# Patient Record
Sex: Male | Born: 1970 | Race: Black or African American | Hispanic: No | Smoking: Never smoker
Health system: Southern US, Community
[De-identification: ages and names within clinical notes are randomized; demographics above are authoritative.]

## PROBLEM LIST (undated history)

## (undated) DIAGNOSIS — I10 Essential (primary) hypertension: Secondary | ICD-10-CM

## (undated) DIAGNOSIS — E119 Type 2 diabetes mellitus without complications: Secondary | ICD-10-CM

## (undated) HISTORY — DX: Type 2 diabetes mellitus without complications: E11.9

---

## 1998-06-23 ENCOUNTER — Emergency Department (HOSPITAL_COMMUNITY): Admission: EM | Admit: 1998-06-23 | Discharge: 1998-06-23 | Payer: Self-pay | Admitting: Emergency Medicine

## 1998-06-23 ENCOUNTER — Encounter: Payer: Self-pay | Admitting: Emergency Medicine

## 1998-10-08 ENCOUNTER — Emergency Department (HOSPITAL_COMMUNITY): Admission: EM | Admit: 1998-10-08 | Discharge: 1998-10-08 | Payer: Self-pay | Admitting: Emergency Medicine

## 2001-10-24 ENCOUNTER — Emergency Department (HOSPITAL_COMMUNITY): Admission: EM | Admit: 2001-10-24 | Discharge: 2001-10-24 | Payer: Self-pay | Admitting: *Deleted

## 2001-10-24 ENCOUNTER — Encounter: Payer: Self-pay | Admitting: *Deleted

## 2002-05-22 ENCOUNTER — Ambulatory Visit (HOSPITAL_COMMUNITY): Admission: RE | Admit: 2002-05-22 | Discharge: 2002-05-22 | Payer: Self-pay | Admitting: Family Medicine

## 2002-05-22 ENCOUNTER — Encounter: Payer: Self-pay | Admitting: Family Medicine

## 2003-02-10 ENCOUNTER — Emergency Department (HOSPITAL_COMMUNITY): Admission: EM | Admit: 2003-02-10 | Discharge: 2003-02-10 | Payer: Self-pay | Admitting: Emergency Medicine

## 2003-02-26 ENCOUNTER — Emergency Department (HOSPITAL_COMMUNITY): Admission: EM | Admit: 2003-02-26 | Discharge: 2003-02-26 | Payer: Self-pay | Admitting: Internal Medicine

## 2003-03-09 ENCOUNTER — Emergency Department (HOSPITAL_COMMUNITY): Admission: EM | Admit: 2003-03-09 | Discharge: 2003-03-09 | Payer: Self-pay | Admitting: *Deleted

## 2003-03-09 ENCOUNTER — Encounter: Payer: Self-pay | Admitting: *Deleted

## 2003-03-13 ENCOUNTER — Encounter: Payer: Self-pay | Admitting: *Deleted

## 2003-03-13 ENCOUNTER — Ambulatory Visit (HOSPITAL_COMMUNITY): Admission: RE | Admit: 2003-03-13 | Discharge: 2003-03-13 | Payer: Self-pay | Admitting: *Deleted

## 2004-07-31 ENCOUNTER — Emergency Department (HOSPITAL_COMMUNITY): Admission: EM | Admit: 2004-07-31 | Discharge: 2004-07-31 | Payer: Self-pay | Admitting: Emergency Medicine

## 2005-01-12 ENCOUNTER — Emergency Department (HOSPITAL_COMMUNITY): Admission: EM | Admit: 2005-01-12 | Discharge: 2005-01-12 | Payer: Self-pay | Admitting: Emergency Medicine

## 2005-07-01 ENCOUNTER — Emergency Department (HOSPITAL_COMMUNITY): Admission: EM | Admit: 2005-07-01 | Discharge: 2005-07-01 | Payer: Self-pay | Admitting: Emergency Medicine

## 2005-11-08 ENCOUNTER — Emergency Department (HOSPITAL_COMMUNITY): Admission: EM | Admit: 2005-11-08 | Discharge: 2005-11-08 | Payer: Self-pay | Admitting: Emergency Medicine

## 2005-12-18 ENCOUNTER — Emergency Department (HOSPITAL_COMMUNITY): Admission: EM | Admit: 2005-12-18 | Discharge: 2005-12-18 | Payer: Self-pay | Admitting: Emergency Medicine

## 2006-04-24 ENCOUNTER — Emergency Department (HOSPITAL_COMMUNITY): Admission: EM | Admit: 2006-04-24 | Discharge: 2006-04-24 | Payer: Self-pay | Admitting: Emergency Medicine

## 2006-08-28 ENCOUNTER — Emergency Department (HOSPITAL_COMMUNITY): Admission: EM | Admit: 2006-08-28 | Discharge: 2006-08-28 | Payer: Self-pay | Admitting: Emergency Medicine

## 2007-01-08 ENCOUNTER — Emergency Department (HOSPITAL_COMMUNITY): Admission: EM | Admit: 2007-01-08 | Discharge: 2007-01-08 | Payer: Self-pay | Admitting: Emergency Medicine

## 2007-03-27 ENCOUNTER — Emergency Department (HOSPITAL_COMMUNITY): Admission: EM | Admit: 2007-03-27 | Discharge: 2007-03-27 | Payer: Self-pay | Admitting: Emergency Medicine

## 2007-09-04 ENCOUNTER — Emergency Department (HOSPITAL_COMMUNITY): Admission: EM | Admit: 2007-09-04 | Discharge: 2007-09-04 | Payer: Self-pay | Admitting: Emergency Medicine

## 2007-09-22 ENCOUNTER — Emergency Department (HOSPITAL_COMMUNITY): Admission: EM | Admit: 2007-09-22 | Discharge: 2007-09-22 | Payer: Self-pay | Admitting: Emergency Medicine

## 2007-11-28 ENCOUNTER — Emergency Department (HOSPITAL_COMMUNITY): Admission: EM | Admit: 2007-11-28 | Discharge: 2007-11-28 | Payer: Self-pay | Admitting: Emergency Medicine

## 2007-12-30 ENCOUNTER — Emergency Department (HOSPITAL_COMMUNITY): Admission: EM | Admit: 2007-12-30 | Discharge: 2007-12-30 | Payer: Self-pay | Admitting: Emergency Medicine

## 2008-01-22 ENCOUNTER — Emergency Department (HOSPITAL_COMMUNITY): Admission: EM | Admit: 2008-01-22 | Discharge: 2008-01-22 | Payer: Self-pay | Admitting: Emergency Medicine

## 2008-01-31 ENCOUNTER — Emergency Department (HOSPITAL_COMMUNITY): Admission: EM | Admit: 2008-01-31 | Discharge: 2008-01-31 | Payer: Self-pay | Admitting: Emergency Medicine

## 2008-03-09 ENCOUNTER — Emergency Department (HOSPITAL_COMMUNITY): Admission: EM | Admit: 2008-03-09 | Discharge: 2008-03-09 | Payer: Self-pay | Admitting: Emergency Medicine

## 2008-05-05 ENCOUNTER — Emergency Department (HOSPITAL_COMMUNITY): Admission: EM | Admit: 2008-05-05 | Discharge: 2008-05-05 | Payer: Self-pay | Admitting: Emergency Medicine

## 2008-08-14 ENCOUNTER — Emergency Department (HOSPITAL_COMMUNITY): Admission: EM | Admit: 2008-08-14 | Discharge: 2008-08-15 | Payer: Self-pay | Admitting: Emergency Medicine

## 2008-08-17 ENCOUNTER — Emergency Department (HOSPITAL_COMMUNITY): Admission: EM | Admit: 2008-08-17 | Discharge: 2008-08-17 | Payer: Self-pay | Admitting: Emergency Medicine

## 2008-09-21 ENCOUNTER — Emergency Department (HOSPITAL_COMMUNITY): Admission: EM | Admit: 2008-09-21 | Discharge: 2008-09-21 | Payer: Self-pay | Admitting: Emergency Medicine

## 2008-12-18 ENCOUNTER — Emergency Department (HOSPITAL_COMMUNITY): Admission: EM | Admit: 2008-12-18 | Discharge: 2008-12-18 | Payer: Self-pay | Admitting: Emergency Medicine

## 2009-01-30 ENCOUNTER — Emergency Department (HOSPITAL_COMMUNITY): Admission: EM | Admit: 2009-01-30 | Discharge: 2009-01-30 | Payer: Self-pay | Admitting: Emergency Medicine

## 2009-04-09 ENCOUNTER — Emergency Department (HOSPITAL_COMMUNITY): Admission: EM | Admit: 2009-04-09 | Discharge: 2009-04-09 | Payer: Self-pay | Admitting: Emergency Medicine

## 2009-06-10 ENCOUNTER — Emergency Department (HOSPITAL_COMMUNITY): Admission: EM | Admit: 2009-06-10 | Discharge: 2009-06-10 | Payer: Self-pay | Admitting: Emergency Medicine

## 2009-07-15 ENCOUNTER — Emergency Department (HOSPITAL_COMMUNITY): Admission: EM | Admit: 2009-07-15 | Discharge: 2009-07-15 | Payer: Self-pay | Admitting: Emergency Medicine

## 2009-07-23 ENCOUNTER — Emergency Department (HOSPITAL_COMMUNITY): Admission: EM | Admit: 2009-07-23 | Discharge: 2009-07-23 | Payer: Self-pay | Admitting: Emergency Medicine

## 2009-07-25 ENCOUNTER — Emergency Department (HOSPITAL_COMMUNITY): Admission: EM | Admit: 2009-07-25 | Discharge: 2009-07-25 | Payer: Self-pay | Admitting: Emergency Medicine

## 2009-07-26 ENCOUNTER — Emergency Department (HOSPITAL_COMMUNITY): Admission: EM | Admit: 2009-07-26 | Discharge: 2009-07-26 | Payer: Self-pay | Admitting: Emergency Medicine

## 2009-07-28 ENCOUNTER — Emergency Department (HOSPITAL_COMMUNITY): Admission: EM | Admit: 2009-07-28 | Discharge: 2009-07-28 | Payer: Self-pay | Admitting: Emergency Medicine

## 2009-09-26 ENCOUNTER — Emergency Department (HOSPITAL_COMMUNITY): Admission: EM | Admit: 2009-09-26 | Discharge: 2009-09-26 | Payer: Self-pay | Admitting: Emergency Medicine

## 2009-11-07 ENCOUNTER — Emergency Department (HOSPITAL_COMMUNITY): Admission: EM | Admit: 2009-11-07 | Discharge: 2009-11-07 | Payer: Self-pay | Admitting: Emergency Medicine

## 2009-12-14 ENCOUNTER — Emergency Department (HOSPITAL_COMMUNITY): Admission: EM | Admit: 2009-12-14 | Discharge: 2009-12-14 | Payer: Self-pay | Admitting: Emergency Medicine

## 2009-12-31 ENCOUNTER — Emergency Department (HOSPITAL_COMMUNITY): Admission: EM | Admit: 2009-12-31 | Discharge: 2009-12-31 | Payer: Self-pay | Admitting: Emergency Medicine

## 2010-01-24 ENCOUNTER — Emergency Department (HOSPITAL_COMMUNITY): Admission: EM | Admit: 2010-01-24 | Discharge: 2010-01-24 | Payer: Self-pay | Admitting: Emergency Medicine

## 2010-02-21 ENCOUNTER — Emergency Department (HOSPITAL_COMMUNITY): Admission: EM | Admit: 2010-02-21 | Discharge: 2010-02-21 | Payer: Self-pay | Admitting: Emergency Medicine

## 2010-04-25 ENCOUNTER — Ambulatory Visit: Payer: Self-pay | Admitting: Family Medicine

## 2010-04-25 DIAGNOSIS — I10 Essential (primary) hypertension: Secondary | ICD-10-CM | POA: Insufficient documentation

## 2010-04-25 DIAGNOSIS — J45909 Unspecified asthma, uncomplicated: Secondary | ICD-10-CM | POA: Insufficient documentation

## 2010-04-25 DIAGNOSIS — IMO0002 Reserved for concepts with insufficient information to code with codable children: Secondary | ICD-10-CM | POA: Insufficient documentation

## 2010-05-02 ENCOUNTER — Encounter: Payer: Self-pay | Admitting: Physician Assistant

## 2010-05-15 ENCOUNTER — Emergency Department (HOSPITAL_COMMUNITY): Admission: EM | Admit: 2010-05-15 | Discharge: 2010-05-16 | Payer: Self-pay | Admitting: Emergency Medicine

## 2010-07-11 ENCOUNTER — Ambulatory Visit: Payer: Self-pay | Admitting: Family Medicine

## 2010-07-11 ENCOUNTER — Encounter: Payer: Self-pay | Admitting: Physician Assistant

## 2010-07-11 DIAGNOSIS — J019 Acute sinusitis, unspecified: Secondary | ICD-10-CM | POA: Insufficient documentation

## 2010-07-11 DIAGNOSIS — J209 Acute bronchitis, unspecified: Secondary | ICD-10-CM | POA: Insufficient documentation

## 2010-07-14 ENCOUNTER — Encounter: Payer: Self-pay | Admitting: Family Medicine

## 2010-09-12 ENCOUNTER — Ambulatory Visit: Payer: Self-pay | Admitting: Family Medicine

## 2010-09-19 ENCOUNTER — Emergency Department (HOSPITAL_COMMUNITY)
Admission: EM | Admit: 2010-09-19 | Discharge: 2010-09-19 | Payer: Self-pay | Source: Home / Self Care | Admitting: Emergency Medicine

## 2010-09-21 ENCOUNTER — Emergency Department (HOSPITAL_COMMUNITY)
Admission: EM | Admit: 2010-09-21 | Discharge: 2010-09-22 | Payer: Self-pay | Source: Home / Self Care | Admitting: Emergency Medicine

## 2010-10-25 NOTE — Assessment & Plan Note (Signed)
Summary: COLD FEVER THROAT   Vital Signs:  Patient profile:   40 year old male Height:      68 inches Weight:      243.75 pounds BMI:     37.20 O2 Sat:      97 % on Room air Temp:     99.0 degrees F oral Pulse rate:   107 / minute Resp:     16 per minute BP sitting:   110 / 78  (left arm)  Vitals Entered By: Louie Casa CMA (July 11, 2010 3:01 PM) CC: Cough, sore throat, headache, fever off and on over the weekend. No appetite   CC:  Cough, sore throat, headache, and fever off and on over the weekend. No appetite.  History of Present Illness: Pt states he developed cold syptoms 5 days ago.  Temp 101 degrees, but no fever today.  + HA, nasal congestion yellow, prod cough yellow phlegm.  + sore throat.  No energy.  No myalgias. Tried an over the counter cough medicine and no relief.  Is not sleeping well due to the cough. Throat is sore first thing in the morning and at night, but not during the day.  Hx of asthma.  Has had some wheezing.  using inhaler as needed and helps. Hx of HTN.  Taking meds as prescribed.  No chest pain or pressure.  Allergies (verified): 1)  ! Diona Fanti  Past History:  Past medical history reviewed for relevance to current acute and chronic problems.  Past Medical History: Reviewed history from 04/25/2010 and no changes required. Asthma Hypertension  Review of Systems General:  Complains of chills, fatigue, and fever. ENT:  Complains of nasal congestion, postnasal drainage, sinus pressure, and sore throat; denies earache. CV:  Denies chest pain or discomfort. Resp:  Complains of cough, sputum productive, and wheezing; denies shortness of breath.  Physical Exam  General:  Well-developed,well-nourished,in no acute distress; alert,appropriate and cooperative throughout examination Head:  Normocephalic and atraumatic without obvious abnormalities. No apparent alopecia or balding. Ears:  External ear exam shows no significant lesions or deformities.   Otoscopic examination reveals clear canals, tympanic membranes are intact bilaterally without bulging, retraction, inflammation or discharge. Hearing is grossly normal bilaterally. Nose:  External nasal examination shows no deformity or inflammation. Nasal mucosa are pink and moist without lesions or exudates. Mouth:  good dentition and pharyngeal erythema. and cobblestoned.  Neck:  No deformities, masses, or tenderness noted. Lungs:  Normal respiratory effort, chest expands symmetrically. Lungs are clear to auscultation, no crackles or wheezes. Heart:  Normal rate and regular rhythm. S1 and S2 normal without gallop, murmur, click, rub or other extra sounds. Cervical Nodes:  No lymphadenopathy noted Psych:  Cognition and judgment appear intact. Alert and cooperative with normal attention span and concentration. No apparent delusions, illusions, hallucinations   Impression & Recommendations:  Problem # 1:  SINUSITIS, ACUTE (ICD-461.9) Assessment New  His updated medication list for this problem includes:    Promethazine-codeine 6.25-10 Mg/44ml Syrp (Promethazine-codeine) .Marland Kitchen... Take one tsp every 6-8 hrs as needed cough    Doxycycline Hyclate 100 Mg Caps (Doxycycline hyclate) .Marland Kitchen... Take one two times a day x 10 days  Problem # 2:  BRONCHITIS, ACUTE (ICD-466.0) Assessment: New  His updated medication list for this problem includes:    Proair Hfa 108 (90 Base) Mcg/act Aers (Albuterol sulfate) .Marland Kitchen..Marland Kitchen Two puffs every 4 hrs as needed    Promethazine-codeine 6.25-10 Mg/51ml Syrp (Promethazine-codeine) .Marland Kitchen... Take one tsp every 6-8 hrs  as needed cough    Doxycycline Hyclate 100 Mg Caps (Doxycycline hyclate) .Marland Kitchen... Take one two times a day x 10 days  Orders: Depo- Medrol 80mg  (J1040) Admin of Therapeutic Inj  intramuscular or subcutaneous [id number])  Problem # 3:  ASTHMA (ICD-493.90) Assessment: Comment Only  His updated medication list for this problem includes:    Proair Hfa 108 (90 Base) Mcg/act  Aers (Albuterol sulfate) .Marland Kitchen..Marland Kitchen Two puffs every 4 hrs as needed  Problem # 4:  HYPERTENSION (ICD-401.9) Assessment: Comment Only  His updated medication list for this problem includes:    Ziac 2.5-6.25 Mg Tabs (Bisoprolol-hydrochlorothiazide) ..... One tab by mouth once daily  Complete Medication List: 1)  Ziac 2.5-6.25 Mg Tabs (Bisoprolol-hydrochlorothiazide) .... One tab by mouth once daily 2)  Proair Hfa 108 (90 Base) Mcg/act Aers (Albuterol sulfate) .... Two puffs every 4 hrs as needed 3)  Promethazine-codeine 6.25-10 Mg/74ml Syrp (Promethazine-codeine) .... Take one tsp every 6-8 hrs as needed cough 4)  Doxycycline Hyclate 100 Mg Caps (Doxycycline hyclate) .... Take one two times a day x 10 days  Patient Instructions: 1)  Follow up as planned in December. 2)  I have prescribed an antibiotic and cough medicine for you. 3)  Continue your inhaler as needed. 4)  Return sooner if symptoms persist or worsen. Prescriptions: DOXYCYCLINE HYCLATE 100 MG CAPS (DOXYCYCLINE HYCLATE) take one two times a day x 10 days  #20 x 0   Entered and Authorized by:   Kennith Gain PA   Signed by:   Kennith Gain PA on 07/11/2010   Method used:   Electronically to        [facility] (retail)       [address]       [place], [place]  [address]       Ph: [phone]       Fax: [phone]   [id number] [place] 6.25-10 MG/5ML SYRP ([place]) take one tsp every 6-8 hrs as needed cough  #6 oz x 0   Entered and Authorized by:   Kennith Gain PA   Signed by:   Kennith Gain PA on 07/11/2010   Method used:   Printed then faxed to ...       [facility]  [address] [facility] (retail)       [place], [place]  [address]       Ph: [phone]       Fax: [phone]   RxIDPI:[id number]    Medication Administration  Injection # 1:    Medication: Depo- Medrol 80mg     Diagnosis: BRONCHITIS, ACUTE (ICD-466.0)     Route: IM    Site: RUOQ gluteus    Exp Date: 02/2011    Lot #: [id number]    Mfr: Pharmacia    Comments: 80 mg given    Patient tolerated injection without complications    Given by: Louie Casa CMA (July 11, 2010 4:09 PM)  Orders Added: 1)  Depo- Medrol 80mg  [J1040] 2)  Admin of Therapeutic Inj  intramuscular or subcutaneous [96372] 3)  Est. Patient Level IV [id number]

## 2010-10-25 NOTE — Letter (Signed)
Summary: Work [facility]   [place], [place] [address]   Phone: [phone]  Fax: [phone]    Today's Date: July 11, 2010  Name of Patient: Luis Wood  The above named patient had a medical visit today at:  3:00 pm.  Please take this into consideration when reviewing the time away from work/school.    Special Instructions:  [  ] None  [* ] To be off the remainder of today, returning to the normal work / school schedule tomorrow.  [  ] To be off until the next scheduled appointment on ______________________.  [  ] Other ________________________________________________________________ ________________________________________________________________________   Sincerely yours,   Kennith Gain PA

## 2010-10-25 NOTE — Miscellaneous (Signed)
Summary: INS CARD  INS CARD   Imported By: Dierdre Harness 05/02/2010 16:12:50  _____________________________________________________________________  External Attachment:    Type:   Image     Comment:   External Document

## 2010-10-25 NOTE — Letter (Signed)
Summary: Letter to resch. appt.  Letter to resch. appt.   Imported By: Eliezer Mccoy 07/15/2010 15:59:16  _____________________________________________________________________  External Attachment:    Type:   Image     Comment:   External Document
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
## 2010-10-25 NOTE — Letter (Signed)
Summary: application for membership  application for membership   Imported By: Eliezer Mccoy 04/26/2010 14:19:20  _____________________________________________________________________  External Attachment:    Type:   Image     Comment:   External Document
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
## 2010-10-27 NOTE — Letter (Signed)
Summary: 1ST MISSED LETTER  1ST MISSED LETTER   Imported By: Dierdre Harness 09/13/2010 13:32:02  _____________________________________________________________________  External Attachment:    Type:   Image     Comment:   External Document

## 2010-12-05 LAB — DIFFERENTIAL
Basophils Absolute: 0 10*3/uL (ref 0.0–0.1)
Eosinophils Relative: 0 % (ref 0–5)
Lymphs Abs: 2.5 10*3/uL (ref 0.7–4.0)
Monocytes Absolute: 0.5 10*3/uL (ref 0.1–1.0)
Neutrophils Relative %: 60 % (ref 43–77)

## 2010-12-05 LAB — CBC
Hemoglobin: 13.7 g/dL (ref 13.0–17.0)
RDW: 14 % (ref 11.5–15.5)
WBC: 7.7 10*3/uL (ref 4.0–10.5)

## 2010-12-05 LAB — BASIC METABOLIC PANEL
Creatinine, Ser: 1.05 mg/dL (ref 0.4–1.5)
GFR calc non Af Amer: >60 mL/min (ref >60)
Glucose, Bld: 230 mg/dL — ABNORMAL HIGH (ref 70–99)
Potassium: 4.2 mEq/L (ref 3.5–5.1)

## 2010-12-05 LAB — BRAIN NATRIURETIC PEPTIDE: Pro B Natriuretic peptide (BNP): <30 pg/mL (ref 0.0–100.0)

## 2010-12-05 LAB — GLUCOSE, CAPILLARY

## 2010-12-05 LAB — POCT CARDIAC MARKERS: Myoglobin, poc: 65.8 ng/mL (ref 12–200)

## 2010-12-14 LAB — BASIC METABOLIC PANEL
Calcium: 9.5 mg/dL (ref 8.4–10.5)
GFR calc Af Amer: >60 mL/min (ref >60)
GFR calc non Af Amer: >60 mL/min (ref >60)
Sodium: 135 mEq/L (ref 135–145)

## 2010-12-14 LAB — CBC
HCT: 41.6 % (ref 39.0–52.0)
Hemoglobin: 13.9 g/dL (ref 13.0–17.0)
MCHC: 33.3 g/dL (ref 30.0–36.0)
MCV: 90.2 fL (ref 78.0–100.0)
RDW: 12.8 % (ref 11.5–15.5)
WBC: 9.7 10*3/uL (ref 4.0–10.5)

## 2010-12-14 LAB — URINALYSIS, ROUTINE W REFLEX MICROSCOPIC
Bilirubin Urine: NEGATIVE
Hgb urine dipstick: NEGATIVE
Specific Gravity, Urine: >1.03 — ABNORMAL HIGH (ref 1.005–1.030)
pH: 5.5 (ref 5.0–8.0)

## 2010-12-14 LAB — DIFFERENTIAL
Basophils Relative: 0 % (ref 0–1)
Eosinophils Absolute: 0.1 10*3/uL (ref 0.0–0.7)
Monocytes Absolute: 0.7 10*3/uL (ref 0.1–1.0)
Monocytes Relative: 7 % (ref 3–12)
Neutrophils Relative %: 68 % (ref 43–77)

## 2010-12-29 LAB — CULTURE, ROUTINE-ABSCESS

## 2011-01-09 ENCOUNTER — Emergency Department (HOSPITAL_COMMUNITY)
Admission: EM | Admit: 2011-01-09 | Discharge: 2011-01-09 | Disposition: A | Discharge status: Home / Self Care | Payer: BC Managed Care – PPO | Attending: Emergency Medicine | Admitting: Emergency Medicine

## 2011-01-09 DIAGNOSIS — J019 Acute sinusitis, unspecified: Secondary | ICD-10-CM | POA: Insufficient documentation

## 2011-04-17 ENCOUNTER — Emergency Department (HOSPITAL_COMMUNITY)
Admission: EM | Admit: 2011-04-17 | Discharge: 2011-04-17 | Disposition: A | Discharge status: Home / Self Care | Payer: BC Managed Care – PPO | Attending: Emergency Medicine | Admitting: Emergency Medicine

## 2011-04-17 ENCOUNTER — Encounter: Payer: Self-pay | Admitting: Emergency Medicine

## 2011-04-17 DIAGNOSIS — J329 Chronic sinusitis, unspecified: Secondary | ICD-10-CM | POA: Insufficient documentation

## 2011-04-17 DIAGNOSIS — H9209 Otalgia, unspecified ear: Secondary | ICD-10-CM | POA: Insufficient documentation

## 2011-04-17 DIAGNOSIS — H5710 Ocular pain, unspecified eye: Secondary | ICD-10-CM | POA: Insufficient documentation

## 2011-04-17 DIAGNOSIS — R062 Wheezing: Secondary | ICD-10-CM | POA: Insufficient documentation

## 2011-04-17 DIAGNOSIS — R51 Headache: Secondary | ICD-10-CM | POA: Insufficient documentation

## 2011-04-17 DIAGNOSIS — R0789 Other chest pain: Secondary | ICD-10-CM | POA: Insufficient documentation

## 2011-04-17 DIAGNOSIS — R059 Cough, unspecified: Secondary | ICD-10-CM | POA: Insufficient documentation

## 2011-04-17 DIAGNOSIS — R05 Cough: Secondary | ICD-10-CM | POA: Insufficient documentation

## 2011-04-17 HISTORY — DX: Essential (primary) hypertension: I10

## 2011-04-17 MED ORDER — PREDNISONE 10 MG PO TABS: ORAL_TABLET | ORAL | Status: AC

## 2011-04-17 MED ORDER — ALBUTEROL SULFATE HFA 108 (90 BASE) MCG/ACT IN AERS: 2.0000 | INHALATION_SPRAY | RESPIRATORY_TRACT | Status: DC | PRN

## 2011-04-17 MED ORDER — AMOXICILLIN 500 MG PO CAPS: 500.0000 mg | ORAL_CAPSULE | Freq: Three times a day (TID) | ORAL | Status: AC

## 2011-04-17 NOTE — ED Notes (Signed)
Pt c/o pain to left side of head, left ear, bilateral eye pressure, states that he feels like he has a sinus infection, pt also states that he feels like he has been having asthma attacks,

## 2011-04-17 NOTE — ED Notes (Signed)
Pt c/o headache, eye pain, ear pain and nose pain x couple of days.

## 2011-04-17 NOTE — ED Notes (Signed)
Pt was given work note to return to work 04/18/2011 with no restrictions,

## 2011-04-17 NOTE — ED Provider Notes (Signed)
History     Chief Complaint  Patient presents with  . Headache  . Otalgia  . Eye Pain   HPI Comments: Patient with hx of asthma, c/o intermittent frontal headache, bilateral ear pain and nasal congestion and wheezing for two days.  Reports hx of similar sx's in the past.  Describes the headache as throbbing and behind both eyes.  He denies neck pain, stiffness, visual changes or fever.  Also denies vomiting.  States he has ran out of his inhaler also  Patient is a 40 y.o. male presenting with URI. The history is provided by the patient.  URI The primary symptoms include headaches, ear pain, cough and wheezing. Primary symptoms do not include fever, sore throat, swollen glands, abdominal pain, nausea, vomiting, arthralgias or rash. The current episode started 2 days ago. This is a recurrent problem. The problem has been gradually worsening.  The headache began 2 days ago. The headache developed gradually. Headache is a recurrent problem. The headache is present intermittently. The headache is associated with eye pain. The headache is not associated with aura, photophobia, decreased vision, stiff neck, neck stiffness, weakness or loss of balance.  The ear pain began 2 days ago. Ear pain is a recurrent problem. The ear pain has been gradually worsening since its onset. Both ears are affected. The pain is mild.    The cough began 2 days ago. The cough is recurrent. The cough is non-productive and hacking. The sputum is clear.  Wheezing began today. Wheezing occurs frequently. The wheezing has been gradually worsening since its onset. The patient's medical history is significant for asthma.  Symptoms associated with the illness include sinus pressure, congestion and rhinorrhea. The illness is not associated with chills or facial pain.    Past Medical History  Diagnosis Date  . Hypertension   . Asthma     History reviewed. No pertinent past surgical history.  History reviewed. No pertinent  family history.  History  Substance Use Topics  . Smoking status: Never Smoker   . Smokeless tobacco: Not on file  . Alcohol Use: No      Review of Systems  Constitutional: Negative for fever, chills, activity change and appetite change.  HENT: Positive for ear pain, congestion, rhinorrhea and sinus pressure. Negative for nosebleeds, sore throat, trouble swallowing, neck pain and neck stiffness.   Eyes: Positive for pain. Negative for photophobia.  Respiratory: Positive for cough, chest tightness and wheezing. Negative for shortness of breath.   Cardiovascular: Negative.   Gastrointestinal: Negative for nausea, vomiting, abdominal pain and diarrhea.  Genitourinary: Negative for dysuria and frequency.  Musculoskeletal: Negative.  Negative for arthralgias.  Skin: Negative for rash.  Neurological: Positive for headaches. Negative for dizziness, speech difficulty, weakness, numbness and loss of balance.  Hematological: Does not bruise/bleed easily.    Physical Exam  BP 140/83  Pulse 103  Temp(Src) 98.7 F (37.1 C) (Oral)  Resp 20  Ht 5\' 7"  (1.702 m)  Wt 227 lb (102.967 kg)  BMI 35.55 kg/m2  SpO2 99%  Physical Exam  Nursing note and vitals reviewed. Constitutional: He is oriented to person, place, and time. He appears well-developed and well-nourished.  Non-toxic appearance. No distress.  HENT:  Head: Normocephalic and atraumatic.  Right Ear: External ear normal.  Left Ear: External ear normal.  Mouth/Throat: Oropharynx is clear and moist.        nasal mucosa is edematous bilaterally  Eyes: EOM are normal. Pupils are equal, round, and reactive to light.  Neck: Normal range of motion. Neck supple. No tracheal tenderness present. No Brudzinski's sign and no Kernig's sign noted. No thyromegaly present.  Cardiovascular: Normal rate, regular rhythm and normal heart sounds.   Pulmonary/Chest: No respiratory distress. He has no wheezes. He has no rales. He exhibits no tenderness.    Abdominal: Soft. Bowel sounds are normal.  Musculoskeletal: Normal range of motion.  Lymphadenopathy:    He has no cervical adenopathy.  Neurological: He is alert and oriented to person, place, and time. No cranial nerve deficit. He exhibits normal muscle tone.  Skin: Skin is warm and dry.  Psychiatric: He has a normal mood and affect.    ED Course  Procedures  MDM    Patient is alert.  NAd.  Vitals stable, no meningeal signs, no focal neuro deficits.  Ambulates well.  No facial droop.  Non-toxic appearing.  Sx's likely related to sinusitis      Raima Geathers L. [place], [place] 04/17/11 1736

## 2011-04-18 NOTE — ED Provider Notes (Signed)
Medical screening examination/treatment/procedure(s) were performed by non-physician practitioner and as supervising physician I was immediately available for consultation/collaboration.   Shiane Wenberg B. Karle Starch, MD 04/18/11 1404

## 2011-06-03 ENCOUNTER — Emergency Department (HOSPITAL_COMMUNITY)
Admission: EM | Admit: 2011-06-03 | Discharge: 2011-06-03 | Disposition: A | Discharge status: Home / Self Care | Payer: BC Managed Care – PPO | Attending: Emergency Medicine | Admitting: Emergency Medicine

## 2011-06-03 ENCOUNTER — Encounter (HOSPITAL_COMMUNITY): Payer: Self-pay

## 2011-06-03 DIAGNOSIS — R51 Headache: Secondary | ICD-10-CM | POA: Insufficient documentation

## 2011-06-03 DIAGNOSIS — J069 Acute upper respiratory infection, unspecified: Secondary | ICD-10-CM

## 2011-06-03 DIAGNOSIS — I10 Essential (primary) hypertension: Secondary | ICD-10-CM | POA: Insufficient documentation

## 2011-06-03 DIAGNOSIS — R05 Cough: Secondary | ICD-10-CM | POA: Insufficient documentation

## 2011-06-03 DIAGNOSIS — H9209 Otalgia, unspecified ear: Secondary | ICD-10-CM | POA: Insufficient documentation

## 2011-06-03 DIAGNOSIS — R059 Cough, unspecified: Secondary | ICD-10-CM | POA: Insufficient documentation

## 2011-06-03 MED ORDER — OXYMETAZOLINE HCL 0.05 % NA SOLN
1.0000 | Freq: Once | NASAL | Status: AC
  Administered 2011-06-03: 1 via NASAL
  Filled 2011-06-03: qty 15

## 2011-06-03 MED ORDER — IBUPROFEN 800 MG PO TABS
800.0000 mg | ORAL_TABLET | Freq: Once | ORAL | Status: AC
Start: 2011-06-03 — End: 2011-06-03
  Administered 2011-06-03: 800 mg via ORAL
  Filled 2011-06-03: qty 1

## 2011-06-03 MED ORDER — ALBUTEROL SULFATE HFA 108 (90 BASE) MCG/ACT IN AERS: 2.0000 | INHALATION_SPRAY | RESPIRATORY_TRACT | Status: DC | PRN

## 2011-06-03 MED ORDER — IBUPROFEN 800 MG PO TABS: 800.0000 mg | ORAL_TABLET | Freq: Once | ORAL | Status: AC
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
## 2011-06-03 NOTE — ED Notes (Signed)
Pt ambulated to room with steady gait. Denies weakness in extremities. Speech is clear.

## 2011-06-03 NOTE — ED Notes (Signed)
Pt reports headache for the past 4 days.  Pt reports pressure under his eyes, congestion, and nasal drainage.

## 2011-07-28 ENCOUNTER — Emergency Department (HOSPITAL_COMMUNITY)
Admission: EM | Admit: 2011-07-28 | Discharge: 2011-07-28 | Disposition: A | Discharge status: Home / Self Care | Payer: BC Managed Care – PPO | Attending: Emergency Medicine | Admitting: Emergency Medicine

## 2011-07-28 ENCOUNTER — Encounter (HOSPITAL_COMMUNITY): Payer: Self-pay | Admitting: *Deleted

## 2011-07-28 ENCOUNTER — Emergency Department (HOSPITAL_COMMUNITY): Payer: BC Managed Care – PPO

## 2011-07-28 DIAGNOSIS — S161XXA Strain of muscle, fascia and tendon at neck level, initial encounter: Secondary | ICD-10-CM

## 2011-07-28 DIAGNOSIS — S39012A Strain of muscle, fascia and tendon of lower back, initial encounter: Secondary | ICD-10-CM

## 2011-07-28 DIAGNOSIS — S335XXA Sprain of ligaments of lumbar spine, initial encounter: Secondary | ICD-10-CM | POA: Insufficient documentation

## 2011-07-28 DIAGNOSIS — S139XXA Sprain of joints and ligaments of unspecified parts of neck, initial encounter: Secondary | ICD-10-CM | POA: Insufficient documentation

## 2011-07-28 MED ORDER — HYDROCODONE-ACETAMINOPHEN 5-325 MG PO TABS: 1.0000 | ORAL_TABLET | ORAL | Status: AC | PRN

## 2011-07-28 MED ORDER — CARISOPRODOL 350 MG PO TABS: 350.0000 mg | ORAL_TABLET | Freq: Three times a day (TID) | ORAL | Status: AC
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
## 2011-07-28 NOTE — ED Notes (Signed)
Pt a/ox4. Resp even and unlabored. NAD at this time. D/C instructions and Rx x 2 reviewed with pt. Pt verbalized understanding. Pt ambulated to POV with steady gate.
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
## 2011-07-30 NOTE — ED Provider Notes (Signed)
Medical screening examination/treatment/procedure(s) were performed by non-physician practitioner and as supervising physician I was immediately available for consultation/collaboration.  Nat Christen, MD 07/30/11 [phone]

## 2011-08-21 ENCOUNTER — Emergency Department (HOSPITAL_COMMUNITY): Payer: BC Managed Care – PPO

## 2011-08-21 ENCOUNTER — Emergency Department (HOSPITAL_COMMUNITY)
Admission: EM | Admit: 2011-08-21 | Discharge: 2011-08-21 | Discharge status: Against Medical Advice | Payer: BC Managed Care – PPO | Attending: Emergency Medicine | Admitting: Emergency Medicine

## 2011-08-21 ENCOUNTER — Encounter (HOSPITAL_COMMUNITY): Payer: Self-pay | Admitting: *Deleted

## 2011-08-21 DIAGNOSIS — R739 Hyperglycemia, unspecified: Secondary | ICD-10-CM

## 2011-08-21 DIAGNOSIS — J111 Influenza due to unidentified influenza virus with other respiratory manifestations: Secondary | ICD-10-CM | POA: Insufficient documentation

## 2011-08-21 DIAGNOSIS — R509 Fever, unspecified: Secondary | ICD-10-CM | POA: Insufficient documentation

## 2011-08-21 DIAGNOSIS — R05 Cough: Secondary | ICD-10-CM | POA: Insufficient documentation

## 2011-08-21 DIAGNOSIS — R059 Cough, unspecified: Secondary | ICD-10-CM | POA: Insufficient documentation

## 2011-08-21 LAB — URINALYSIS, ROUTINE W REFLEX MICROSCOPIC
Glucose, UA: >1000 mg/dL — AB
pH: 5.5 (ref 5.0–8.0)

## 2011-08-21 LAB — BASIC METABOLIC PANEL
CO2: 28 mEq/L (ref 19–32)
Calcium: 10.1 mg/dL (ref 8.4–10.5)
Chloride: 97 mEq/L (ref 96–112)
Glucose, Bld: 303 mg/dL — ABNORMAL HIGH (ref 70–99)
Sodium: 134 mEq/L — ABNORMAL LOW (ref 135–145)

## 2011-08-21 LAB — CBC
HCT: 41.7 % (ref 39.0–52.0)
MCV: 88.5 fL (ref 78.0–100.0)
Platelets: 211 10*3/uL (ref 150–400)
RBC: 4.71 MIL/uL (ref 4.22–5.81)
WBC: 9.9 10*3/uL (ref 4.0–10.5)

## 2011-08-21 LAB — URINE MICROSCOPIC-ADD ON

## 2011-08-21 LAB — DIFFERENTIAL
Eosinophils Relative: 0 % (ref 0–5)
Lymphocytes Relative: 19 % (ref 12–46)
Lymphs Abs: 1.9 10*3/uL (ref 0.7–4.0)
Monocytes Absolute: 0.7 10*3/uL (ref 0.1–1.0)

## 2011-08-21 MED ORDER — HYDROCOD POLST-CHLORPHEN POLST 10-8 MG/5ML PO LQCR
5.0000 mL | Freq: Once | ORAL | Status: AC
  Administered 2011-08-21: 5 mL via ORAL
  Filled 2011-08-21: qty 5

## 2011-08-21 MED ORDER — ALBUTEROL SULFATE HFA 108 (90 BASE) MCG/ACT IN AERS
2.0000 | INHALATION_SPRAY | RESPIRATORY_TRACT | Status: DC
  Administered 2011-08-21: 2 via RESPIRATORY_TRACT
  Filled 2011-08-21: qty 6.7

## 2011-08-21 MED ORDER — SODIUM CHLORIDE 0.9 % IV SOLN: INTRAVENOUS | Status: DC

## 2011-08-21 MED ORDER — PREDNISONE 20 MG PO TABS: 60.0000 mg | ORAL_TABLET | Freq: Once | ORAL | Status: DC

## 2011-08-21 MED ORDER — ONDANSETRON HCL 4 MG PO TABS
4.0000 mg | ORAL_TABLET | Freq: Once | ORAL | Status: AC
  Administered 2011-08-21: 4 mg via ORAL
  Filled 2011-08-21: qty 1

## 2011-08-21 NOTE — ED Notes (Signed)
Patient with no complaints at this time. Respirations even and unlabored. Skin warm/dry. Discharge instructions reviewed with patient at this time. Patient given opportunity to voice concerns/ask questions. Patient discharged at this time and left Emergency Department with steady gait. Patient signed AMA form and understood risks in leaving.

## 2011-08-21 NOTE — ED Notes (Signed)
Pt c/o fever, coughing up yellow phlegm, vomiting and feeling weak since Friday. Pt states that his highest fever was 101 this am.

## 2011-08-21 NOTE — ED Notes (Signed)
Patient refusing IV, stating he just "wants to go home". Lily Kocher, PA made aware and will be in to talk with patient.

## 2011-10-25 ENCOUNTER — Encounter (HOSPITAL_COMMUNITY): Payer: Self-pay | Admitting: *Deleted

## 2011-10-25 ENCOUNTER — Emergency Department (HOSPITAL_COMMUNITY)
Admission: EM | Admit: 2011-10-25 | Discharge: 2011-10-25 | Disposition: A | Discharge status: Home / Self Care | Payer: BC Managed Care – PPO | Attending: Emergency Medicine | Admitting: Emergency Medicine

## 2011-10-25 DIAGNOSIS — IMO0002 Reserved for concepts with insufficient information to code with codable children: Secondary | ICD-10-CM

## 2011-10-25 DIAGNOSIS — J45909 Unspecified asthma, uncomplicated: Secondary | ICD-10-CM | POA: Insufficient documentation

## 2011-10-25 DIAGNOSIS — I10 Essential (primary) hypertension: Secondary | ICD-10-CM | POA: Insufficient documentation

## 2011-10-25 DIAGNOSIS — L02219 Cutaneous abscess of trunk, unspecified: Secondary | ICD-10-CM | POA: Insufficient documentation

## 2011-10-25 MED ORDER — LIDOCAINE HCL (PF) 1 % IJ SOLN
INTRAMUSCULAR | Status: AC
  Filled 2011-10-25: qty 5

## 2011-10-25 MED ORDER — OXYCODONE-ACETAMINOPHEN 5-325 MG PO TABS: 1.0000 | ORAL_TABLET | ORAL | Status: AC | PRN

## 2011-10-25 MED ORDER — HYDROMORPHONE HCL PF 1 MG/ML IJ SOLN
1.0000 mg | Freq: Once | INTRAMUSCULAR | Status: AC
  Administered 2011-10-25: 1 mg via INTRAVENOUS
  Filled 2011-10-25: qty 1

## 2011-10-25 MED ORDER — DIPHENHYDRAMINE HCL 50 MG/ML IJ SOLN
12.5000 mg | Freq: Once | INTRAMUSCULAR | Status: AC
  Administered 2011-10-25: 12.5 mg via INTRAVENOUS
  Filled 2011-10-25: qty 1

## 2011-10-25 MED ORDER — ONDANSETRON HCL 4 MG/2ML IJ SOLN
4.0000 mg | Freq: Once | INTRAMUSCULAR | Status: AC
  Administered 2011-10-25: 4 mg via INTRAVENOUS
  Filled 2011-10-25: qty 2

## 2011-10-25 MED ORDER — VANCOMYCIN HCL IN DEXTROSE 1-5 GM/200ML-% IV SOLN
1000.0000 mg | Freq: Once | INTRAVENOUS | Status: AC
  Administered 2011-10-25: 1000 mg via INTRAVENOUS
  Filled 2011-10-25: qty 200

## 2011-10-25 MED ORDER — AMOXICILLIN-POT CLAVULANATE 875-125 MG PO TABS: 1.0000 | ORAL_TABLET | Freq: Two times a day (BID) | ORAL | Status: AC

## 2011-10-25 MED ORDER — SODIUM CHLORIDE 0.9 % IV SOLN
Freq: Once | INTRAVENOUS | Status: AC
  Administered 2011-10-25: 1000 mL via INTRAVENOUS

## 2011-10-25 MED ORDER — DOXYCYCLINE HYCLATE 100 MG PO CAPS: 100.0000 mg | ORAL_CAPSULE | Freq: Two times a day (BID) | ORAL | Status: AC

## 2011-10-25 NOTE — ED Provider Notes (Addendum)
History     CSN: [id number]  Arrival date & time 10/25/11  1658   First MD Initiated Contact with Patient 10/25/11 1800      Chief Complaint  Patient presents with  . Abscess    (Consider location/radiation/quality/duration/timing/severity/associated sxs/prior treatment) Patient is a 41 y.o. male presenting with abscess. The history is provided by the patient.  Abscess  This is a new problem. The current episode started less than one week ago. The problem occurs continuously. The problem has been gradually worsening. The abscess is present on the groin. The problem is severe. The abscess is characterized by redness and painfulness. It is unknown what he was exposed to. Associated symptoms include anorexia, a fever and vomiting. Pertinent negatives include no cough. Past medical history comments: abscess about 6-8 months ago.. There were no sick contacts.    Past Medical History  Diagnosis Date  . Hypertension   . Asthma     History reviewed. No pertinent past surgical history.  Family History  Problem Relation Age of Onset  . Hypertension Father     History  Substance Use Topics  . Smoking status: Never Smoker   . Smokeless tobacco: Not on file  . Alcohol Use: No      Review of Systems  Constitutional: Positive for fever. Negative for activity change.       All ROS Neg except as noted in HPI  HENT: Negative for nosebleeds and neck pain.   Eyes: Negative for photophobia and discharge.  Respiratory: Negative for cough, shortness of breath and wheezing.   Cardiovascular: Negative for chest pain and palpitations.  Gastrointestinal: Positive for vomiting and anorexia. Negative for abdominal pain and blood in stool.  Genitourinary: Negative for dysuria, frequency and hematuria.  Musculoskeletal: Negative for back pain and arthralgias.  Skin: Negative.   Neurological: Negative for dizziness, seizures and speech difficulty.  Psychiatric/Behavioral: Negative for  hallucinations and confusion.    Allergies  Aspirin  Home Medications   Current Outpatient Rx  Name Route Sig Dispense Refill  . ALBUTEROL SULFATE HFA 108 (90 BASE) MCG/ACT IN AERS Inhalation Inhale 2 puffs into the lungs every 4 (four) hours as needed for wheezing. 1 Inhaler 0  . BISOPROLOL-HYDROCHLOROTHIAZIDE 2.5-6.25 MG PO TABS Oral Take 1 tablet by mouth daily.     Marland Kitchen DOXYCYCLINE HYCLATE 100 MG PO TABS Oral Take 100 mg by mouth 2 (two) times daily.       BP 150/84  Pulse 105  Temp(Src) 98.9 F (37.2 C) (Oral)  Resp 18  Ht 5\' 7"  (1.702 m)  Wt 228 lb (103.42 kg)  BMI 35.71 kg/m2  SpO2 95%  Physical Exam  Nursing note and vitals reviewed. Constitutional: He is oriented to person, place, and time. He appears well-developed and well-nourished.  Non-toxic appearance.  HENT:  Head: Normocephalic.  Right Ear: Tympanic membrane and external ear normal.  Left Ear: Tympanic membrane and external ear normal.  Eyes: EOM and lids are normal. Pupils are equal, round, and reactive to light.  Neck: Normal range of motion. Neck supple. Carotid bruit is not present.  Cardiovascular: Normal rate, regular rhythm, normal heart sounds, intact distal pulses and normal pulses.   Pulmonary/Chest: Breath sounds normal. No respiratory distress.  Abdominal: Soft. Bowel sounds are normal. There is no tenderness. There is no guarding.  Genitourinary:       No scrotal involvement of the left inguinal abscess. There is increased redness around the left inguinal abscess area. A few palpable nodes in  the inguinal area on the left.  Musculoskeletal: Normal range of motion.  Lymphadenopathy:       Head (right side): No submandibular adenopathy present.       Head (left side): No submandibular adenopathy present.    He has no cervical adenopathy.  Neurological: He is alert and oriented to person, place, and time. He has normal strength. No cranial nerve deficit or sensory deficit.  Skin: Skin is warm and  dry.       Abscess with mild drainage of the left inguinal area. Red and painful to touch.  Psychiatric: He has a normal mood and affect. His speech is normal.    ED Course  Procedures (including critical care time)INCISION AND DRAINAGE Performed by: Lenox Ahr Consent: Verbal consent obtained. Risks and benefits: risks, benefits and alternatives were discussed Type: abscess  Body area:Left groin  Anesthesia: local infiltration  Local anesthetic: lidocaine 2% Without* epinephrine  Anesthetic total:5* ml  Complexity: complex Blunt dissection to break up loculations  Drainage: purulent  Drainage amount: Moderate  Packing material: 1/4 in iodoform gauze  Patient tolerance: Patient tolerated the procedure well with no immediate complications.   Pulse oximetry 95% on room air. Within normal limits by my interpretation.  Labs Reviewed - No data to display No results found.   Dx: Abscess/cellulitis left inguinal area.   MDM  I have reviewed nursing notes, vital signs, and all appropriate lab and imaging results for this patient. Patient has had 3 days of pain in the left groin area. He's had episodes of vomiting. And on last evening he had fever of 101. He started taking some doxycycline that he had from a previous illness but the area continues to be painful red and now beginning to have mild drainage. He denies any testicular involvement. Incision and drainage was carried out. IV vancomycin was given. The patient is afebrile at discharge. It is safe for the patient be discharged home. The patient is to have the packing removed in 3 days. Prescription for Augmentin 875, doxycycline 100 mg, and Percocet 5 mg given to the patient. The patient is to return if any changes problems or concerns.  Patient seen with me by Dr. Lacinda Axon.       Lenox Ahr, PA 10/25/11 [place], [place] 11/30/11 1257

## 2011-10-25 NOTE — ED Notes (Signed)
Fever, abscess to groin area, vomited x3 today.  Started taking doxycyline from previous illness.

## 2011-10-25 NOTE — ED Notes (Signed)
PA at bedside for I & D.

## 2011-10-25 NOTE — ED Notes (Signed)
Pt alert & oriented x4, stable gait. Pt given discharge instructions, paperwork & prescription(s), pt verbalized understanding. Pt left department w/ no further questions.  

## 2011-10-25 NOTE — ED Provider Notes (Signed)
Medical screening examination/treatment/procedure(s) were conducted as a shared visit with non-physician practitioner(s) and myself.  I personally evaluated the patient during the encounter  Nat Christen, MD 10/25/11 2232

## 2011-10-28 LAB — CULTURE, ROUTINE-ABSCESS

## 2011-10-29 NOTE — ED Notes (Signed)
Chart sent to [place] office for review

## 2011-10-30 NOTE — ED Notes (Signed)
No change in meds per protocol MD Ivonne Andrew)

## 2011-12-04 NOTE — ED Provider Notes (Signed)
Medical screening examination/treatment/procedure(s) were performed by non-physician practitioner and as supervising physician I was immediately available for consultation/collaboration.  Nat Christen, MD 12/04/11 [phone]

## 2011-12-17 ENCOUNTER — Encounter (HOSPITAL_COMMUNITY): Payer: Self-pay | Admitting: *Deleted

## 2011-12-17 ENCOUNTER — Emergency Department (HOSPITAL_COMMUNITY)
Admission: EM | Admit: 2011-12-17 | Discharge: 2011-12-17 | Disposition: A | Discharge status: Home / Self Care | Payer: BC Managed Care – PPO | Attending: Emergency Medicine | Admitting: Emergency Medicine

## 2011-12-17 DIAGNOSIS — J45909 Unspecified asthma, uncomplicated: Secondary | ICD-10-CM | POA: Insufficient documentation

## 2011-12-17 DIAGNOSIS — Y998 Other external cause status: Secondary | ICD-10-CM | POA: Insufficient documentation

## 2011-12-17 DIAGNOSIS — S161XXA Strain of muscle, fascia and tendon at neck level, initial encounter: Secondary | ICD-10-CM

## 2011-12-17 DIAGNOSIS — M5431 Sciatica, right side: Secondary | ICD-10-CM

## 2011-12-17 DIAGNOSIS — S335XXA Sprain of ligaments of lumbar spine, initial encounter: Secondary | ICD-10-CM | POA: Insufficient documentation

## 2011-12-17 DIAGNOSIS — S39012A Strain of muscle, fascia and tendon of lower back, initial encounter: Secondary | ICD-10-CM

## 2011-12-17 DIAGNOSIS — I10 Essential (primary) hypertension: Secondary | ICD-10-CM | POA: Insufficient documentation

## 2011-12-17 DIAGNOSIS — M5430 Sciatica, unspecified side: Secondary | ICD-10-CM | POA: Insufficient documentation

## 2011-12-17 DIAGNOSIS — Y93I9 Activity, other involving external motion: Secondary | ICD-10-CM | POA: Insufficient documentation

## 2011-12-17 DIAGNOSIS — Y92410 Unspecified street and highway as the place of occurrence of the external cause: Secondary | ICD-10-CM | POA: Insufficient documentation

## 2011-12-17 DIAGNOSIS — S139XXA Sprain of joints and ligaments of unspecified parts of neck, initial encounter: Secondary | ICD-10-CM | POA: Insufficient documentation

## 2011-12-17 MED ORDER — CYCLOBENZAPRINE HCL 10 MG PO TABS: ORAL_TABLET | ORAL | Status: DC

## 2011-12-17 MED ORDER — CYCLOBENZAPRINE HCL 10 MG PO TABS
10.0000 mg | ORAL_TABLET | Freq: Once | ORAL | Status: AC
  Administered 2011-12-17: 10 mg via ORAL
  Filled 2011-12-17: qty 1

## 2011-12-17 MED ORDER — PREDNISONE 20 MG PO TABS
60.0000 mg | ORAL_TABLET | Freq: Once | ORAL | Status: AC
  Administered 2011-12-17: 60 mg via ORAL
  Filled 2011-12-17: qty 3

## 2011-12-17 MED ORDER — PREDNISONE 50 MG PO TABS: ORAL_TABLET | ORAL | Status: AC
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
## 2011-12-17 NOTE — Discharge Instructions (Signed)
Cryotherapy Cryotherapy means treatment with cold. Ice or gel packs can be used to reduce both pain and swelling. Ice is the most helpful within the first 24 to 48 hours after an injury or flareup from overusing a muscle or joint. Sprains, strains, spasms, burning pain, shooting pain, and aches can all be eased with ice. Ice can also be used when recovering from surgery. Ice is effective, has very few side effects, and is safe for most people to use. PRECAUTIONS  Ice is not a safe treatment option for people with:  Raynaud's phenomenon. This is a condition affecting small blood vessels in the extremities. Exposure to cold may cause your problems to return.   Cold hypersensitivity. There are many forms of cold hypersensitivity, including:   Cold urticaria. Red, itchy hives appear on the skin when the tissues begin to warm after being iced.   Cold erythema. This is a red, itchy rash caused by exposure to cold.   Cold hemoglobinuria. Red blood cells break down when the tissues begin to warm after being iced. The hemoglobin that carry oxygen are passed into the urine because they cannot combine with blood proteins fast enough.   Numbness or altered sensitivity in the area being iced.  If you have any of the following conditions, do not use ice until you have discussed cryotherapy with your caregiver:  Heart conditions, such as arrhythmia, angina, or chronic heart disease.   High blood pressure.   Healing wounds or open skin in the area being iced.   Current infections.   Rheumatoid arthritis.   Poor circulation.   Diabetes.  Ice slows the blood flow in the region it is applied. This is beneficial when trying to stop inflamed tissues from spreading irritating chemicals to surrounding tissues. However, if you expose your skin to cold temperatures for too long or without the proper protection, you can damage your skin or nerves. Watch for signs of skin damage due to cold. HOME CARE  INSTRUCTIONS Follow these tips to use ice and cold packs safely.  Place a dry or damp towel between the ice and skin. A damp towel will cool the skin more quickly, so you may need to shorten the time that the ice is used.   For a more rapid response, add gentle compression to the ice.   Ice for no more than 10 to 20 minutes at a time. The bonier the area you are icing, the less time it will take to get the benefits of ice.   Check your skin after 5 minutes to make sure there are no signs of a poor response to cold or skin damage.   Rest 20 minutes or more in between uses.   Once your skin is numb, you can end your treatment. You can test numbness by very lightly touching your skin. The touch should be so light that you do not see the skin dimple from the pressure of your fingertip. When using ice, most people will feel these normal sensations in this order: cold, burning, aching, and numbness.   Do not use ice on someone who cannot communicate their responses to pain, such as small children or people with dementia.  HOW TO MAKE AN ICE PACK Ice packs are the most common way to use ice therapy. Other methods include ice massage, ice baths, and cryo-sprays. Muscle creams that cause a cold, tingly feeling do not offer the same benefits that ice offers and should not be used as a substitute  unless recommended by your caregiver. To make an ice pack, do one of the following:  Place crushed ice or a bag of frozen vegetables in a sealable plastic bag. Squeeze out the excess air. Place this bag inside another plastic bag. Slide the bag into a pillowcase or place a damp towel between your skin and the bag.   Mix 3 parts water with 1 part rubbing alcohol. Freeze the mixture in a sealable plastic bag. When you remove the mixture from the freezer, it will be slushy. Squeeze out the excess air. Place this bag inside another plastic bag. Slide the bag into a pillowcase or place a damp towel between your skin  and the bag.  SEEK MEDICAL CARE IF:  You develop white spots on your skin. This may give the skin a blotchy (mottled) appearance.   Your skin turns blue or pale.   Your skin becomes waxy or hard.   Your swelling gets worse.  MAKE SURE YOU:   Understand these instructions.   Will watch your condition.   Will get help right away if you are not doing well or get worse.  Document Released: 05/08/2011 Document Revised: 08/31/2011 Document Reviewed: 05/08/2011 [facility] Patient Information 2012 [place].Sciatica Sciatica is a weakness and/or changes in sensation (tingling, jolts, hot and cold, numbness) along the path the sciatic nerve travels. Irritation or damage to lumbar nerve roots is often also referred to as lumbar radiculopathy.  Lumbar radiculopathy (Sciatica) is the most common form of this problem. Radiculopathy can occur in any of the nerves coming out of the spinal cord. The problems caused depend on which nerves are involved. The sciatic nerve is the large nerve supplying the branches of nerves going from the hip to the toes. It often causes a numbness or weakness in the skin and/or muscles that the sciatic nerve serves. It also may cause symptoms (problems) of pain, burning, tingling, or electric shock-like feelings in the path of this nerve. This usually comes from injury to the fibers that make up the sciatic nerve. Some of these symptoms are low back pain and/or unpleasant feelings in the following areas:  From the mid-buttock down the back of the leg to the back of the knee.   And/or the outside of the calf and top of the foot.   And/or behind the inner ankle to the sole of the foot.  CAUSES   Herniated or slipped disc. Discs are the little cushions between the bones in the back.   Pressure by the piriformis muscle in the buttock on the sciatic nerve (Piriformis Syndrome).   Misalignment of the bones in the lower back and buttocks (Sacroiliac Joint Derangement).     Narrowing of the spinal canal that puts pressure on or pinches the fibers that make up the sciatic nerve.   A slipped vertebra that is out of line with those above or beneath it.   Abnormality of the nervous system itself so that nerve fibers do not transmit signals properly, especially to feet and calves (neuropathy).   Tumor (this is rare).  Your caregiver can usually determine the cause of your sciatica and begin the treatment most likely to help you. TREATMENT  Taking over-the-counter painkillers, physical therapy, rest, exercise, spinal manipulation, and injections of anesthetics and/or steroids may be used. Surgery, acupuncture, and Yoga can also be effective. Mind over matter techniques, mental imagery, and changing factors such as your bed, chair, desk height, posture, and activities are other treatments that may be  helpful. You and your caregiver can help determine what is best for you. With proper diagnosis, the cause of most sciatica can be identified and removed. Communication and cooperation between your caregiver and you is essential. If you are not successful immediately, do not be discouraged. With time, a proper treatment can be found that will make you comfortable. HOME CARE INSTRUCTIONS   If the pain is coming from a problem in the back, applying ice to that area for 15 to 20 minutes, 3 to 4 times per day while awake, may be helpful. Put the ice in a plastic bag. Place a towel between the bag of ice and your skin.   You may exercise or perform your usual activities if these do not aggravate your pain, or as suggested by your caregiver.   Only take over-the-counter or prescription medicines for pain, discomfort, or fever as directed by your caregiver.   If your caregiver has given you a follow-up appointment, it is very important to keep that appointment. Not keeping the appointment could result in a chronic or permanent injury, pain, and disability. If there is any problem  keeping the appointment, you must call back to this facility for assistance.  SEEK IMMEDIATE MEDICAL CARE IF:   You experience loss of control of bowel or bladder.   You have increasing weakness in the trunk, buttocks, or legs.   There is numbness in any areas from the hip down to the toes.   You have difficulty walking or keeping your balance.   You have any of the above, with fever or forceful vomiting.  Document Released: 09/05/2001 Document Revised: 08/31/2011 Document Reviewed: 04/24/2008 [facility] Patient Information 2012 [place].Cervical Strain Care After A cervical strain is when the muscles and ligaments in your neck have been stretched. The bones are not broken. If you had any problems moving your arms or legs immediately after the injury, even if the problem has gone away, make sure to tell this to your caregiver.  HOME CARE INSTRUCTIONS   While awake, apply ice packs to the neck or areas of pain about every 1 to 2 hours, for 15 to 20 minutes at a time. Do this for 2 days. If you were given a cervical collar for support, ask your caregiver if you may remove it for bathing or applying ice.   If given a cervical collar, wear as instructed. Do not remove any collar unless instructed by a caregiver.   Only take over-the-counter or prescription medicines for pain, discomfort, or fever as directed by your caregiver.  Recheck with the hospital or clinic after a radiologist has read your X-rays. Recheck with the hospital or clinic to make sure the initial readings are correct. Do this also to determine if you need further studies. It is your responsibility to find out your X-ray results. X-rays are sometimes repeated in one week to ten days. These are often repeated to make sure that a hairline fracture was not overlooked. Ask your caregiver how you are to find out about your radiology (X-ray) results. SEEK IMMEDIATE MEDICAL CARE IF:   You have increasing pain in your neck.    You develop difficulties swallowing or breathing.   You have numbness, weakness, or movement problems in the arms or legs.   You have difficulty walking.   You develop bowel or bladder retention or incontinence.   You have problems with walking.  MAKE SURE YOU:   Understand these instructions.   Will watch your condition.  Will get help right away if you are not doing well or get worse.  Document Released: 09/11/2005 Document Revised: 05/24/2011 Document Reviewed: 04/24/2008 [facility] Patient Information 2012 [place].    Take the meds as directed.  Do NOT take any NSAID's while taking the prednisone.  Apply ice 20-30 min several times daily to neck and lower back.  Follow up with dr. Luna Glasgow as needed.
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
## 2011-12-17 NOTE — ED Notes (Signed)
Pt a/ox4. Resp even and unlabored. NAD at this time. D/ C instructions reviewed with pt. Pt verbalized understanding. Pt ambulated to d/c desk with steady gate.  

## 2011-12-17 NOTE — ED Provider Notes (Signed)
Medical screening examination/treatment/procedure(s) were performed by non-physician practitioner and as supervising physician I was immediately available for consultation/collaboration.  Nat Christen, MD 12/17/11 2118

## 2012-02-10 ENCOUNTER — Emergency Department (HOSPITAL_COMMUNITY): Payer: BC Managed Care – PPO

## 2012-02-10 ENCOUNTER — Emergency Department (HOSPITAL_COMMUNITY)
Admission: EM | Admit: 2012-02-10 | Discharge: 2012-02-10 | Disposition: A | Discharge status: Home / Self Care | Payer: BC Managed Care – PPO | Attending: Emergency Medicine | Admitting: Emergency Medicine

## 2012-02-10 ENCOUNTER — Encounter (HOSPITAL_COMMUNITY): Payer: Self-pay | Admitting: *Deleted

## 2012-02-10 DIAGNOSIS — Z79899 Other long term (current) drug therapy: Secondary | ICD-10-CM | POA: Insufficient documentation

## 2012-02-10 DIAGNOSIS — M791 Myalgia, unspecified site: Secondary | ICD-10-CM

## 2012-02-10 DIAGNOSIS — J302 Other seasonal allergic rhinitis: Secondary | ICD-10-CM

## 2012-02-10 DIAGNOSIS — IMO0001 Reserved for inherently not codable concepts without codable children: Secondary | ICD-10-CM | POA: Insufficient documentation

## 2012-02-10 DIAGNOSIS — J309 Allergic rhinitis, unspecified: Secondary | ICD-10-CM | POA: Insufficient documentation

## 2012-02-10 DIAGNOSIS — J45909 Unspecified asthma, uncomplicated: Secondary | ICD-10-CM | POA: Insufficient documentation

## 2012-02-10 DIAGNOSIS — I10 Essential (primary) hypertension: Secondary | ICD-10-CM | POA: Insufficient documentation

## 2012-02-10 LAB — RAPID STREP SCREEN (MED CTR MEBANE ONLY): Streptococcus, Group A Screen (Direct): NEGATIVE

## 2012-02-10 MED ORDER — CETIRIZINE HCL 10 MG PO TABS: 10.0000 mg | ORAL_TABLET | Freq: Every day | ORAL | Status: DC

## 2012-02-10 MED ORDER — NAPROXEN 500 MG PO TABS: 500.0000 mg | ORAL_TABLET | Freq: Two times a day (BID) | ORAL | Status: DC

## 2012-02-10 MED ORDER — KETOROLAC TROMETHAMINE 60 MG/2ML IM SOLN
60.0000 mg | Freq: Once | INTRAMUSCULAR | Status: AC
  Administered 2012-02-10: 60 mg via INTRAMUSCULAR
  Filled 2012-02-10 (×2): qty 2

## 2012-02-10 NOTE — ED Notes (Signed)
MD at bedside. Pt c/o increased sneezing, watery eyes and sore throat over the past week

## 2012-02-10 NOTE — ED Notes (Signed)
Pt alert & oriented x4, stable gait. Pt given discharge instructions, paperwork & prescription(s). Patient instructed to stop at the registration desk to finish any additional paperwork. pt verbalized understanding. Pt left department w/ no further questions.  

## 2012-02-10 NOTE — ED Notes (Signed)
MD at bedside. 

## 2012-02-10 NOTE — Discharge Instructions (Signed)
Take the inflammatory call Naprosyn twice a day, take Zyrtec before going to bed each night. Followup with your doctor on Monday, if you do not have a doctor see the followup list below.  RESOURCE GUIDE  Dental Problems  Patients with Medicaid: [place] [facility] [employer] Phone:  [phone]                                                  Phone:  [phone]  If unable to pay or uninsured, contact:  [facility] or [facility]. to become qualified for the adult dental clinic.  Chronic Pain Problems Contact [facility]  [phone] Patients need to be referred by their primary care doctor.  Insufficient Money for Medicine Contact [employer]:  call "211" or [place] [phone].  No Primary Care Doctor Call [facility]  [phone] Other agencies that provide inexpensive medical care    [place]  [phone]    [facility]  [place]  [phone]    [facility]  [phone]    [facility]  [place]  [place]  [phone] [place]   [phone] (emergency services [phone])  Substance Abuse [facility]  [phone] [facility] [phone] [facility] [place] [phone] [place] Pester [phone] Residential & Outpatient Substance Abuse Program  [phone]  Abuse/Neglect [place] [phone] [place] [phone] (After Hours)  Emergency [place] [phone]  [place] at the [place] [phone] [place] [phone]  MRSA Hotline #:   347-069-8166    [facility] [place] Dept. [address]      [place] Phone:  [phone]  Phone:  [phone]                 Phone:  [phone]  [place] [facility] Phone:  [phone]  [place] Child Abuse Hotline [phone] (After Hours)   

## 2012-02-10 NOTE — ED Notes (Signed)
Pt states aspirin make him faint. & denies taking BC powders. EDP notified of allergy again.

## 2012-02-10 NOTE — ED Provider Notes (Signed)
History     CSN: [id number]  Arrival date & time 02/10/12  Benancio Deeds   First MD Initiated Contact with Patient 02/10/12 0036      Chief Complaint  Patient presents with  . Generalized Body Aches    (Consider location/radiation/quality/duration/timing/severity/associated sxs/prior treatment) HPI Comments: 41 year old male with a history of hypertension and asthma who presents with approximately 4 days of gradual onset of cough productive of yellow phlegm, sore throat and headache. This has not been associated with fever but it has been associated with myalgias, ear pain bilaterally, itchy eyes and frequent sneezing. The symptoms are persistent, nothing makes it better or worse.    The history is provided by the patient and medical records.    Past Medical History  Diagnosis Date  . Hypertension   . Asthma     History reviewed. No pertinent past surgical history.  Family History  Problem Relation Age of Onset  . Hypertension Father     History  Substance Use Topics  . Smoking status: Never Smoker   . Smokeless tobacco: Not on file  . Alcohol Use: No      Review of Systems  All other systems reviewed and are negative.    Allergies  Aspirin  Home Medications   Current Outpatient Rx  Name Route Sig Dispense Refill  . ALBUTEROL SULFATE HFA 108 (90 BASE) MCG/ACT IN AERS Inhalation Inhale 2 puffs into the lungs every 4 (four) hours as needed for wheezing. 1 Inhaler 0  . BISOPROLOL-HYDROCHLOROTHIAZIDE 2.5-6.25 MG PO TABS Oral Take 1 tablet by mouth daily.     Marland Kitchen CETIRIZINE HCL 10 MG PO TABS Oral Take 1 tablet (10 mg total) by mouth daily. 30 tablet 1  . CYCLOBENZAPRINE HCL 10 MG PO TABS  1/2 to one po TID 20 tablet 0  . DOXYCYCLINE HYCLATE 100 MG PO TABS Oral Take 100 mg by mouth 2 (two) times daily.     Marland Kitchen NAPROXEN 500 MG PO TABS Oral Take 1 tablet (500 mg total) by mouth 2 (two) times daily with a meal. 30 tablet 0    BP 143/84  Pulse 94  Temp(Src) 98.6 F (37 C)  (Oral)  Resp 20  Ht 5\' 7"  (1.702 m)  Wt 228 lb (103.42 kg)  BMI 35.71 kg/m2  SpO2 98%  Physical Exam  Nursing note and vitals reviewed. Constitutional: He appears well-developed and well-nourished. No distress.  HENT:  Head: Normocephalic and atraumatic.  Mouth/Throat: Oropharynx is clear and moist. No oropharyngeal exudate.       Tympanic membranes normal bilaterally, oropharynx is clear without erythema asymmetry exudate or hypertrophy, mucous membranes are moist.  Eyes: Conjunctivae and EOM are normal. Pupils are equal, round, and reactive to light. Right eye exhibits no discharge. Left eye exhibits no discharge. No scleral icterus.  Neck: Normal range of motion. Neck supple. No JVD present. No thyromegaly present.       No cervical lymphadenopathy  Cardiovascular: Normal rate, regular rhythm, normal heart sounds and intact distal pulses.  Exam reveals no gallop and no friction rub.   No murmur heard. Pulmonary/Chest: Effort normal and breath sounds normal. No respiratory distress. He has no wheezes. He has no rales.  Abdominal: Soft. Bowel sounds are normal. He exhibits no distension and no mass. There is no tenderness.  Musculoskeletal: Normal range of motion. He exhibits no edema and no tenderness.  Lymphadenopathy:    He has no cervical adenopathy.  Neurological: He is alert. Coordination normal.  Skin: Skin  is warm and dry. No rash noted. No erythema.  Psychiatric: He has a normal mood and affect. His behavior is normal.    ED Course  Procedures (including critical care time)   Labs Reviewed  RAPID STREP SCREEN   No results found.   1. Seasonal allergies   2. Myalgia       MDM  Overall the patient appears well, has vital signs which are normal however he does have a constellation of symptoms which could be consistent with a pneumonia versus allergies. Will obtain a chest x-ray, strep rapid, initiate therapy for allergies and possible infection pending  results.  Lab is negative for strep, chest x-ray negative for infiltrate and vital signs are normal. Patient has been given intramuscular Toradol to treat myalgias and has been given a prescription for Zyrtec and Naprosyn to treat allergies anti-inflammatory pain medication as an outpatient. Patient has been explained his results and has had verbal understanding of the plan going forward. Followup list given.      Johnna Acosta, MD 02/10/12 [phone]

## 2012-02-10 NOTE — ED Notes (Signed)
Pt reports generalized body aches, headache and sore throat starting approx 4 days ago

## 2012-03-29 ENCOUNTER — Encounter (HOSPITAL_COMMUNITY): Payer: Self-pay | Admitting: Emergency Medicine

## 2012-03-29 ENCOUNTER — Emergency Department (HOSPITAL_COMMUNITY)
Admission: EM | Admit: 2012-03-29 | Discharge: 2012-03-29 | Disposition: A | Discharge status: Home / Self Care | Payer: BC Managed Care – PPO | Attending: Emergency Medicine | Admitting: Emergency Medicine

## 2012-03-29 DIAGNOSIS — R252 Cramp and spasm: Secondary | ICD-10-CM | POA: Insufficient documentation

## 2012-03-29 DIAGNOSIS — J45909 Unspecified asthma, uncomplicated: Secondary | ICD-10-CM | POA: Insufficient documentation

## 2012-03-29 DIAGNOSIS — I10 Essential (primary) hypertension: Secondary | ICD-10-CM | POA: Insufficient documentation

## 2012-03-29 LAB — POCT I-STAT, CHEM 8
BUN: 8 mg/dL (ref 6–23)
Calcium, Ion: 1.24 mmol/L — ABNORMAL HIGH (ref 1.12–1.23)
HCT: 41 % (ref 39.0–52.0)
TCO2: 26 mmol/L (ref 0–100)

## 2012-03-29 NOTE — ED Provider Notes (Signed)
I saw and evaluated the patient, reviewed the resident's note and I agree with the findings and plan.  Several fleeting spells several seconds each painful jaw/hand/leg, no weak/numb/incoordination/paresthesias, no Sxs in ED doubt TIA.  Babette Relic, MD 03/30/12 1346

## 2012-03-29 NOTE — ED Provider Notes (Signed)
History     CSN: [id number]  Arrival date & time 03/29/12  1548   First MD Initiated Contact with Patient 03/29/12 1955      Chief Complaint  Patient presents with  . Extremity Weakness    (Consider location/radiation/quality/duration/timing/severity/associated sxs/prior treatment) Patient is a 41 y.o. male presenting with extremity weakness. The history is provided by the patient.  Extremity Weakness This is a new problem. The current episode started yesterday. The problem occurs intermittently. The problem has been resolved. Pertinent negatives include no abdominal pain, chest pain, congestion, coughing, fatigue, fever, headaches, nausea, neck pain, numbness, rash, sore throat, vomiting or weakness. Nothing aggravates the symptoms. He has tried nothing for the symptoms. The treatment provided no relief.    Past Medical History  Diagnosis Date  . Hypertension   . Asthma     History reviewed. No pertinent past surgical history.  Family History  Problem Relation Age of Onset  . Hypertension Father     History  Substance Use Topics  . Smoking status: Never Smoker   . Smokeless tobacco: Not on file  . Alcohol Use: No      Review of Systems  Constitutional: Negative for fever, activity change, appetite change and fatigue.  HENT: Negative for congestion, sore throat, facial swelling, rhinorrhea, trouble swallowing, neck pain, neck stiffness, voice change and sinus pressure.   Eyes: Negative.   Respiratory: Negative for cough, choking, chest tightness, shortness of breath and wheezing.   Cardiovascular: Negative for chest pain.  Gastrointestinal: Negative for nausea, vomiting and abdominal pain.  Genitourinary: Negative for dysuria, urgency, frequency, hematuria, flank pain and difficulty urinating.  Musculoskeletal: Positive for extremity weakness. Negative for back pain and gait problem.  Skin: Negative for rash and wound.  Neurological: Negative for facial asymmetry,  weakness, numbness and headaches.  Psychiatric/Behavioral: Negative for behavioral problems, confusion and agitation. The patient is not nervous/anxious and is not hyperactive.   All other systems reviewed and are negative.    Allergies  Aspirin  Home Medications   Current Outpatient Rx  Name Route Sig Dispense Refill  . ALBUTEROL SULFATE HFA 108 (90 BASE) MCG/ACT IN AERS Inhalation Inhale 2 puffs into the lungs every 4 (four) hours as needed. For wheezing.      BP 153/96  Pulse 88  Temp 97.6 F (36.4 C) (Oral)  Resp 24  SpO2 99%  Physical Exam  Nursing note and vitals reviewed. Constitutional: He is oriented to person, place, and time. He appears well-developed and well-nourished. No distress.  HENT:  Head: Normocephalic and atraumatic.  Right Ear: External ear normal.  Left Ear: External ear normal.  Mouth/Throat: No oropharyngeal exudate.  Eyes: Conjunctivae and EOM are normal. Pupils are equal, round, and reactive to light. Right eye exhibits no discharge. Left eye exhibits no discharge.  Neck: Normal range of motion. Neck supple. No JVD present. No tracheal deviation present. No thyromegaly present.  Cardiovascular: Normal rate, regular rhythm, normal heart sounds and intact distal pulses.  Exam reveals no gallop and no friction rub.   No murmur heard. Pulmonary/Chest: Effort normal and breath sounds normal. No respiratory distress. He has no wheezes. He exhibits no tenderness.  Abdominal: Soft. Bowel sounds are normal. He exhibits no distension. There is no tenderness. There is no rebound and no guarding.  Musculoskeletal: Normal range of motion. He exhibits no edema and no tenderness.  Lymphadenopathy:    He has no cervical adenopathy.  Neurological: He is alert and oriented to person, place, and  time. He has normal strength and normal reflexes. No cranial nerve deficit or sensory deficit. He displays a negative Romberg sign. GCS eye subscore is 4. GCS verbal subscore  is 5. GCS motor subscore is 6.  Skin: Skin is warm and dry. No rash noted. He is not diaphoretic. No pallor.  Psychiatric: He has a normal mood and affect. His behavior is normal.    ED Course  Procedures (including critical care time)  Labs Reviewed  POCT I-STAT, CHEM 8 - Abnormal; Notable for the following:    Glucose, Bld 213 (*)     Calcium, Ion 1.24 (*)     All other components within normal limits   No results found.   1. Muscle cramps       MDM  41 year old male patient with noncontributory past medical history presents with complaints of cramping and right hand and right foot. Patient initially described as weakness. Patient said yesterday he noticed some muscles in his forearm and his hands started cramping and flexing and he was unable to use his hand. He said the episode lasted for approximately 10 seconds. He said it happened 3 or 4 times. The patient said the same thing happened to his right foot. Patient also said he felt a cramping sensation in his jaw was not able to open his mouth and therefore had trouble talking. Again all these episodes lasted 10 seconds. Patient says these episodes are painful. And the fact that it lasted less than 10 seconds they were associated with pain and cramping sensations mixed together this makes me think that these are not strokelike symptoms. Could be from dehydration or lateral abnormality. Patient says the symptoms have resolved. He was worried and wanted it evaluated. My exam is normal as above.  Results for orders placed during the hospital encounter of 03/29/12  POCT I-STAT, CHEM 8      Component Value Range   Sodium 140  135 - 145 mEq/L   Potassium 3.7  3.5 - 5.1 mEq/L   Chloride 102  96 - 112 mEq/L   BUN 8  6 - 23 mg/dL   Creatinine, Ser 0.90  0.50 - 1.35 mg/dL   Glucose, Bld 213 (*) 70 - 99 mg/dL   Calcium, Ion 1.24 (*) 1.12 - 1.23 mmol/L   TCO2 26  0 - 100 mmol/L   Hemoglobin 13.9  13.0 - 17.0 g/dL   HCT 41.0  39.0 - 52.0  %     Labs are normal patient's been asymptomatic for the duration of his stay in the emergency department. Patient was discharged with followup instructions with PCP is otherwise well.  Case discussed with Dr. Normajean Glasgow, MD 03/29/12 [phone]

## 2012-03-29 NOTE — ED Notes (Signed)
States while playing drums yesterday he lost control of R hand 4 times and then R hand started contracting for approx 10 sec.  States jaw was also locked and he was drooling.  Those symptoms resolved and he went shopping.  While shopping he lost feeling in his R foot for approx 5 seconds.  No other symptoms last night. On the way to the hospital today he had episode of L hand locking onto steering wheel and had to use other hand to remove L hand from steering wheel. Reports intermittent pain to L back for approx 1 month. No neuro deficits at present.

## 2012-04-02 ENCOUNTER — Emergency Department (HOSPITAL_COMMUNITY)
Admission: EM | Admit: 2012-04-02 | Discharge: 2012-04-02 | Disposition: A | Discharge status: Home / Self Care | Payer: BC Managed Care – PPO | Attending: Emergency Medicine | Admitting: Emergency Medicine

## 2012-04-02 ENCOUNTER — Encounter (HOSPITAL_COMMUNITY): Payer: Self-pay | Admitting: *Deleted

## 2012-04-02 DIAGNOSIS — S335XXA Sprain of ligaments of lumbar spine, initial encounter: Secondary | ICD-10-CM | POA: Insufficient documentation

## 2012-04-02 DIAGNOSIS — I10 Essential (primary) hypertension: Secondary | ICD-10-CM | POA: Insufficient documentation

## 2012-04-02 DIAGNOSIS — J45909 Unspecified asthma, uncomplicated: Secondary | ICD-10-CM | POA: Insufficient documentation

## 2012-04-02 DIAGNOSIS — Z8249 Family history of ischemic heart disease and other diseases of the circulatory system: Secondary | ICD-10-CM | POA: Insufficient documentation

## 2012-04-02 DIAGNOSIS — S39012A Strain of muscle, fascia and tendon of lower back, initial encounter: Secondary | ICD-10-CM

## 2012-04-02 DIAGNOSIS — X500XXA Overexertion from strenuous movement or load, initial encounter: Secondary | ICD-10-CM | POA: Insufficient documentation

## 2012-04-02 DIAGNOSIS — Z888 Allergy status to other drugs, medicaments and biological substances status: Secondary | ICD-10-CM | POA: Insufficient documentation

## 2012-04-02 MED ORDER — IBUPROFEN 800 MG PO TABS
800.0000 mg | ORAL_TABLET | Freq: Once | ORAL | Status: AC
  Administered 2012-04-02: 800 mg via ORAL
  Filled 2012-04-02: qty 1

## 2012-04-02 MED ORDER — HYDROCODONE-ACETAMINOPHEN 5-325 MG PO TABS
1.0000 | ORAL_TABLET | Freq: Once | ORAL | Status: AC
  Administered 2012-04-02: 1 via ORAL
  Filled 2012-04-02: qty 1

## 2012-04-02 MED ORDER — HYDROCODONE-ACETAMINOPHEN 5-325 MG PO TABS: 1.0000 | ORAL_TABLET | Freq: Four times a day (QID) | ORAL | Status: AC | PRN

## 2012-04-02 NOTE — ED Provider Notes (Signed)
History     CSN: [id number]  Arrival date & time 04/02/12  1121   First MD Initiated Contact with Patient 04/02/12 1222      Chief Complaint  Patient presents with  . Back Pain    (Consider location/radiation/quality/duration/timing/severity/associated sxs/prior treatment) HPI Comments: Pt took his daughter to daycare this AM.  He was bent over and in a twisted position when his daughter jumped and he caught her.  Felt his back "pop" and has had pain since then with radiation into B buttocks.  Patient is a 41 y.o. male presenting with back pain. The history is provided by the patient. No language interpreter was used.  Back Pain  This is a new problem. Episode onset: this AM. The problem occurs constantly. The pain is present in the lumbar spine. Pertinent negatives include no numbness, no bowel incontinence, no perianal numbness, no bladder incontinence, no leg pain, no paresthesias, no paresis, no tingling and no weakness. He has tried nothing for the symptoms.    Past Medical History  Diagnosis Date  . Hypertension   . Asthma     History reviewed. No pertinent past surgical history.  Family History  Problem Relation Age of Onset  . Hypertension Father     History  Substance Use Topics  . Smoking status: Never Smoker   . Smokeless tobacco: Not on file  . Alcohol Use: No      Review of Systems  Gastrointestinal: Negative for bowel incontinence.  Genitourinary: Negative for bladder incontinence.  Musculoskeletal: Positive for back pain.  Neurological: Negative for tingling, weakness, numbness and paresthesias.  All other systems reviewed and are negative.    Allergies  Aspirin  Home Medications   Current Outpatient Rx  Name Route Sig Dispense Refill  . ALBUTEROL SULFATE HFA 108 (90 BASE) MCG/ACT IN AERS Inhalation Inhale 2 puffs into the lungs every 4 (four) hours as needed. For wheezing.    Marland Kitchen BISOPROLOL-HYDROCHLOROTHIAZIDE 2.5-6.25 MG PO TABS Oral Take 1  tablet by mouth daily.    Marland Kitchen HYDROCODONE-ACETAMINOPHEN 5-325 MG PO TABS Oral Take 1 tablet by mouth every 6 (six) hours as needed for pain. 20 tablet 0    BP 162/70  Pulse 91  Temp 98.6 F (37 C) (Oral)  Resp 17  Ht 5\' 7"  (1.702 m)  Wt 228 lb (103.42 kg)  BMI 35.71 kg/m2  SpO2 97%  Physical Exam  Nursing note and vitals reviewed. Constitutional: He is oriented to person, place, and time. He appears well-developed and well-nourished.  HENT:  Head: Normocephalic and atraumatic.  Eyes: EOM are normal.  Neck: Normal range of motion.  Cardiovascular: Normal rate, regular rhythm, normal heart sounds and intact distal pulses.   Pulmonary/Chest: Effort normal and breath sounds normal. No respiratory distress.  Abdominal: Soft. He exhibits no distension. There is no tenderness.  Musculoskeletal: He exhibits no tenderness.       Lumbar back: He exhibits decreased range of motion, tenderness and pain. He exhibits no bony tenderness and normal pulse.       Back:  Neurological: He is alert and oriented to person, place, and time. He displays normal reflexes. Coordination normal.  Skin: Skin is warm and dry.  Psychiatric: He has a normal mood and affect. Judgment normal.    ED Course  Procedures (including critical care time)  Labs Reviewed - No data to display No results found.   1. Lumbar strain       MDM  No neurol sxs r- ibuprofen  800 mg TID rx-hydrocodone, 20 ice        Jennye Boroughs, [place] 04/02/12 1657

## 2012-04-02 NOTE — ED Notes (Signed)
Pt  went to pick up his daughter and twisted his lower back, pt c/o pain to lower back area that radiates down bilateral legs, pt states that he has hx of lower back problems and that this feels the same,

## 2012-04-06 NOTE — ED Provider Notes (Signed)
Medical screening examination/treatment/procedure(s) were performed by non-physician practitioner and as supervising physician I was immediately available for consultation/collaboration.  Nat Christen, MD 04/06/12 [phone]

## 2012-04-21 ENCOUNTER — Encounter (HOSPITAL_COMMUNITY): Payer: Self-pay | Admitting: Emergency Medicine

## 2012-04-21 ENCOUNTER — Emergency Department (HOSPITAL_COMMUNITY)
Admission: EM | Admit: 2012-04-21 | Discharge: 2012-04-21 | Disposition: A | Discharge status: Home / Self Care | Payer: BC Managed Care – PPO | Attending: Emergency Medicine | Admitting: Emergency Medicine

## 2012-04-21 DIAGNOSIS — I10 Essential (primary) hypertension: Secondary | ICD-10-CM | POA: Insufficient documentation

## 2012-04-21 DIAGNOSIS — M7918 Myalgia, other site: Secondary | ICD-10-CM

## 2012-04-21 DIAGNOSIS — M25519 Pain in unspecified shoulder: Secondary | ICD-10-CM | POA: Insufficient documentation

## 2012-04-21 MED ORDER — CYCLOBENZAPRINE HCL 10 MG PO TABS: 10.0000 mg | ORAL_TABLET | Freq: Three times a day (TID) | ORAL | Status: AC | PRN

## 2012-04-21 MED ORDER — PREDNISONE 10 MG PO TABS: ORAL_TABLET | ORAL | Status: DC

## 2012-04-21 MED ORDER — PREDNISONE 20 MG PO TABS
60.0000 mg | ORAL_TABLET | Freq: Once | ORAL | Status: AC
  Administered 2012-04-21: 60 mg via ORAL
  Filled 2012-04-21: qty 3

## 2012-04-21 MED ORDER — CYCLOBENZAPRINE HCL 10 MG PO TABS
10.0000 mg | ORAL_TABLET | Freq: Three times a day (TID) | ORAL | Status: DC | PRN
  Administered 2012-04-21: 10 mg via ORAL
  Filled 2012-04-21: qty 1

## 2012-04-21 NOTE — ED Notes (Signed)
Patient reports being seen recently for sciatic nerve pain. Reports that approximately 5 days ago he started having the same sharp, stabbing pain in left shoulder and left upper back when he moves a certain way or attempts to pick something up.

## 2012-04-21 NOTE — ED Provider Notes (Signed)
History     CSN: [id number]  Arrival date & time 04/21/12  0014   First MD Initiated Contact with Patient 04/21/12 [phone]      Chief Complaint  Patient presents with  . Shoulder Pain    (Consider location/radiation/quality/duration/timing/severity/associated sxs/prior treatment) HPI  Patient relates about 5 days ago he started getting pain in his left posterior shoulder. He denies any change in activity or any known injury. He states that the pain is "excruciating" and he has a lot of itching in the same area. He states there has been no rash there. He states that the pain is worse with movement of his arm and when he tries to lift something heavy and when he coughs. Patient also states he's had a dry cough without fever.  PCP was Dr. Shawna Orleans before he retired  Past Medical History  Diagnosis Date  . Hypertension   . Asthma     History reviewed. No pertinent past surgical history.  Family History  Problem Relation Age of Onset  . Hypertension Father     History  Substance Use Topics  . Smoking status: Never Smoker   . Smokeless tobacco: Not on file  . Alcohol Use: No   employed    Review of Systems  All other systems reviewed and are negative.    Allergies  Aspirin  Home Medications   Current Outpatient Rx  Name Route Sig Dispense Refill  . ALBUTEROL SULFATE HFA 108 (90 BASE) MCG/ACT IN AERS Inhalation Inhale 2 puffs into the lungs every 4 (four) hours as needed. For wheezing.    Marland Kitchen BISOPROLOL-HYDROCHLOROTHIAZIDE 2.5-6.25 MG PO TABS Oral Take 1 tablet by mouth daily.      BP 152/97  Pulse 99  Temp 98.5 F (36.9 C) (Oral)  Resp 14  Ht 5\' 7"  (1.702 m)  Wt 228 lb (103.42 kg)  BMI 35.71 kg/m2  SpO2 99%  Vital signs normal except hypertension   Physical Exam  Nursing note and vitals reviewed. Constitutional: He is oriented to person, place, and time. He appears well-developed and well-nourished.  Non-toxic appearance. He does not appear ill. No  distress.  HENT:  Head: Normocephalic and atraumatic.  Right Ear: External ear normal.  Left Ear: External ear normal.  Nose: Nose normal. No mucosal edema or rhinorrhea.  Mouth/Throat: Oropharynx is clear and moist and mucous membranes are normal. No dental abscesses or uvula swelling.  Eyes: Conjunctivae and EOM are normal. Pupils are equal, round, and reactive to light.  Neck: Normal range of motion and full passive range of motion without pain. Neck supple.  Cardiovascular: Normal rate, regular rhythm and normal heart sounds.  Exam reveals no gallop and no friction rub.   No murmur heard. Pulmonary/Chest: Effort normal and breath sounds normal. No respiratory distress. He has no wheezes. He has no rhonchi. He has no rales.   He exhibits no tenderness and no crepitus.       She has tenderness along the top edge of the medial left scapula that reproduces his complaints of pain to outpatient and on range of motion of his left arm.  Abdominal: Soft. Normal appearance and bowel sounds are normal. He exhibits no distension. There is no tenderness. There is no rebound and no guarding.  Musculoskeletal: Normal range of motion. He exhibits no edema and no tenderness.       Moves all extremities well.   Neurological: He is alert and oriented to person, place, and time. He has normal strength. No  cranial nerve deficit.  Skin: Skin is warm, dry and intact. No rash noted. No erythema. No pallor.  Psychiatric: He has a normal mood and affect. His speech is normal and behavior is normal. His mood appears not anxious.    ED Course  Procedures (including critical care time)   Medications  cyclobenzaprine (FLEXERIL) tablet 10 mg (10 mg Oral Given 04/21/12 0406)  predniSONE (DELTASONE) tablet 60 mg (60 mg Oral Given 04/21/12 0406)     1. Musculoskeletal pain    New Prescriptions   CYCLOBENZAPRINE (FLEXERIL) 10 MG TABLET    Take 1 tablet (10 mg total) by mouth 3 (three) times daily as needed for  muscle spasms.   PREDNISONE (DELTASONE) 10 MG TABLET    Take 3 po QD x 2d starting tomorrow, then 2 po QD x 3d then 1 po QD x 3d    Plan discharge  Rolland Porter, MD, [place], MD 04/21/12 [phone]

## 2012-07-22 ENCOUNTER — Emergency Department (HOSPITAL_COMMUNITY)
Admission: EM | Admit: 2012-07-22 | Discharge: 2012-07-22 | Disposition: A | Discharge status: Home / Self Care | Payer: BC Managed Care – PPO | Attending: Emergency Medicine | Admitting: Emergency Medicine

## 2012-07-22 ENCOUNTER — Encounter (HOSPITAL_COMMUNITY): Payer: Self-pay | Admitting: *Deleted

## 2012-07-22 DIAGNOSIS — IMO0002 Reserved for concepts with insufficient information to code with codable children: Secondary | ICD-10-CM

## 2012-07-22 DIAGNOSIS — I10 Essential (primary) hypertension: Secondary | ICD-10-CM | POA: Insufficient documentation

## 2012-07-22 DIAGNOSIS — J45909 Unspecified asthma, uncomplicated: Secondary | ICD-10-CM | POA: Insufficient documentation

## 2012-07-22 DIAGNOSIS — Z79899 Other long term (current) drug therapy: Secondary | ICD-10-CM | POA: Insufficient documentation

## 2012-07-22 NOTE — ED Notes (Signed)
Patient states that he started having problems with his thumb on his right hand about 2 weeks ago.  States that he thinks the problem started with a "hang nail" and now the area is swollen, red and painful.  States that he did squeeze out "pus" last week.

## 2012-07-22 NOTE — ED Provider Notes (Addendum)
History     CSN: [id number]  Arrival date & time 07/22/12  0610   First MD Initiated Contact with Patient 07/22/12 [phone]      Chief Complaint  Patient presents with  . Hand Pain    right thumb    (Consider location/radiation/quality/duration/timing/severity/associated sxs/prior treatment) HPI DATWAN MUSSELWHITE is a 41 y.o. male who presents to the Emergency Department complaining of right thumb pain x 2 weeks with onset from a thumbnail that he bit  off on the medial portion of the nail. Now with swelling, erythema, and pain.   Past Medical History  Diagnosis Date  . Hypertension   . Asthma     History reviewed. No pertinent past surgical history.  Family History  Problem Relation Age of Onset  . Hypertension Father     History  Substance Use Topics  . Smoking status: Never Smoker   . Smokeless tobacco: Not on file  . Alcohol Use: No      Review of Systems  Constitutional: Negative for fever.       10 Systems reviewed and are negative for acute change except as noted in the HPI.  HENT: Negative for congestion.   Eyes: Negative for discharge and redness.  Respiratory: Negative for cough and shortness of breath.   Cardiovascular: Negative for chest pain.  Gastrointestinal: Negative for vomiting and abdominal pain.  Musculoskeletal: Negative for back pain.  Skin: Negative for rash.       Thumb pain  Neurological: Negative for syncope, numbness and headaches.  Psychiatric/Behavioral:       No behavior change.    Allergies  Aspirin  Home Medications   Current Outpatient Rx  Name Route Sig Dispense Refill  . ALBUTEROL SULFATE HFA 108 (90 BASE) MCG/ACT IN AERS Inhalation Inhale 2 puffs into the lungs every 4 (four) hours as needed. For wheezing.    Marland Kitchen BISOPROLOL-HYDROCHLOROTHIAZIDE 2.5-6.25 MG PO TABS Oral Take 1 tablet by mouth daily.    Marland Kitchen PREDNISONE 10 MG PO TABS  Take 3 po QD x 2d starting tomorrow, then 2 po QD x 3d then 1 po QD x 3d 15 tablet 0    BP  164/108  Pulse 102  Temp 98.5 F (36.9 C) (Oral)  Resp 20  SpO2 98%  Physical Exam  Nursing note and vitals reviewed. Constitutional: He appears well-developed and well-nourished.       Awake, alert, nontoxic appearance.  HENT:  Head: Atraumatic.  Eyes: Right eye exhibits no discharge. Left eye exhibits no discharge.  Neck: Neck supple.  Cardiovascular: Normal rate.   Pulmonary/Chest: Effort normal and breath sounds normal. He exhibits no tenderness.  Abdominal: Soft. There is no tenderness. There is no rebound.  Musculoskeletal: He exhibits no tenderness.       Baseline ROM, no obvious new focal weakness.  Neurological:       Mental status and motor strength appears baseline for patient and situation.  Skin: No rash noted.       Paronychia to medial right thumb.   Psychiatric: He has a normal mood and affect.    ED Course  Procedures (including critical care time) INCISION AND DRAINAGE Performed by: Prentiss Bells Consent: Verbal consent obtained. Risks and benefits: risks, benefits and alternatives were discussed Type: abscess Body area: right thumb Complexity: complex Incision made with #11 scalpel blade Drainage: purulent Drainage amount: moderate Patient tolerance: Patient tolerated the procedure well with no immediate complications.      MDM  Patient with paronychia  to right thumbnail. I%D of abscess. Dressing applied. Pt stable in ED with no significant deterioration in condition.The patient appears reasonably screened and/or stabilized for discharge and I doubt any other medical condition or other [facility] requiring further screening, evaluation, or treatment in the ED at this time prior to discharge.  MDM Reviewed: nursing note and vitals           Gypsy Balsam. Olin Hauser, MD 07/22/12 [place] Olin Hauser, MD 08/20/12 1048

## 2012-07-22 NOTE — ED Notes (Signed)
Pt c/o pain and swelling to right thumb. Pt states pain started 2 weeks ago with what he believes to be a hang nail. Pt has swelling to right hand and white puss area to thumb.

## 2012-07-31 ENCOUNTER — Encounter (HOSPITAL_COMMUNITY): Payer: Self-pay | Admitting: *Deleted

## 2012-07-31 ENCOUNTER — Emergency Department (HOSPITAL_COMMUNITY)
Admission: EM | Admit: 2012-07-31 | Discharge: 2012-07-31 | Disposition: A | Discharge status: Home / Self Care | Payer: BC Managed Care – PPO | Attending: Emergency Medicine | Admitting: Emergency Medicine

## 2012-07-31 DIAGNOSIS — J45909 Unspecified asthma, uncomplicated: Secondary | ICD-10-CM | POA: Insufficient documentation

## 2012-07-31 DIAGNOSIS — R062 Wheezing: Secondary | ICD-10-CM | POA: Insufficient documentation

## 2012-07-31 DIAGNOSIS — R51 Headache: Secondary | ICD-10-CM | POA: Insufficient documentation

## 2012-07-31 DIAGNOSIS — J3489 Other specified disorders of nose and nasal sinuses: Secondary | ICD-10-CM | POA: Insufficient documentation

## 2012-07-31 DIAGNOSIS — R5381 Other malaise: Secondary | ICD-10-CM | POA: Insufficient documentation

## 2012-07-31 DIAGNOSIS — Z79899 Other long term (current) drug therapy: Secondary | ICD-10-CM | POA: Insufficient documentation

## 2012-07-31 DIAGNOSIS — I10 Essential (primary) hypertension: Secondary | ICD-10-CM | POA: Insufficient documentation

## 2012-07-31 MED ORDER — PREDNISONE 10 MG PO TABS: ORAL_TABLET | ORAL | Status: DC

## 2012-07-31 MED ORDER — PSEUDOEPHEDRINE HCL 60 MG PO TABS: ORAL_TABLET | ORAL | Status: DC

## 2012-07-31 MED ORDER — HYDROCODONE-ACETAMINOPHEN 5-325 MG PO TABS: 1.0000 | ORAL_TABLET | ORAL | Status: DC | PRN

## 2012-07-31 NOTE — ED Notes (Signed)
Headache, nasal congestion, eye d/c.

## 2012-07-31 NOTE — ED Provider Notes (Signed)
History     CSN: [id number]  Arrival date & time 07/31/12  1624   First MD Initiated Contact with Patient 07/31/12 1630      Chief Complaint  Patient presents with  . Headache    (Consider location/radiation/quality/duration/timing/severity/associated sxs/prior treatment) Patient is a 41 y.o. male presenting with headaches. The history is provided by the patient.  Headache  This is a new problem. The current episode started more than 2 days ago. The problem occurs hourly. The problem has been gradually worsening. The headache is associated with nothing. The pain is located in the left unilateral region. The pain is moderate. The pain does not radiate. Associated symptoms include anorexia and malaise/fatigue. Pertinent negatives include no fever, no near-syncope, no palpitations, no shortness of breath, no nausea and no vomiting. He has tried acetaminophen for the symptoms. The treatment provided no relief.    Past Medical History  Diagnosis Date  . Hypertension   . Asthma     History reviewed. No pertinent past surgical history.  Family History  Problem Relation Age of Onset  . Hypertension Father     History  Substance Use Topics  . Smoking status: Never Smoker   . Smokeless tobacco: Not on file  . Alcohol Use: No      Review of Systems  Constitutional: Positive for malaise/fatigue. Negative for fever and activity change.       All ROS Neg except as noted in HPI  HENT: Positive for congestion. Negative for nosebleeds and neck pain.   Eyes: Negative for photophobia and discharge.  Respiratory: Positive for wheezing. Negative for cough and shortness of breath.   Cardiovascular: Negative for chest pain, palpitations and near-syncope.  Gastrointestinal: Positive for anorexia. Negative for nausea, vomiting, abdominal pain and blood in stool.  Genitourinary: Negative for dysuria, frequency and hematuria.  Musculoskeletal: Negative for back pain and arthralgias.  Skin:  Negative.   Neurological: Positive for headaches. Negative for dizziness, seizures and speech difficulty.  Psychiatric/Behavioral: Negative for hallucinations and confusion.    Allergies  Aspirin  Home Medications   Current Outpatient Rx  Name  Route  Sig  Dispense  Refill  . ALBUTEROL SULFATE HFA 108 (90 BASE) MCG/ACT IN AERS   Inhalation   Inhale 2 puffs into the lungs every 4 (four) hours as needed. For wheezing.         Marland Kitchen BISOPROLOL-HYDROCHLOROTHIAZIDE 2.5-6.25 MG PO TABS   Oral   Take 1 tablet by mouth daily.         Marland Kitchen HYDROCODONE-ACETAMINOPHEN 5-325 MG PO TABS   Oral   Take 1 tablet by mouth every 4 (four) hours as needed for pain.   20 tablet   0   . PREDNISONE 10 MG PO TABS      6,5,4,3,2,1 - take with food   21 tablet   0   . PSEUDOEPHEDRINE HCL 60 MG PO TABS      1 po tid for congestion   30 tablet   0     BP 138/92  Pulse 107  Temp 98.5 F (36.9 C) (Oral)  Resp 20  Ht 5\' 7"  (1.702 m)  Wt 228 lb (103.42 kg)  BMI 35.71 kg/m2  SpO2 97%  Physical Exam  Nursing note and vitals reviewed. Constitutional: He is oriented to person, place, and time. He appears well-developed and well-nourished.  Non-toxic appearance.  HENT:  Head: Normocephalic.  Right Ear: Tympanic membrane and external ear normal.  Left Ear: Tympanic membrane and external  ear normal.       Nasal congestion. Mild pain to percussion of the sinuses.  No hot areas of the face.  Eyes: EOM and lids are normal. Pupils are equal, round, and reactive to light.  Neck: Normal range of motion. Neck supple. Carotid bruit is not present.  Cardiovascular: Normal rate, regular rhythm, normal heart sounds, intact distal pulses and normal pulses.   Pulmonary/Chest: Breath sounds normal. No respiratory distress.  Abdominal: Soft. Bowel sounds are normal. There is no tenderness. There is no guarding.  Musculoskeletal: Normal range of motion.  Lymphadenopathy:       Head (right side): No  submandibular adenopathy present.       Head (left side): No submandibular adenopathy present.    He has no cervical adenopathy.  Neurological: He is alert and oriented to person, place, and time. He has normal strength. No cranial nerve deficit or sensory deficit.       Gait steady. No gross neurologic deficits appreciated.  Skin: Skin is warm and dry.  Psychiatric: He has a normal mood and affect. His speech is normal.    ED Course  Procedures (including critical care time)  Labs Reviewed - No data to display No results found. Pulse symmetry 97% on room air. Within normal limits by my interpretation.  1. Sinus headache       MDM  Vital signs wnl. Pt examined. Previous records reviewed.  And patient presents to the emergency department with 3 days of headache mostly behind the eye and in the sinus area. Patient thinks he may have had some fever on the first day but has not had fever since that time. The patient is treated with Sudafed 3 times daily, prednisone taper, and Norco for headache. Patient advised to return to the emergency department if any changes, problems, or concerns.       Lenox Ahr, [place] 07/31/12 1700

## 2012-07-31 NOTE — ED Notes (Signed)
Pt seen and evaluated by EDPa for initial assessment. 

## 2012-07-31 NOTE — ED Provider Notes (Signed)
Medical screening examination/treatment/procedure(s) were performed by non-physician practitioner and as supervising physician I was immediately available for consultation/collaboration.  Carmin Muskrat, MD 07/31/12 2005

## 2012-10-09 ENCOUNTER — Emergency Department (HOSPITAL_COMMUNITY)
Admission: EM | Admit: 2012-10-09 | Discharge: 2012-10-09 | Disposition: A | Discharge status: Home / Self Care | Payer: Self-pay | Attending: Emergency Medicine | Admitting: Emergency Medicine

## 2012-10-09 ENCOUNTER — Emergency Department (HOSPITAL_COMMUNITY): Payer: Self-pay

## 2012-10-09 ENCOUNTER — Encounter (HOSPITAL_COMMUNITY): Payer: Self-pay | Admitting: *Deleted

## 2012-10-09 DIAGNOSIS — R059 Cough, unspecified: Secondary | ICD-10-CM | POA: Insufficient documentation

## 2012-10-09 DIAGNOSIS — I10 Essential (primary) hypertension: Secondary | ICD-10-CM | POA: Insufficient documentation

## 2012-10-09 DIAGNOSIS — B349 Viral infection, unspecified: Secondary | ICD-10-CM

## 2012-10-09 DIAGNOSIS — J3489 Other specified disorders of nose and nasal sinuses: Secondary | ICD-10-CM | POA: Insufficient documentation

## 2012-10-09 DIAGNOSIS — R05 Cough: Secondary | ICD-10-CM | POA: Insufficient documentation

## 2012-10-09 DIAGNOSIS — H9209 Otalgia, unspecified ear: Secondary | ICD-10-CM | POA: Insufficient documentation

## 2012-10-09 DIAGNOSIS — J029 Acute pharyngitis, unspecified: Secondary | ICD-10-CM | POA: Insufficient documentation

## 2012-10-09 DIAGNOSIS — Z79899 Other long term (current) drug therapy: Secondary | ICD-10-CM | POA: Insufficient documentation

## 2012-10-09 DIAGNOSIS — J45909 Unspecified asthma, uncomplicated: Secondary | ICD-10-CM | POA: Insufficient documentation

## 2012-10-09 DIAGNOSIS — B9789 Other viral agents as the cause of diseases classified elsewhere: Secondary | ICD-10-CM | POA: Insufficient documentation

## 2012-10-09 LAB — RAPID STREP SCREEN (MED CTR MEBANE ONLY): Streptococcus, Group A Screen (Direct): NEGATIVE

## 2012-10-09 NOTE — ED Provider Notes (Signed)
History     CSN: [id number]  Arrival date & time 10/09/12  40   First MD Initiated Contact with Patient 10/09/12 2002      Chief Complaint  Patient presents with  . Facial Pain  . Cough  . Sore Throat     HPI Pt was seen at 2010.   Per pt, c/o gradual onset and persistence of constant sore throat, runny/stuffy nose, sinus congestion, left ear pain, and cough for the past 3 to 4 days.  Has been associate with generalized body aches/fatigue as well as several episodes of N/V.  Denies fevers, no rash, no CP/SOB, no diarrhea, no abd pain, no black or blood in stools or emesis.     Past Medical History  Diagnosis Date  . Hypertension   . Asthma     History reviewed. No pertinent past surgical history.  Family History  Problem Relation Age of Onset  . Hypertension Father     History  Substance Use Topics  . Smoking status: Never Smoker   . Smokeless tobacco: Not on file  . Alcohol Use: No      Review of Systems ROS: Statement: All systems negative except as marked or noted in the HPI; Constitutional: Negative for fever and +chills, generalized body aches/fatigue.. ; ; Eyes: Negative for eye pain, redness and discharge. ; ; ENMT: Negative for ear pain, hoarseness, +nasal congestion, sinus pressure and sore throat. ; ; Cardiovascular: Negative for chest pain, palpitations, diaphoresis, dyspnea and peripheral edema. ; ; Respiratory: +cough. Negative for wheezing and stridor. ; ; Gastrointestinal: +N/V. Negative for diarrhea, abdominal pain, blood in stool, hematemesis, jaundice and rectal bleeding. . ; ; Genitourinary: Negative for dysuria, flank pain and hematuria. ; ; Musculoskeletal: Negative for back pain and neck pain. Negative for swelling and trauma.; ; Skin: Negative for pruritus, rash, abrasions, blisters, bruising and skin lesion.; ; Neuro: Negative for headache, lightheadedness and neck stiffness. Negative for weakness, altered level of consciousness , altered mental  status, extremity weakness, paresthesias, involuntary movement, seizure and syncope.       Allergies  Aspirin  Home Medications   Current Outpatient Rx  Name  Route  Sig  Dispense  Refill  . BISOPROLOL-HYDROCHLOROTHIAZIDE 2.5-6.25 MG PO TABS   Oral   Take 1 tablet by mouth daily.         . ALBUTEROL SULFATE HFA 108 (90 BASE) MCG/ACT IN AERS   Inhalation   Inhale 2 puffs into the lungs every 4 (four) hours as needed. For wheezing.           BP 139/90  Pulse 87  Temp 98.3 F (36.8 C) (Oral)  Resp 20  Ht 5\' 7"  (1.702 m)  Wt 228 lb (103.42 kg)  BMI 35.71 kg/m2  SpO2 98%  Physical Exam 2015: Physical examination:  Nursing notes reviewed; Vital signs and O2 SAT reviewed;  Constitutional: Well developed, Well nourished, Well hydrated, In no acute distress; Head:  Normocephalic, atraumatic; Eyes: EOMI, PERRL, No scleral icterus; ENMT: TM's clear bilat. +edemetous nasal turbinates bilat with clear rhinorrhea. Mouth and pharynx without lesions. No tonsillar exudates. No intra-oral edema. No hoarse voice, no drooling, no stridor. No pain with manipulation of larynx. Mouth and pharynx normal, Mucous membranes moist; Neck: Supple, Full range of motion, No lymphadenopathy; Cardiovascular: Regular rate and rhythm, No murmur, rub, or gallop; Respiratory: Breath sounds clear & equal bilaterally, No rales, rhonchi, wheezes.  Speaking full sentences with ease, Normal respiratory effort/excursion; Chest: Nontender, Movement normal; Abdomen:  Soft, Nontender, Nondistended, Normal bowel sounds;; Extremities: Pulses normal, No tenderness, No edema, No calf edema or asymmetry.; Neuro: AA&Ox3, Major CN grossly intact.  Speech clear. No gross focal motor or sensory deficits in extremities.; Skin: Color normal, Warm, Dry.   ED Course  Procedures    MDM  MDM Reviewed: nursing note and vitals Interpretation: labs and x-ray     Results for orders placed during the hospital encounter of 10/09/12    RAPID STREP SCREEN      Component Value Range   Streptococcus, Group A Screen (Direct) NEGATIVE  NEGATIVE   Dg Chest 2 View 10/09/2012  *RADIOLOGY REPORT*  Clinical Data: Cough.  CHEST - 2 VIEW  Comparison: 02/10/2012  Findings: Lungs clear.  Heart size and pulmonary vascularity normal.  No effusion.  Visualized bones unremarkable.  IMPRESSION: No acute disease   Original Report Authenticated By: D. Wallace Going, MD      2245:  Appears viral illness at this time; will tx symptomatically. Dx and testing d/w pt.  Questions answered.  Verb understanding, agreeable to d/c home with outpt f/u. Requesting a work note.        Alfonzo Feller, DO 10/10/12 [phone]

## 2012-10-09 NOTE — ED Notes (Signed)
Pt wanted to bypass chest xray, but EDP stated that would not be a good idea. Pt notified and is ok with her decision.

## 2012-10-09 NOTE — ED Notes (Signed)
Headache, feels tired, vomiting,dizzy, lt earache.  No diarrhea

## 2013-03-22 ENCOUNTER — Emergency Department (HOSPITAL_COMMUNITY)
Admission: EM | Admit: 2013-03-22 | Discharge: 2013-03-22 | Disposition: A | Discharge status: Home / Self Care | Payer: BC Managed Care – PPO | Attending: Emergency Medicine | Admitting: Emergency Medicine

## 2013-03-22 ENCOUNTER — Encounter (HOSPITAL_COMMUNITY): Payer: Self-pay | Admitting: *Deleted

## 2013-03-22 ENCOUNTER — Emergency Department (HOSPITAL_COMMUNITY): Payer: BC Managed Care – PPO

## 2013-03-22 DIAGNOSIS — H53149 Visual discomfort, unspecified: Secondary | ICD-10-CM | POA: Insufficient documentation

## 2013-03-22 DIAGNOSIS — F411 Generalized anxiety disorder: Secondary | ICD-10-CM | POA: Insufficient documentation

## 2013-03-22 DIAGNOSIS — R112 Nausea with vomiting, unspecified: Secondary | ICD-10-CM | POA: Insufficient documentation

## 2013-03-22 DIAGNOSIS — R450 Nervousness: Secondary | ICD-10-CM | POA: Insufficient documentation

## 2013-03-22 DIAGNOSIS — I10 Essential (primary) hypertension: Secondary | ICD-10-CM | POA: Insufficient documentation

## 2013-03-22 DIAGNOSIS — R000 Tachycardia, unspecified: Secondary | ICD-10-CM | POA: Insufficient documentation

## 2013-03-22 DIAGNOSIS — J45909 Unspecified asthma, uncomplicated: Secondary | ICD-10-CM | POA: Insufficient documentation

## 2013-03-22 DIAGNOSIS — Z79899 Other long term (current) drug therapy: Secondary | ICD-10-CM | POA: Insufficient documentation

## 2013-03-22 DIAGNOSIS — R51 Headache: Secondary | ICD-10-CM | POA: Insufficient documentation

## 2013-03-22 LAB — CBC WITH DIFFERENTIAL/PLATELET
Basophils Relative: 0 % (ref 0–1)
Eosinophils Absolute: 0.1 10*3/uL (ref 0.0–0.7)
Hemoglobin: 13.4 g/dL (ref 13.0–17.0)
MCH: 29.6 pg (ref 26.0–34.0)
MCHC: 33.7 g/dL (ref 30.0–36.0)
Monocytes Absolute: 0.5 10*3/uL (ref 0.1–1.0)
Monocytes Relative: 6 % (ref 3–12)
Neutrophils Relative %: 70 % (ref 43–77)

## 2013-03-22 LAB — BASIC METABOLIC PANEL
BUN: 13 mg/dL (ref 6–23)
Creatinine, Ser: 0.89 mg/dL (ref 0.50–1.35)
GFR calc Af Amer: >90 mL/min (ref >90)
GFR calc non Af Amer: >90 mL/min (ref >90)
Potassium: 3.9 mEq/L (ref 3.5–5.1)

## 2013-03-22 MED ORDER — DIPHENHYDRAMINE HCL 50 MG/ML IJ SOLN
25.0000 mg | Freq: Once | INTRAMUSCULAR | Status: AC
  Administered 2013-03-22: 25 mg via INTRAVENOUS
  Filled 2013-03-22: qty 1

## 2013-03-22 MED ORDER — SODIUM CHLORIDE 0.9 % IV SOLN
INTRAVENOUS | Status: DC
  Administered 2013-03-22: 11:00:00 via INTRAVENOUS

## 2013-03-22 MED ORDER — METOCLOPRAMIDE HCL 5 MG/ML IJ SOLN
10.0000 mg | Freq: Once | INTRAMUSCULAR | Status: AC
  Administered 2013-03-22: 10 mg via INTRAVENOUS
  Filled 2013-03-22: qty 2

## 2013-03-22 MED ORDER — DEXAMETHASONE SODIUM PHOSPHATE 4 MG/ML IJ SOLN
10.0000 mg | Freq: Once | INTRAMUSCULAR | Status: AC
  Administered 2013-03-22: 10 mg via INTRAVENOUS
  Filled 2013-03-22: qty 3

## 2013-03-22 NOTE — ED Notes (Signed)
Pt c/o headache that has been intermittent until 4 days ago when the pain has remained constant., did have n/v last night with the headache, pt also reports that he has been feeling anxious and felt like he has been having a fast heartbeat. Pt reports that all these symptoms started after the death of his father this past 04/08/23.

## 2013-03-22 NOTE — ED Provider Notes (Signed)
History    CSN: [id number] Arrival date & time 03/22/13  0840  First MD Initiated Contact with Patient 03/22/13 [phone]     Chief Complaint  Patient presents with  . Headache  . Anxiety   (Consider location/radiation/quality/duration/timing/severity/associated sxs/prior Treatment) Patient is a 42 y.o. male presenting with headaches and anxiety. The history is provided by the patient.  Headache Pain location:  Frontal Quality: pressure. Radiates to:  Does not radiate Severity currently:  8/10 Severity at highest:  10/10 Onset quality:  Gradual Duration:  4 days Timing:  Constant Progression:  Worsening Chronicity:  New Similar to prior headaches: no   Relieved by: aleve will help a little. Worsened by:  Light Associated symptoms: nausea, photophobia and vomiting   Associated symptoms: no back pain, no congestion, no cough, no dizziness, no ear pain, no fever, no hearing loss, no loss of balance, no neck pain, no neck stiffness, no syncope and no visual change   Anxiety Associated symptoms include headaches, nausea and vomiting. Pertinent negatives include no chills, congestion, coughing, fever, neck pain, rash or visual change.   Luis Wood is a 42 y.o. male who presents to the ED with a headache. He has had headaches in the past but not like this. Can usually take aleve and it goes away but not this time. The is the worst headache of his life. "I never had a headache like this before." Past Medical History  Diagnosis Date  . Hypertension   . Asthma    History reviewed. No pertinent past surgical history. Family History  Problem Relation Age of Onset  . Hypertension Father    History  Substance Use Topics  . Smoking status: Never Smoker   . Smokeless tobacco: Not on file  . Alcohol Use: No    Review of Systems  Constitutional: Negative for fever and chills.  HENT: Negative for hearing loss, ear pain, congestion, neck pain and neck stiffness.   Eyes: Positive  for photophobia.  Respiratory: Negative for cough.   Cardiovascular: Negative for syncope.  Gastrointestinal: Positive for nausea and vomiting.  Genitourinary: Negative for dysuria and frequency.  Musculoskeletal: Negative for back pain.  Skin: Negative for rash.  Neurological: Positive for headaches. Negative for dizziness and loss of balance.  Psychiatric/Behavioral: The patient is nervous/anxious.     Allergies  Aspirin  Home Medications   Current Outpatient Rx  Name  Route  Sig  Dispense  Refill  . albuterol (PROVENTIL HFA;VENTOLIN HFA) 108 (90 BASE) MCG/ACT inhaler   Inhalation   Inhale 2 puffs into the lungs every 4 (four) hours as needed. For wheezing.         . bisoprolol-hydrochlorothiazide (ZIAC) 2.5-6.25 MG per tablet   Oral   Take 1 tablet by mouth daily.         . naproxen sodium (ALEVE) 220 MG tablet   Oral   Take 220 mg by mouth 2 (two) times daily as needed (pain).          BP 156/65  Pulse 102  Temp(Src) 98.1 F (36.7 C) (Oral)  Resp 20  SpO2 99% Physical Exam  Nursing note and vitals reviewed. Constitutional: He is oriented to person, place, and time. He appears well-developed and well-nourished. No distress.  HENT:  Head: Normocephalic and atraumatic.  Right Ear: Tympanic membrane and external ear normal.  Left Ear: Tympanic membrane and external ear normal.  Nose: Right sinus exhibits no maxillary sinus tenderness and no frontal sinus tenderness. Left sinus  exhibits no maxillary sinus tenderness and no frontal sinus tenderness.  Mouth/Throat: Uvula is midline, oropharynx is clear and moist and mucous membranes are normal.  Eyes: Conjunctivae and EOM are normal. Pupils are equal, round, and reactive to light. Right eye exhibits no nystagmus. Left eye exhibits no nystagmus.  Neck: Normal range of motion. Neck supple. No JVD present.  Cardiovascular: Normal pulses.  Tachycardia present.   Pulmonary/Chest: Effort normal and breath sounds normal.   Abdominal: Soft. Bowel sounds are normal. There is no tenderness.  Musculoskeletal: Normal range of motion. He exhibits no edema.  Lymphadenopathy:    He has no cervical adenopathy.  Neurological: He is alert and oriented to person, place, and time. He has normal strength and normal reflexes. No cranial nerve deficit or sensory deficit. He displays a negative Romberg sign. Coordination and gait normal.  Radial and pedal pulses strong, adequate circulation, good touch sensation. Ambulatory with steady gait. Stands on one foot without difficulty.   Skin: Skin is warm and dry.  Psychiatric: He has a normal mood and affect. His behavior is normal. Judgment and thought content normal.   Results for orders placed during the hospital encounter of 03/22/13 (from the past 24 hour(s))  CBC WITH DIFFERENTIAL     Status: None   Collection Time    03/22/13  9:57 AM      Result Value Range   WBC 8.9  4.0 - 10.5 K/uL   RBC 4.52  4.22 - 5.81 MIL/uL   Hemoglobin 13.4  13.0 - 17.0 g/dL   HCT 39.8  39.0 - 52.0 %   MCV 88.1  78.0 - 100.0 fL   MCH 29.6  26.0 - 34.0 pg   MCHC 33.7  30.0 - 36.0 g/dL   RDW 12.8  11.5 - 15.5 %   Platelets 218  150 - 400 K/uL   Neutrophils Relative % 70  43 - 77 %   Neutro Abs 6.2  1.7 - 7.7 K/uL   Lymphocytes Relative 24  12 - 46 %   Lymphs Abs 2.1  0.7 - 4.0 K/uL   Monocytes Relative 6  3 - 12 %   Monocytes Absolute 0.5  0.1 - 1.0 K/uL   Eosinophils Relative 1  0 - 5 %   Eosinophils Absolute 0.1  0.0 - 0.7 K/uL   Basophils Relative 0  0 - 1 %   Basophils Absolute 0.0  0.0 - 0.1 K/uL  BASIC METABOLIC PANEL     Status: Abnormal   Collection Time    03/22/13  9:57 AM      Result Value Range   Sodium 134 (*) 135 - 145 mEq/L   Potassium 3.9  3.5 - 5.1 mEq/L   Chloride 94 (*) 96 - 112 mEq/L   CO2 28  19 - 32 mEq/L   Glucose, Bld 340 (*) 70 - 99 mg/dL   BUN 13  6 - 23 mg/dL   Creatinine, Ser 0.89  0.50 - 1.35 mg/dL   Calcium 9.7  8.4 - 10.5 mg/dL   GFR calc non Af  Amer >90  >90 mL/min   GFR calc Af Amer >90  >90 mL/min    ED Course  Procedures Ct Head Wo Contrast  03/22/2013   *RADIOLOGY REPORT*  Clinical Data: Headache  CT HEAD WITHOUT CONTRAST  Technique:  Contiguous axial images were obtained from the base of the skull through the vertex without contrast.  Comparison: 09/26/2009  Findings: No mass effect, midline shift,  or acute intracranial hemorrhage.  Mastoid air cells and visualized paranasal sinuses are clear.  Cranium is intact.  IMPRESSION: No acute intracranial pathology.   Original Report Authenticated By: Marybelle Killings, M.D.    11:55 am patient sleeping, re evaluated. Woke patient, he states that his headache is 2/10 and he feels relaxed. He is ready to go home and rest.  Will give work note for today. Patient states he can follow up with his PCP, Dr. Nevada Crane this week. MDM  EKG: ekg findings:315101::"normal EKG, normal sinus rhythm" no previous EKG. 42 y.o. male with headache that has resolved after IV fluids, Decadron 10 mg IV, Benadryl 25 mg IV and Reglan 10 mg IV.  I have reviewed this patient's vital signs, nurses notes, appropriate labs and imaging.  I have discussed findings with the patient and plan of care. He will follow up with his PCP this week.   Ashley Murrain, [place] 03/22/13 1705

## 2013-03-22 NOTE — ED Notes (Signed)
Pt also reports that he has been having problems with right side upper abd pain for the past 3-4 months.

## 2013-03-22 NOTE — ED Notes (Signed)
nad noted prior to dc. Dc instructions reviewed and explained. Ambulated out without difficulty.

## 2013-03-26 NOTE — ED Provider Notes (Signed)
Medical screening examination/treatment/procedure(s) were performed by non-physician practitioner and as supervising physician I was immediately available for consultation/collaboration.   Mervin Kung, MD 03/26/13 [phone]

## 2013-12-10 ENCOUNTER — Encounter (HOSPITAL_COMMUNITY): Payer: Self-pay | Admitting: Emergency Medicine

## 2013-12-10 ENCOUNTER — Emergency Department (HOSPITAL_COMMUNITY)
Admission: EM | Admit: 2013-12-10 | Discharge: 2013-12-10 | Disposition: A | Discharge status: Home / Self Care | Payer: BC Managed Care – PPO | Attending: Emergency Medicine | Admitting: Emergency Medicine

## 2013-12-10 DIAGNOSIS — I10 Essential (primary) hypertension: Secondary | ICD-10-CM | POA: Insufficient documentation

## 2013-12-10 DIAGNOSIS — J069 Acute upper respiratory infection, unspecified: Secondary | ICD-10-CM | POA: Insufficient documentation

## 2013-12-10 DIAGNOSIS — J45909 Unspecified asthma, uncomplicated: Secondary | ICD-10-CM | POA: Insufficient documentation

## 2013-12-10 DIAGNOSIS — Z79899 Other long term (current) drug therapy: Secondary | ICD-10-CM | POA: Insufficient documentation

## 2013-12-10 DIAGNOSIS — H9209 Otalgia, unspecified ear: Secondary | ICD-10-CM | POA: Insufficient documentation

## 2013-12-10 LAB — RAPID STREP SCREEN (MED CTR MEBANE ONLY): STREPTOCOCCUS, GROUP A SCREEN (DIRECT): NEGATIVE

## 2013-12-10 NOTE — ED Notes (Signed)
Pt c/o generalized body aches and pains, sore throat, fever yesterday, sinus congestion, nausea, no vomiting or diarrhea.

## 2013-12-10 NOTE — Discharge Instructions (Signed)
Ibuprofen 600 mg every 6 hours as needed for pain or fever.  Drink plenty of fluids and get plenty of rest. If you're not improving in the next week, followup with your primary Dr. to be reevaluated.  Return to the ER if you develop difficulty breathing, chest pain, or other new or bothersome symptoms.   Viral Infections A viral infection can be caused by different types of viruses.Most viral infections are not serious and resolve on their own. However, some infections may cause severe symptoms and may lead to further complications. SYMPTOMS Viruses can frequently cause:  Minor sore throat.  Aches and pains.  Headaches.  Runny nose.  Different types of rashes.  Watery eyes.  Tiredness.  Cough.  Loss of appetite.  Gastrointestinal infections, resulting in nausea, vomiting, and diarrhea. These symptoms do not respond to antibiotics because the infection is not caused by bacteria. However, you might catch a bacterial infection following the viral infection. This is sometimes called a "superinfection." Symptoms of such a bacterial infection may include:  Worsening sore throat with pus and difficulty swallowing.  Swollen neck glands.  Chills and a high or persistent fever.  Severe headache.  Tenderness over the sinuses.  Persistent overall ill feeling (malaise), muscle aches, and tiredness (fatigue).  Persistent cough.  Yellow, green, or brown mucus production with coughing. HOME CARE INSTRUCTIONS   Only take over-the-counter or prescription medicines for pain, discomfort, diarrhea, or fever as directed by your caregiver.  Drink enough water and fluids to keep your urine clear or pale yellow. Sports drinks can provide valuable electrolytes, sugars, and hydration.  Get plenty of rest and maintain proper nutrition. Soups and broths with crackers or rice are fine. SEEK IMMEDIATE MEDICAL CARE IF:   You have severe headaches, shortness of breath, chest pain, neck pain,  or an unusual rash.  You have uncontrolled vomiting, diarrhea, or you are unable to keep down fluids.  You or your child has an oral temperature above 102 F (38.9 C), not controlled by medicine.  Your baby is older than 3 months with a rectal temperature of 102 F (38.9 C) or higher.  Your baby is 28 months old or younger with a rectal temperature of 100.4 F (38 C) or higher. MAKE SURE YOU:   Understand these instructions.  Will watch your condition.  Will get help right away if you are not doing well or get worse. Document Released: 06/21/2005 Document Revised: 12/04/2011 Document Reviewed: 01/16/2011 [facility] Patient Information 2014 [place], [place].  Upper Respiratory Infection, Adult An upper respiratory infection (URI) is also known as the common cold. It is often caused by a type of germ (virus). Colds are easily spread (contagious). You can pass it to others by kissing, coughing, sneezing, or drinking out of the same glass. Usually, you get better in 1 or 2 weeks.  HOME CARE   Only take medicine as told by your doctor.  Use a warm mist humidifier or breathe in steam from a hot shower.  Drink enough water and fluids to keep your pee (urine) clear or pale yellow.  Get plenty of rest.  Return to work when your temperature is back to normal or as told by your doctor. You may use a face mask and wash your hands to stop your cold from spreading. GET HELP RIGHT AWAY IF:   After the first few days, you feel you are getting worse.  You have questions about your medicine.  You have chills, shortness of breath, or  brown or red spit (mucus).  You have yellow or brown snot (nasal discharge) or pain in the face, especially when you bend forward.  You have a fever, puffy (swollen) neck, pain when you swallow, or white spots in the back of your throat.  You have a bad headache, ear pain, sinus pain, or chest pain.  You have a high-pitched whistling sound when you breathe in  and out (wheezing).  You have a lasting cough or cough up blood.  You have sore muscles or a stiff neck. MAKE SURE YOU:   Understand these instructions.  Will watch your condition.  Will get help right away if you are not doing well or get worse. Document Released: 02/28/2008 Document Revised: 12/04/2011 Document Reviewed: 01/16/2011 [facility] Patient Information 2014 [place], [place].

## 2013-12-10 NOTE — ED Provider Notes (Signed)
CSN: [id number]     Arrival date & time 12/10/13  0603 History   First MD Initiated Contact with Patient 12/10/13 [phone]     Chief Complaint  Patient presents with  . Generalized Body Aches  . Fever     (Consider location/radiation/quality/duration/timing/severity/associated sxs/prior Treatment) HPI Comments: Patient is a 43 year old male who presents with complaints of congestion, cough, sore throat, generalized aches and pains for the past 2 days. He denies ill contacts.  Patient is a 43 y.o. male presenting with URI. The history is provided by the patient.  URI Presenting symptoms: congestion, cough, ear pain, fatigue, fever, rhinorrhea and sore throat   Severity:  Moderate Onset quality:  Gradual Duration:  2 days Timing:  Constant Progression:  Worsening Chronicity:  New Relieved by:  Nothing Worsened by:  Nothing tried Ineffective treatments:  None tried   Past Medical History  Diagnosis Date  . Hypertension   . Asthma    History reviewed. No pertinent past surgical history. Family History  Problem Relation Age of Onset  . Hypertension Father    History  Substance Use Topics  . Smoking status: Never Smoker   . Smokeless tobacco: Not on file  . Alcohol Use: No    Review of Systems  Constitutional: Positive for fever and fatigue.  HENT: Positive for congestion, ear pain, rhinorrhea and sore throat.   Respiratory: Positive for cough.   All other systems reviewed and are negative.      Allergies  Aspirin  Home Medications   Current Outpatient Rx  Name  Route  Sig  Dispense  Refill  . albuterol (PROVENTIL HFA;VENTOLIN HFA) 108 (90 BASE) MCG/ACT inhaler   Inhalation   Inhale 2 puffs into the lungs every 4 (four) hours as needed. For wheezing.         . bisoprolol-hydrochlorothiazide (ZIAC) 2.5-6.25 MG per tablet   Oral   Take 1 tablet by mouth daily.         . naproxen sodium (ALEVE) 220 MG tablet   Oral   Take 220 mg by mouth 2 (two) times  daily as needed (pain).          BP 167/96  Pulse 85  Temp(Src) 98.4 F (36.9 C) (Oral)  Resp 16  Ht 5\' 7"  (1.702 m)  Wt 218 lb (98.884 kg)  BMI 34.14 kg/m2  SpO2 98% Physical Exam  Nursing note and vitals reviewed. Constitutional: He is oriented to person, place, and time. He appears well-developed and well-nourished. No distress.  HENT:  Head: Normocephalic and atraumatic.  The posterior oropharynx is mildly erythematous without exudates.  Neck: Normal range of motion. Neck supple.  Cardiovascular: Normal rate, regular rhythm and normal heart sounds.   No murmur heard. Pulmonary/Chest: Effort normal and breath sounds normal. No respiratory distress. He has no wheezes.  Abdominal: Soft. Bowel sounds are normal. He exhibits no distension. There is no tenderness.  Musculoskeletal: Normal range of motion. He exhibits no edema.  Lymphadenopathy:    He has no cervical adenopathy.  Neurological: He is alert and oriented to person, place, and time.  Skin: Skin is warm and dry. He is not diaphoretic.    ED Course  Procedures (including critical care time) Labs Review Labs Reviewed  RAPID STREP SCREEN   Imaging Review No results found.    MDM   Final diagnoses:  None    Patient presents here with complaints of generalized aches and pains and URI symptoms. This is been going on for  2 days and strep test is negative. I suspect a viral etiology. I will recommend fluids, ibuprofen and Tylenol, plenty of rest and time.    Veryl Speak, MD 12/10/13 [phone]

## 2013-12-12 LAB — CULTURE, GROUP A STREP

## 2015-07-26 ENCOUNTER — Encounter (HOSPITAL_COMMUNITY): Payer: Self-pay | Admitting: Emergency Medicine

## 2015-07-26 ENCOUNTER — Emergency Department (HOSPITAL_COMMUNITY)
Admission: EM | Admit: 2015-07-26 | Discharge: 2015-07-26 | Disposition: A | Discharge status: Home / Self Care | Payer: No Typology Code available for payment source | Attending: Emergency Medicine | Admitting: Emergency Medicine

## 2015-07-26 ENCOUNTER — Emergency Department (HOSPITAL_COMMUNITY): Payer: No Typology Code available for payment source

## 2015-07-26 DIAGNOSIS — J45909 Unspecified asthma, uncomplicated: Secondary | ICD-10-CM | POA: Insufficient documentation

## 2015-07-26 DIAGNOSIS — S46912A Strain of unspecified muscle, fascia and tendon at shoulder and upper arm level, left arm, initial encounter: Secondary | ICD-10-CM | POA: Diagnosis not present

## 2015-07-26 DIAGNOSIS — S199XXA Unspecified injury of neck, initial encounter: Secondary | ICD-10-CM | POA: Insufficient documentation

## 2015-07-26 DIAGNOSIS — M7531 Calcific tendinitis of right shoulder: Secondary | ICD-10-CM | POA: Diagnosis not present

## 2015-07-26 DIAGNOSIS — I10 Essential (primary) hypertension: Secondary | ICD-10-CM | POA: Insufficient documentation

## 2015-07-26 DIAGNOSIS — Z79899 Other long term (current) drug therapy: Secondary | ICD-10-CM | POA: Diagnosis not present

## 2015-07-26 DIAGNOSIS — Y92410 Unspecified street and highway as the place of occurrence of the external cause: Secondary | ICD-10-CM | POA: Diagnosis not present

## 2015-07-26 DIAGNOSIS — S0000XA Unspecified superficial injury of scalp, initial encounter: Secondary | ICD-10-CM | POA: Diagnosis not present

## 2015-07-26 DIAGNOSIS — Z791 Long term (current) use of non-steroidal anti-inflammatories (NSAID): Secondary | ICD-10-CM | POA: Insufficient documentation

## 2015-07-26 DIAGNOSIS — Y998 Other external cause status: Secondary | ICD-10-CM | POA: Diagnosis not present

## 2015-07-26 DIAGNOSIS — S46812A Strain of other muscles, fascia and tendons at shoulder and upper arm level, left arm, initial encounter: Secondary | ICD-10-CM

## 2015-07-26 DIAGNOSIS — S4992XA Unspecified injury of left shoulder and upper arm, initial encounter: Secondary | ICD-10-CM | POA: Diagnosis present

## 2015-07-26 DIAGNOSIS — Y9389 Activity, other specified: Secondary | ICD-10-CM | POA: Diagnosis not present

## 2015-07-26 MED ORDER — TRAMADOL HCL 50 MG PO TABS: ORAL_TABLET | ORAL | Status: DC

## 2015-07-26 MED ORDER — METHOCARBAMOL 500 MG PO TABS: 500.0000 mg | ORAL_TABLET | Freq: Three times a day (TID) | ORAL | Status: DC

## 2015-07-26 NOTE — ED Notes (Signed)
Discharge instructions reviewed with pt - discussed pain meds and non pharmacutical pain relief methods. Ambulated off unit by self
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
## 2016-03-06 ENCOUNTER — Emergency Department (HOSPITAL_COMMUNITY)
Admission: EM | Admit: 2016-03-06 | Discharge: 2016-03-06 | Disposition: A | Discharge status: Home / Self Care | Payer: BLUE CROSS/BLUE SHIELD | Attending: Emergency Medicine | Admitting: Emergency Medicine

## 2016-03-06 ENCOUNTER — Encounter (HOSPITAL_COMMUNITY): Payer: Self-pay | Admitting: Emergency Medicine

## 2016-03-06 DIAGNOSIS — J0100 Acute maxillary sinusitis, unspecified: Secondary | ICD-10-CM | POA: Insufficient documentation

## 2016-03-06 DIAGNOSIS — R51 Headache: Secondary | ICD-10-CM | POA: Diagnosis not present

## 2016-03-06 DIAGNOSIS — Z79899 Other long term (current) drug therapy: Secondary | ICD-10-CM | POA: Insufficient documentation

## 2016-03-06 DIAGNOSIS — H9391 Unspecified disorder of right ear: Secondary | ICD-10-CM | POA: Diagnosis present

## 2016-03-06 DIAGNOSIS — J45909 Unspecified asthma, uncomplicated: Secondary | ICD-10-CM | POA: Diagnosis not present

## 2016-03-06 DIAGNOSIS — I10 Essential (primary) hypertension: Secondary | ICD-10-CM | POA: Diagnosis not present

## 2016-03-06 MED ORDER — CETIRIZINE HCL 5 MG/5ML PO SYRP: 5.0000 mg | ORAL_SOLUTION | Freq: Every day | ORAL | Status: DC

## 2016-03-06 NOTE — ED Notes (Signed)
Patient given discharge instruction, verbalized understand. Patient ambulatory out of the department.  

## 2016-03-06 NOTE — ED Provider Notes (Signed)
CSN: [id number]     Arrival date & time 03/06/16  [id number] History  By signing my name below, I, Roxine Caddy, attest that this documentation has been prepared under the direction and in the presence of Elnora Morrison, MD.  Electronically signed: Roxine Caddy, ED Scribe. 03/06/2016. 10:43 AM.    Chief Complaint  Patient presents with  . Ear Fullness   The history is provided by the patient. No language interpreter was used.   HPI Comments: Luis Wood is a 45 y.o. male with PMHx of asthma who presents to the Emergency Department complaining of sudden onset, constant, "clogging" sensation in the right ear which began last night. He reports associated intermittent dizziness, eye pressure, headache with pain to the top of his head that began ~ 0300 this AM. He also notes nasal congestion, sinus pressure, rhinorrhea, and watery eyes. He reports h/o sinus issues. Pt states that he has not taken allergy medications. He denies fever or sore throat. Pt is allergic to aspirin.  Past Medical History  Diagnosis Date  . Hypertension   . Asthma    History reviewed. No pertinent past surgical history. Family History  Problem Relation Age of Onset  . Hypertension Father    Social History  Substance Use Topics  . Smoking status: Never Smoker   . Smokeless tobacco: None  . Alcohol Use: No    Review of Systems  Constitutional: Negative for fever.  HENT: Positive for congestion, ear pain, rhinorrhea and sinus pressure. Negative for sore throat.   Eyes: Positive for pain (pressure).  Respiratory: Negative for shortness of breath.   Cardiovascular: Negative for chest pain.  Neurological: Positive for dizziness and headaches.  All other systems reviewed and are negative.   Allergies  Aspirin  Home Medications   Prior to Admission medications   Medication Sig Start Date End Date Taking? Authorizing Provider  albuterol (PROVENTIL HFA;VENTOLIN HFA) 108 (90 BASE) MCG/ACT inhaler Inhale 2 puffs  into the lungs every 4 (four) hours as needed. For wheezing.   Yes Historical Provider, MD  bisoprolol-hydrochlorothiazide (ZIAC) 2.5-6.25 MG per tablet Take 1 tablet by mouth daily.   Yes Historical Provider, MD  Multiple Vitamins-Minerals (MEGA MULTI MEN PO) Take 1 tablet by mouth daily.   Yes Historical Provider, MD  cetirizine HCl (ZYRTEC) 5 MG/5ML SYRP Take 5 mLs (5 mg total) by mouth daily. 03/06/16   Elnora Morrison, MD   BP 158/98 mmHg  Pulse 83  Temp(Src) 98.2 F (36.8 C) (Oral)  Resp 18  Ht 5\' 7"  (1.702 m)  Wt 228 lb (103.42 kg)  BMI 35.70 kg/m2  SpO2 100% Physical Exam  Constitutional: He is oriented to person, place, and time. He appears well-developed and well-nourished. No distress.  HENT:  Head: Normocephalic and atraumatic.  Left Ear: External ear normal.  Mouth/Throat: Oropharynx is clear and moist. No oropharyngeal exudate.  Right external ear canal is clear, good light reflex; no sign of infection Mild fluid behind right TM Pressure sensation in maxillary sinus region  Eyes: Conjunctivae and EOM are normal.  No nystagmus  Neck: Normal range of motion. Neck supple.  Cardiovascular: Normal rate and normal heart sounds.   Pulmonary/Chest: Effort normal and breath sounds normal. No respiratory distress. He has no wheezes. He has no rales.  Abdominal: He exhibits no distension.  Neurological: He is alert and oriented to person, place, and time. No cranial nerve deficit or sensory deficit. GCS eye subscore is 4. GCS verbal subscore is 5. GCS motor  subscore is 6.  Finger to nose intact  Skin: Skin is warm and dry.  Psychiatric: He has a normal mood and affect.  Nursing note and vitals reviewed.   ED Course  Procedures  DIAGNOSTIC STUDIES: Oxygen Saturation is 100% on RA, normal by my interpretation.  COORDINATION OF CARE: 10:43 AM Discussed treatment plan which includes cleaning sinuses with sea salt solution 3-4 times a days long as symptoms persist and prescribing  allergy medication with pt at bedside and pt agreed to plan.   MDM   Final diagnoses:  Acute maxillary sinusitis, recurrence not specified   Well-appearing male presents with clinically sinus pressure. Discussed likely allergy however may also be viral. No signs of bacterial distended. Discussed supportive care.  Results and differential diagnosis were discussed with the patient/parent/guardian. Xrays were independently reviewed by myself.  Close follow up outpatient was discussed, comfortable with the plan.   Medications - No data to display  Filed Vitals:   03/06/16 0931  BP: 158/98  Pulse: 83  Temp: 98.2 F (36.8 C)  TempSrc: Oral  Resp: 18  Height: 5\' 7"  (1.702 m)  Weight: 228 lb (103.42 kg)  SpO2: 100%    Final diagnoses:  Acute maxillary sinusitis, recurrence not specified      Elnora Morrison, MD 03/06/16 1136

## 2016-03-06 NOTE — ED Notes (Signed)
Pt states he is having right ear fullness with decreased hearing, headache, and sore throat.  Began 2 days ago with intermittent dizziness.

## 2016-03-06 NOTE — Discharge Instructions (Signed)
Try homemade netty pott (warm water and salt solution) to clear nasal and sinus drainage.  If you were given medicines take as directed.  If you are on coumadin or contraceptives realize their levels and effectiveness is altered by many different medicines.  If you have any reaction (rash, tongues swelling, other) to the medicines stop taking and see a physician.    If your blood pressure was elevated in the ER make sure you follow up for management with a primary doctor or return for chest pain, shortness of breath or stroke symptoms.  Please follow up as directed and return to the ER or see a physician for new or worsening symptoms.  Thank you. Filed Vitals:   03/06/16 0931  BP: 158/98  Pulse: 83  Temp: 98.2 F (36.8 C)  TempSrc: Oral  Resp: 18  Height: 5\' 7"  (1.702 m)  Weight: 228 lb (103.42 kg)  SpO2: 100%    Sinus Rinse WHAT IS A SINUS RINSE? A sinus rinse is a home treatment. It rinses your sinuses with a mixture of salt and water (saline solution). Sinuses are air-filled spaces in your skull behind the bones of your face and forehead. They open into your nasal cavity. To do a sinus rinse, you will need:  Saline solution.  Neti pot or spray bottle. This releases the saline solution into your nose and through your sinuses. You can buy neti pots and spray bottles at:  Your local pharmacy.  A health food store.  Online. WHEN WOULD I DO A SINUS RINSE?  A sinus rinse can help to clear your nasal cavity. It can clear:   Mucus.  Dirt.  Dust.  Pollen. You may do a sinus rinse when you have:  A cold.  A virus.  Allergies.  A sinus infection.  A stuffy nose. If you are considering a sinus rinse:  Ask your child's doctor before doing a sinus rinse on your child.  Do not do a sinus rinse if you have had:  Ear or nasal surgery.  An ear infection.  Blocked ears. HOW DO I DO A SINUS RINSE?   Wash your hands.  Disinfect your device using the directions that  came with the device.  Dry your device.  Use the solution that comes with your device or one that is sold separately in stores. Follow the mixing directions on the package.  Fill your device with the amount of saline solution as stated in the device instructions.  Stand over a sink and tilt your head sideways over the sink.  Place the spout of the device in your upper nostril (the one closer to the ceiling).  Gently pour or squeeze the saline solution into the nasal cavity. The liquid should drain to the lower nostril if you are not too congested.  Gently blow your nose. Blowing too hard may cause ear pain.  Repeat in the other nostril.  Clean and rinse your device with clean water.  Air-dry your device. ARE THERE RISKS OF A SINUS RINSE?  Sinus rinse is normally very safe and helpful. However, there are a few risks, which include:   A burning feeling in the sinuses. This may happen if you do not make the saline solution as instructed. Make sure to follow all directions when making the saline solution.  Infection from unclean water. This is rare, but possible.  Nasal irritation.   This information is not intended to replace advice given to you by your health care provider. Make  sure you discuss any questions you have with your health care provider.   Document Released: 04/08/2014 Document Reviewed: 04/08/2014 Elsevier Interactive Patient Education [employer].

## 2016-03-06 NOTE — ED Notes (Signed)
Pt reports sneezing, right ear fullness, eye pain, and head pressure

## 2016-08-31 ENCOUNTER — Encounter (HOSPITAL_COMMUNITY): Payer: Self-pay | Admitting: Emergency Medicine

## 2016-08-31 ENCOUNTER — Emergency Department (HOSPITAL_COMMUNITY)
Admission: EM | Admit: 2016-08-31 | Discharge: 2016-08-31 | Disposition: A | Discharge status: Home / Self Care | Payer: BLUE CROSS/BLUE SHIELD | Attending: Emergency Medicine | Admitting: Emergency Medicine

## 2016-08-31 DIAGNOSIS — J45909 Unspecified asthma, uncomplicated: Secondary | ICD-10-CM | POA: Insufficient documentation

## 2016-08-31 DIAGNOSIS — Z79899 Other long term (current) drug therapy: Secondary | ICD-10-CM | POA: Diagnosis not present

## 2016-08-31 DIAGNOSIS — J029 Acute pharyngitis, unspecified: Secondary | ICD-10-CM | POA: Diagnosis present

## 2016-08-31 DIAGNOSIS — J329 Chronic sinusitis, unspecified: Secondary | ICD-10-CM | POA: Diagnosis not present

## 2016-08-31 DIAGNOSIS — I10 Essential (primary) hypertension: Secondary | ICD-10-CM | POA: Insufficient documentation

## 2016-08-31 MED ORDER — FEXOFENADINE-PSEUDOEPHED ER 60-120 MG PO TB12: 1.0000 | ORAL_TABLET | Freq: Two times a day (BID) | ORAL | 0 refills | Status: DC | PRN

## 2016-08-31 NOTE — ED Triage Notes (Signed)
Pt reports sore throat, sinus pressure, and cough for the past 3 days.

## 2016-08-31 NOTE — ED Provider Notes (Signed)
[facility] DEPT Provider Note   CSN: [id number] Arrival date & time: 08/31/16  6160  By signing my name below, I, Rayna Sexton, attest that this documentation has been prepared under the direction and in the presence of Virgel Manifold, MD. Electronically Signed: Rayna Sexton, ED Scribe. 08/31/16. 9:35 AM.   History   Chief Complaint Chief Complaint  Patient presents with  . Sore Throat    HPI HPI Comments: Luis Wood is a 45 y.o. male who presents to the Emergency Department complaining of constant, moderate, sore throat x 3 days. He reports associated sinus congestion, sinus pressure, rhinorrhea, cough, sneeze, frontal HA and mild wheezing at night. He states he has a h/o of asthma and that his wheezing is consistent with prior asthma exacerbations. He has taken Aleve and Robitussin w/o significant relief. Pt confirms recent sick contacts. Pt denies SOB, fever or other associated symptoms at this time.   The history is provided by the patient and medical records. No language interpreter was used.    Past Medical History:  Diagnosis Date  . Asthma   . Hypertension     Patient Active Problem List   Diagnosis Date Noted  . SINUSITIS, ACUTE 07/11/2010  . BRONCHITIS, ACUTE 07/11/2010  . HYPERTENSION 04/25/2010  . ASTHMA 04/25/2010  . [place], RIGHT 04/25/2010    History reviewed. No pertinent surgical history.     Home Medications    Prior to Admission medications   Medication Sig Start Date End Date Taking? Authorizing Provider  albuterol (PROVENTIL HFA;VENTOLIN HFA) 108 (90 BASE) MCG/ACT inhaler Inhale 2 puffs into the lungs every 4 (four) hours as needed. For wheezing.    Historical Provider, MD  bisoprolol-hydrochlorothiazide [facility]) 2.5-6.25 MG per tablet Take 1 tablet by mouth daily.    Historical Provider, MD  cetirizine HCl (ZYRTEC) 5 MG/5ML SYRP Take 5 mLs (5 mg total) by mouth daily. 03/06/16   Elnora Morrison, MD  Multiple Vitamins-Minerals  (MEGA MULTI MEN PO) Take 1 tablet by mouth daily.    Historical Provider, MD    Family History Family History  Problem Relation Age of Onset  . Hypertension Father     Social History Social History  Substance Use Topics  . Smoking status: Never Smoker  . Smokeless tobacco: Not on file  . Alcohol use No     Allergies   Aspirin   Review of Systems Review of Systems  Constitutional: Negative for chills and fever.  HENT: Positive for congestion, rhinorrhea, sinus pressure, sneezing and sore throat.   Respiratory: Positive for cough and wheezing. Negative for shortness of breath.   Neurological: Positive for headaches.  All other systems reviewed and are negative.  Physical Exam Updated Vital Signs BP 152/87 (BP Location: Right Arm)   Pulse 91   Temp 98.7 F (37.1 C) (Oral)   Resp 18   Ht 5\' 7"  (1.702 m)   Wt 238 lb (108 kg)   SpO2 96%   BMI 37.28 kg/m   Physical Exam  Constitutional: He is oriented to person, place, and time. He appears well-developed and well-nourished.  HENT:  Head: Normocephalic and atraumatic.  TTP over frontal and maxillary sinuses  Eyes: EOM are normal.  Neck: Normal range of motion.  Cardiovascular: Normal rate, regular rhythm, normal heart sounds and intact distal pulses.   Pulmonary/Chest: Effort normal and breath sounds normal. No respiratory distress.  Abdominal: Soft. He exhibits no distension. There is no tenderness.  Musculoskeletal: Normal range of motion.  Neurological: He  is alert and oriented to person, place, and time.  Skin: Skin is warm and dry.  Psychiatric: He has a normal mood and affect. Judgment normal.  Nursing note and vitals reviewed.  ED Treatments / Results  Labs (all labs ordered are listed, but only abnormal results are displayed) Labs Reviewed - No data to display  EKG  EKG Interpretation None       Radiology No results found.  Procedures Procedures  DIAGNOSTIC STUDIES: Oxygen Saturation is 96%  on RA, normal by my interpretation.    COORDINATION OF CARE: 9:35 AM Discussed next steps with pt. Pt verbalized understanding and is agreeable with the plan.    Medications Ordered in ED Medications - No data to display   Initial Impression / Assessment and Plan / ED Course  I have reviewed the triage vital signs and the nursing notes.  Pertinent labs & imaging results that were available during my care of the patient were reviewed by me and considered in my medical decision making (see chart for details).  Clinical Course     I personally preformed the services scribed in my presence. The recorded information has been reviewed is accurate. Virgel Manifold, MD.  Final Clinical Impressions(s) / ED Diagnoses   Final diagnoses:  Sinusitis, unspecified chronicity, unspecified location    New Prescriptions New Prescriptions   No medications on file     Virgel Manifold, MD 09/03/16 1559

## 2016-10-26 ENCOUNTER — Emergency Department (INDEPENDENT_AMBULATORY_CARE_PROVIDER_SITE_OTHER)
Admission: EM | Admit: 2016-10-26 | Discharge: 2016-10-26 | Disposition: A | Discharge status: Home / Self Care | Payer: BLUE CROSS/BLUE SHIELD | Source: Home / Self Care | Attending: Family Medicine | Admitting: Family Medicine

## 2016-10-26 ENCOUNTER — Emergency Department (INDEPENDENT_AMBULATORY_CARE_PROVIDER_SITE_OTHER): Payer: BLUE CROSS/BLUE SHIELD

## 2016-10-26 ENCOUNTER — Encounter: Payer: Self-pay | Admitting: Emergency Medicine

## 2016-10-26 DIAGNOSIS — M25511 Pain in right shoulder: Secondary | ICD-10-CM | POA: Diagnosis not present

## 2016-10-26 DIAGNOSIS — M25811 Other specified joint disorders, right shoulder: Secondary | ICD-10-CM | POA: Diagnosis not present

## 2016-10-26 DIAGNOSIS — M25611 Stiffness of right shoulder, not elsewhere classified: Secondary | ICD-10-CM

## 2016-10-26 MED ORDER — TRAMADOL HCL 50 MG PO TABS: 50.0000 mg | ORAL_TABLET | Freq: Four times a day (QID) | ORAL | 0 refills | Status: DC | PRN

## 2016-10-26 MED ORDER — METHYLPREDNISOLONE SODIUM SUCC 40 MG IJ SOLR
40.0000 mg | Freq: Once | INTRAMUSCULAR | Status: AC
  Administered 2016-10-26: 40 mg via INTRAMUSCULAR

## 2016-10-26 MED ORDER — MELOXICAM 15 MG PO TABS: 15.0000 mg | ORAL_TABLET | Freq: Every day | ORAL | 0 refills | Status: DC

## 2016-10-26 MED ORDER — PREDNISONE 20 MG PO TABS: ORAL_TABLET | ORAL | 0 refills | Status: DC
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
## 2016-10-26 NOTE — Discharge Instructions (Signed)
°  Tramadol is strong pain medication. While taking, do not drink alcohol, drive, or perform any other activities that requires focus while taking these medications.   Meloxicam (Mobic) is an antiinflammatory to help with pain and inflammation.  Do not take ibuprofen, Advil, Aleve, or any other medications that contain NSAIDs while taking meloxicam as this may cause stomach upset or even ulcers if taken in large amounts for an extended period of time.   You were given a shot of solumedrol (a steroid) today to help with muscle pain and swelling.  You have been prescribed prednisone, an oral steroid.  You may start this medication tomorrow with breakfast.

## 2016-10-26 NOTE — ED Triage Notes (Signed)
Patient reports onset of right shoulder pain 4 days ago; has limited range of motion; no known injury, but did start walking/running during this time frame. Took 2 aleve today plus topical pain meds.

## 2017-07-02 ENCOUNTER — Emergency Department (HOSPITAL_COMMUNITY)
Admission: EM | Admit: 2017-07-02 | Discharge: 2017-07-02 | Disposition: A | Discharge status: Home / Self Care | Payer: BLUE CROSS/BLUE SHIELD | Attending: Emergency Medicine | Admitting: Emergency Medicine

## 2017-07-02 ENCOUNTER — Encounter (HOSPITAL_COMMUNITY): Payer: Self-pay

## 2017-07-02 DIAGNOSIS — I10 Essential (primary) hypertension: Secondary | ICD-10-CM | POA: Diagnosis not present

## 2017-07-02 DIAGNOSIS — R21 Rash and other nonspecific skin eruption: Secondary | ICD-10-CM | POA: Diagnosis not present

## 2017-07-02 DIAGNOSIS — Z79899 Other long term (current) drug therapy: Secondary | ICD-10-CM | POA: Diagnosis not present

## 2017-07-02 DIAGNOSIS — L02214 Cutaneous abscess of groin: Secondary | ICD-10-CM | POA: Diagnosis present

## 2017-07-02 DIAGNOSIS — J45909 Unspecified asthma, uncomplicated: Secondary | ICD-10-CM | POA: Insufficient documentation

## 2017-07-02 MED ORDER — DOXYCYCLINE HYCLATE 100 MG PO CAPS: 100.0000 mg | ORAL_CAPSULE | Freq: Two times a day (BID) | ORAL | 0 refills | Status: DC

## 2017-07-02 MED ORDER — LORATADINE 10 MG PO TABS
10.0000 mg | ORAL_TABLET | Freq: Once | ORAL | Status: AC
  Administered 2017-07-02: 10 mg via ORAL
  Filled 2017-07-02: qty 1

## 2017-07-02 MED ORDER — DOXYCYCLINE HYCLATE 100 MG PO TABS
100.0000 mg | ORAL_TABLET | Freq: Once | ORAL | Status: AC
  Administered 2017-07-02: 100 mg via ORAL
  Filled 2017-07-02: qty 1

## 2017-07-02 MED ORDER — HYDROXYZINE PAMOATE 25 MG PO CAPS: 25.0000 mg | ORAL_CAPSULE | Freq: Four times a day (QID) | ORAL | 0 refills | Status: DC | PRN

## 2017-07-02 NOTE — ED Provider Notes (Signed)
[facility] DEPT Provider Note   CSN: [id number] Arrival date & time: 07/02/17  0850     History   Chief Complaint Chief Complaint  Patient presents with  . Rash    HPI Luis Wood is a 46 y.o. male.  Patient is a 46 year old male who presents to the emergency department with a complaint of insect bites, as well as an abscess in the groin.  The patient states that in the last  Weeks he has noted scattered bumps. He states they itch a lot, particularly at night. He has itching of his hands, his forearms, his ear as well as his thigh. He thinks that this may be related to mosquitoes flying insects, but he is not sure.  The patient also complains of a boil on the inguinal area. He states that it's been there a day or 2 but started actually aching on yesterday October 7. He thinks that he may have had some fever because he felt flushed and thought he had some chills. He did not measure a temperature. He has not had drainage from the area. He states however he has had previous abscess areas in this region.He would like to have this evaluated to prevent further problem or complication.      Past Medical History:  Diagnosis Date  . Asthma   . Hypertension     Patient Active Problem List   Diagnosis Date Noted  . SINUSITIS, ACUTE 07/11/2010  . BRONCHITIS, ACUTE 07/11/2010  . HYPERTENSION 04/25/2010  . ASTHMA 04/25/2010  . [place], RIGHT 04/25/2010    History reviewed. No pertinent surgical history.     Home Medications    Prior to Admission medications   Medication Sig Start Date End Date Taking? Authorizing Provider  albuterol (PROVENTIL HFA;VENTOLIN HFA) 108 (90 BASE) MCG/ACT inhaler Inhale 2 puffs into the lungs every 4 (four) hours as needed. For wheezing.    [provider]  bisoprolol-hydrochlorothiazide (ZIAC) 2.5-6.25 MG per tablet Take 1 tablet by mouth daily.    [provider]  cetirizine HCl (ZYRTEC) 5 MG/5ML SYRP Take 5 mLs (5  mg total) by mouth daily. 03/06/16   Elnora Morrison, MD  fexofenadine-pseudoephedrine (ALLEGRA-D) 60-120 MG 12 hr tablet Take 1 tablet by mouth 2 (two) times daily as needed. 08/31/16   Virgel Manifold, MD  meloxicam (MOBIC) 15 MG tablet Take 1 tablet (15 mg total) by mouth daily. For 7 days, then daily as needed for pain 10/26/16   Noe Gens, PA-C  Multiple Vitamins-Minerals (MEGA MULTI MEN PO) Take 1 tablet by mouth daily.    [provider]  predniSONE (DELTASONE) 20 MG tablet 3 tabs po day one, then 2 po daily x 4 days 10/26/16   Noe Gens, PA-C  traMADol (ULTRAM) 50 MG tablet Take 1 tablet (50 mg total) by mouth every 6 (six) hours as needed. 10/26/16   Noe Gens, PA-C    Family History Family History  Problem Relation Age of Onset  . Hypertension Father     Social History Social History  Substance Use Topics  . Smoking status: Never Smoker  . Smokeless tobacco: Never Used  . Alcohol use No     Allergies   Aspirin   Review of Systems Review of Systems  Constitutional: Negative for activity change.       All ROS Neg except as noted in HPI  HENT: Negative for nosebleeds.   Eyes: Negative for photophobia and discharge.  Respiratory: Negative for cough,  shortness of breath and wheezing.   Cardiovascular: Negative for chest pain and palpitations.  Gastrointestinal: Negative for abdominal pain and blood in stool.  Genitourinary: Negative for dysuria, frequency and hematuria.  Musculoskeletal: Negative for arthralgias, back pain and neck pain.  Skin: Positive for rash.       Inguinal abscess  Neurological: Negative for dizziness, seizures and speech difficulty.  Psychiatric/Behavioral: Negative for confusion and hallucinations.     Physical Exam Updated Vital Signs BP (!) 172/99 (BP Location: Left Arm)   Pulse 78   Temp 98.5 F (36.9 C) (Oral)   Resp 16   Ht 5\' 7"  (1.702 m)   Wt 103.4 kg (228 lb)   SpO2 98%   BMI 35.71 kg/m   Physical Exam    Constitutional: He is oriented to person, place, and time. He appears well-developed and well-nourished.  Non-toxic appearance.  HENT:  Head: Normocephalic.  Right Ear: Tympanic membrane and external ear normal.  Left Ear: Tympanic membrane and external ear normal.  Eyes: Pupils are equal, round, and reactive to light. EOM and lids are normal.  Neck: Normal range of motion. Neck supple. Carotid bruit is not present.  Cardiovascular: Normal rate, regular rhythm, normal heart sounds, intact distal pulses and normal pulses.   Pulmonary/Chest: Breath sounds normal. No respiratory distress.  Abdominal: Soft. Bowel sounds are normal. There is no tenderness. There is no guarding.  Genitourinary:  Genitourinary Comments: There is a small abscess area with a pointed head and the right inguinal area.There is no apparent swelling of the penis or the testicle area. The area is not hot to touch.  Musculoskeletal: Normal range of motion.  Lymphadenopathy:       Head (right side): No submandibular adenopathy present.       Head (left side): No submandibular adenopathy present.    He has no cervical adenopathy.  Neurological: He is alert and oriented to person, place, and time. He has normal strength. No cranial nerve deficit or sensory deficit.  Skin: Skin is warm and dry.  Patient has clusters of red slightly raised bumps on the dorsum of the right hand, on the inner aspect of the left thigh, on both forearms. There are couple of similar bumps on the outer aspect of the right ear. He also has a few in the skin fold areas of the neck.  Psychiatric: He has a normal mood and affect. His speech is normal.  Nursing note and vitals reviewed.    ED Treatments / Results  Labs (all labs ordered are listed, but only abnormal results are displayed) Labs Reviewed - No data to display  EKG  EKG Interpretation None       Radiology No results found.  Procedures Procedures (including critical care  time)  Medications Ordered in ED Medications - No data to display   Initial Impression / Assessment and Plan / ED Course  I have reviewed the triage vital signs and the nursing notes.  Pertinent labs & imaging results that were available during my care of the patient were reviewed by me and considered in my medical decision making (see chart for details).       Final Clinical Impressions(s) / ED Diagnoses MDM Blood pressure is elevated at 172/99, otherwise the vital signs within normal limits. The pulse oximetry is 98% on room air. Within normal limits by my interpretation.  The patient has red slightly raised bumps in clusters. This raises suspicion for mites, and or bedbugs. I've asked the patient  to spray for mites and bedbugs and to observe his linen for any changes or problems. I've asked him to vacuum any operative areas or cloth couches. Changes linen daily. The patient will be treated with Claritin during the day for itching, Vistaril every 6 hours as needed for more intense itching. The patient will use warm tub soaks and he will be treated with doxycycline for the boil area. The patient is to see his primary physician or return to the emergency department if any changes or problems.   Final diagnoses:  Abscess of right groin  Rash    New Prescriptions New Prescriptions   DOXYCYCLINE (VIBRAMYCIN) 100 MG CAPSULE    Take 1 capsule (100 mg total) by mouth 2 (two) times daily.   HYDROXYZINE (VISTARIL) 25 MG CAPSULE    Take 1 capsule (25 mg total) by mouth every 6 (six) hours as needed.     Lily Kocher, PA-C 07/02/17 1033    Julianne Rice, MD 07/02/17 [phone]

## 2017-07-02 NOTE — Discharge Instructions (Signed)
Please soak in a tub of warm Epsom salt water daily for about 15 minutes until the abscess area has resolved. Please use doxycycline 2 times daily. Please see Dr. Nevada Crane for additional evaluation if not improving.  The rash on your body could be from several things. As a precaution please spray your carpeted areas, your cloth couch areas, and your beds on. Please change linens daily. Please wash her clothing daily. Please use Claritin each morning for itching. May use Vistaril every 6 hours for more intense itching. Vistaril may cause drowsiness. Please do not drive a vehicle, drink alcohol, operate machinery, or participated in activities requiring concentration when taking this medication.

## 2017-07-02 NOTE — ED Triage Notes (Signed)
Pt reports scattered bump areas that have come up over the last 2 weeks. Pt has areas to arms and torso. Pt reports burning feeling. Also has boil to right inguinal area. States body ached yesterday

## 2017-07-02 NOTE — ED Notes (Addendum)
C/o insect bites to right arm, right middle finger and left thigh.  C.o right groin pain 8/10.  Took benadryl last night with no relief.  C/o total body irritation.

## 2018-02-25 ENCOUNTER — Emergency Department (HOSPITAL_COMMUNITY)
Admission: EM | Admit: 2018-02-25 | Discharge: 2018-02-25 | Disposition: A | Discharge status: Home / Self Care | Payer: BLUE CROSS/BLUE SHIELD | Attending: Emergency Medicine | Admitting: Emergency Medicine

## 2018-02-25 ENCOUNTER — Encounter (HOSPITAL_COMMUNITY): Payer: Self-pay | Admitting: Emergency Medicine

## 2018-02-25 DIAGNOSIS — R51 Headache: Secondary | ICD-10-CM | POA: Insufficient documentation

## 2018-02-25 DIAGNOSIS — Z79899 Other long term (current) drug therapy: Secondary | ICD-10-CM | POA: Insufficient documentation

## 2018-02-25 DIAGNOSIS — R519 Headache, unspecified: Secondary | ICD-10-CM

## 2018-02-25 DIAGNOSIS — I10 Essential (primary) hypertension: Secondary | ICD-10-CM | POA: Insufficient documentation

## 2018-02-25 DIAGNOSIS — J45909 Unspecified asthma, uncomplicated: Secondary | ICD-10-CM | POA: Insufficient documentation

## 2018-02-25 MED ORDER — KETOROLAC TROMETHAMINE 30 MG/ML IJ SOLN
15.0000 mg | Freq: Once | INTRAMUSCULAR | Status: AC
  Administered 2018-02-25: 15 mg via INTRAVENOUS
  Filled 2018-02-25: qty 1

## 2018-02-25 MED ORDER — SODIUM CHLORIDE 0.9 % IV BOLUS
1000.0000 mL | Freq: Once | INTRAVENOUS | Status: AC
  Administered 2018-02-25: 1000 mL via INTRAVENOUS

## 2018-02-25 MED ORDER — PROCHLORPERAZINE EDISYLATE 10 MG/2ML IJ SOLN
10.0000 mg | Freq: Once | INTRAMUSCULAR | Status: AC
  Administered 2018-02-25: 10 mg via INTRAVENOUS
  Filled 2018-02-25: qty 2

## 2018-02-25 NOTE — ED Notes (Signed)
ED Provider at bedside. 

## 2018-02-25 NOTE — ED Notes (Signed)
Patient able to ambulate to restroom without difficulty or assistance

## 2018-02-25 NOTE — ED Provider Notes (Signed)
[facility] EMERGENCY DEPARTMENT Provider Note   CSN: [id number] Arrival date & time: 02/25/18  0708     History   Chief Complaint Chief Complaint  Patient presents with  . Headache    HPI Luis Wood is a 47 y.o. male.  HPI Patient presents with concern of headache, nausea, vomiting, photophobia. Patient does have history of headache, though they typically are in a more sinus/frontal pattern. Now, over the past 2 or 3 days he has had pain in a circumferential distribution from the occiput bilaterally Pain is sore, severe, slightly improved with ibuprofen. There is associated photophobia, and he has had 2 or 3 episodes of vomiting, as well as nausea, without chest pain or abdominal pain. No weakness in any extremity, no vision loss. There is mild associated unsteadiness. Patient was well prior to the onset of symptoms. He does not smoke, drinks only occasionally, does have asthma, but has no recent exacerbation. No recent job, activity, medicine, diet changes.  Past Medical History:  Diagnosis Date  . Asthma   . Hypertension     Patient Active Problem List   Diagnosis Date Noted  . SINUSITIS, ACUTE 07/11/2010  . BRONCHITIS, ACUTE 07/11/2010  . HYPERTENSION 04/25/2010  . ASTHMA 04/25/2010  . [place], RIGHT 04/25/2010    History reviewed. No pertinent surgical history.      Home Medications    Prior to Admission medications   Medication Sig Start Date End Date Taking? Authorizing Provider  albuterol (PROVENTIL HFA;VENTOLIN HFA) 108 (90 BASE) MCG/ACT inhaler Inhale 2 puffs into the lungs every 4 (four) hours as needed. For wheezing.    [provider]  bisoprolol-hydrochlorothiazide (ZIAC) 2.5-6.25 MG per tablet Take 1 tablet by mouth daily.    [provider]  cetirizine HCl (ZYRTEC) 5 MG/5ML SYRP Take 5 mLs (5 mg total) by mouth daily. 03/06/16   Elnora Morrison, MD  doxycycline (VIBRAMYCIN) 100 MG capsule Take 1  capsule (100 mg total) by mouth 2 (two) times daily. 07/02/17   Lily Kocher, PA-C  fexofenadine-pseudoephedrine (ALLEGRA-D) 60-120 MG 12 hr tablet Take 1 tablet by mouth 2 (two) times daily as needed. 08/31/16   Virgel Manifold, MD  hydrOXYzine (VISTARIL) 25 MG capsule Take 1 capsule (25 mg total) by mouth every 6 (six) hours as needed. 07/02/17   Lily Kocher, PA-C  meloxicam (MOBIC) 15 MG tablet Take 1 tablet (15 mg total) by mouth daily. For 7 days, then daily as needed for pain 10/26/16   Noe Gens, PA-C  Multiple Vitamins-Minerals (MEGA MULTI MEN PO) Take 1 tablet by mouth daily.    [provider]  predniSONE (DELTASONE) 20 MG tablet 3 tabs po day one, then 2 po daily x 4 days 10/26/16   Noe Gens, PA-C  traMADol (ULTRAM) 50 MG tablet Take 1 tablet (50 mg total) by mouth every 6 (six) hours as needed. 10/26/16   Noe Gens, PA-C    Family History Family History  Problem Relation Age of Onset  . Hypertension Father     Social History Social History   Tobacco Use  . Smoking status: Never Smoker  . Smokeless tobacco: Never Used  Substance Use Topics  . Alcohol use: No  . Drug use: No     Allergies   Aspirin   Review of Systems Review of Systems  Constitutional:       Per HPI, otherwise negative  HENT:       Per HPI, otherwise negative  Respiratory:       Per HPI, otherwise negative  Cardiovascular:       Per HPI, otherwise negative  Gastrointestinal: Positive for nausea and vomiting.  Endocrine:       Negative aside from HPI  Genitourinary:       Neg aside from HPI   Musculoskeletal:       Per HPI, otherwise negative  Skin: Negative.   Neurological: Positive for headaches. Negative for syncope.     Physical Exam Updated Vital Signs BP (!) 164/84   Pulse 91   Temp 98.6 F (37 C) (Oral)   Resp 19   SpO2 98%   Physical Exam  Constitutional: He is oriented to person, place, and time. He appears well-developed. No distress.  HENT:    Head: Normocephalic and atraumatic.  Eyes: Pupils are equal, round, and reactive to light. Conjunctivae and EOM are normal. Right eye exhibits normal extraocular motion and no nystagmus. Left eye exhibits normal extraocular motion and no nystagmus.  Discomfort with right lateral gaze, but no abnormal vision  Cardiovascular: Normal rate and regular rhythm.  Pulmonary/Chest: Effort normal. No stridor. No respiratory distress.  Abdominal: He exhibits no distension.  Musculoskeletal: He exhibits no edema.  Neurological: He is alert and oriented to person, place, and time. He has normal strength. He is not disoriented. No cranial nerve deficit. Coordination and gait normal.  Skin: Skin is warm and dry.  Psychiatric: He has a normal mood and affect.  Nursing note and vitals reviewed.    ED Treatments / Results   Procedures Procedures (including critical care time)  Medications Ordered in ED Medications  sodium chloride 0.9 % bolus 1,000 mL (0 mLs Intravenous Stopped 02/25/18 1028)  ketorolac (TORADOL) 30 MG/ML injection 15 mg (15 mg Intravenous Given 02/25/18 0901)  prochlorperazine (COMPAZINE) injection 10 mg (10 mg Intravenous Given 02/25/18 0901)     Initial Impression / Assessment and Plan / ED Course  I have reviewed the triage vital signs and the nursing notes.  Pertinent labs & imaging results that were available during my care of the patient were reviewed by me and considered in my medical decision making (see chart for details).    Chart reviewed after the initial evaluation, most notable for 3 had CT scans in the past 10 years. Patient had ED evaluation for headache 3 years ago, also described as atypical, with reassuring evaluation. 10:34 AM On repeat exam patient is awake, alert, ambulatory, states that he feels substantially better. Given his resolution of complaints, low suspicion for intracranial hemorrhage, mass.  Some suspicion for a typical headache for him, but with  reassuring findings, return to normal functionality, the patient was discharged in stable condition with outpatient follow-up.   Final Clinical Impressions(s) / ED Diagnoses  Atypical headache   Carmin Muskrat, MD 02/25/18 1035

## 2018-02-25 NOTE — ED Triage Notes (Signed)
Patent complains of headache with photosensitivity since Friday as well as fever that started this morning. Took regular strength aleve this AM with some relief. No neuro deficits, alert and oriented and in no apparent distress at this time.

## 2018-02-25 NOTE — Discharge Instructions (Addendum)
As discussed, your evaluation today has been largely reassuring.  But, it is important that you monitor your condition carefully, and do not hesitate to return to the ED if you develop new, or concerning changes in your condition. ? ?Otherwise, please follow-up with your physician for appropriate ongoing care. ? ?

## 2018-12-17 ENCOUNTER — Encounter (HOSPITAL_COMMUNITY): Payer: Self-pay

## 2018-12-17 ENCOUNTER — Other Ambulatory Visit: Payer: Self-pay

## 2018-12-17 ENCOUNTER — Emergency Department (HOSPITAL_COMMUNITY)
Admission: EM | Admit: 2018-12-17 | Discharge: 2018-12-17 | Disposition: A | Discharge status: Home / Self Care | Payer: Self-pay | Attending: Emergency Medicine | Admitting: Emergency Medicine

## 2018-12-17 DIAGNOSIS — Z79899 Other long term (current) drug therapy: Secondary | ICD-10-CM | POA: Insufficient documentation

## 2018-12-17 DIAGNOSIS — J45909 Unspecified asthma, uncomplicated: Secondary | ICD-10-CM | POA: Insufficient documentation

## 2018-12-17 DIAGNOSIS — I10 Essential (primary) hypertension: Secondary | ICD-10-CM | POA: Insufficient documentation

## 2018-12-17 DIAGNOSIS — N4833 Priapism, drug-induced: Secondary | ICD-10-CM | POA: Insufficient documentation

## 2018-12-17 MED ORDER — LIDOCAINE HCL (PF) 1 % IJ SOLN
5.0000 mL | Freq: Once | INTRAMUSCULAR | Status: AC
  Administered 2018-12-17: 5 mL
  Filled 2018-12-17: qty 30

## 2018-12-17 MED ORDER — PHENYLEPHRINE 200 MCG/ML FOR PRIAPISM / HYPOTENSION
200.0000 ug | Freq: Once | INTRAMUSCULAR | Status: AC
  Administered 2018-12-17: 200 ug via INTRACAVERNOUS
  Filled 2018-12-17: qty 50

## 2018-12-17 NOTE — ED Provider Notes (Signed)
[facility] DEPT Provider Note   CSN: [id number] Arrival date & time: 12/17/18  1622    History   Chief Complaint Chief Complaint  Patient presents with  . Priapism    HPI Luis Wood is a 48 y.o. male.     HPI   47yM with sustained painful erection since 930 pm yesterday. This happened after he injected trimix that his friend gave him. No new meds otherwise. No hx of sickle cell. No prior history of priapism. Tried drinking vinegar and icing it w/o improvement.   Past Medical History:  Diagnosis Date  . Asthma   . Hypertension     Patient Active Problem List   Diagnosis Date Noted  . SINUSITIS, ACUTE 07/11/2010  . BRONCHITIS, ACUTE 07/11/2010  . HYPERTENSION 04/25/2010  . ASTHMA 04/25/2010  . [place], RIGHT 04/25/2010    No past surgical history on file.      Home Medications    Prior to Admission medications   Medication Sig Start Date End Date Taking? Authorizing Provider  albuterol (PROVENTIL HFA;VENTOLIN HFA) 108 (90 BASE) MCG/ACT inhaler Inhale 2 puffs into the lungs every 4 (four) hours as needed. For wheezing.    [provider]  bisoprolol-hydrochlorothiazide (ZIAC) 2.5-6.25 MG per tablet Take 1 tablet by mouth daily.    [provider]  cetirizine HCl (ZYRTEC) 5 MG/5ML SYRP Take 5 mLs (5 mg total) by mouth daily. 03/06/16   Elnora Morrison, MD  doxycycline (VIBRAMYCIN) 100 MG capsule Take 1 capsule (100 mg total) by mouth 2 (two) times daily. 07/02/17   Lily Kocher, PA-C  fexofenadine-pseudoephedrine (ALLEGRA-D) 60-120 MG 12 hr tablet Take 1 tablet by mouth 2 (two) times daily as needed. 08/31/16   Virgel Manifold, MD  hydrOXYzine (VISTARIL) 25 MG capsule Take 1 capsule (25 mg total) by mouth every 6 (six) hours as needed. 07/02/17   Lily Kocher, PA-C  meloxicam (MOBIC) 15 MG tablet Take 1 tablet (15 mg total) by mouth daily. For 7 days, then daily as needed for pain 10/26/16   Noe Gens,  PA-C  Multiple Vitamins-Minerals (MEGA MULTI MEN PO) Take 1 tablet by mouth daily.    [provider]  predniSONE (DELTASONE) 20 MG tablet 3 tabs po day one, then 2 po daily x 4 days 10/26/16   Noe Gens, PA-C  traMADol (ULTRAM) 50 MG tablet Take 1 tablet (50 mg total) by mouth every 6 (six) hours as needed. 10/26/16   Noe Gens, PA-C    Family History Family History  Problem Relation Age of Onset  . Hypertension Father     Social History Social History   Tobacco Use  . Smoking status: Never Smoker  . Smokeless tobacco: Never Used  Substance Use Topics  . Alcohol use: No  . Drug use: No     Allergies   Aspirin   Review of Systems Review of Systems  All systems reviewed and negative, other than as noted in HPI.  Physical Exam Updated Vital Signs BP (!) 234/117 (BP Location: Left Arm)   Pulse (!) 118   Temp (!) 97.5 F (36.4 C) (Oral)   Resp 14   SpO2 100%   Physical Exam Vitals signs and nursing note reviewed.  Constitutional:      General: He is not in acute distress.    Appearance: He is well-developed.  HENT:     Head: Normocephalic and atraumatic.  Eyes:     General:  Right eye: No discharge.        Left eye: No discharge.     Conjunctiva/sclera: Conjunctivae normal.  Neck:     Musculoskeletal: Neck supple.  Cardiovascular:     Rate and Rhythm: Normal rate and regular rhythm.     Heart sounds: Normal heart sounds. No murmur. No friction rub. No gallop.   Pulmonary:     Effort: Pulmonary effort is normal. No respiratory distress.     Breath sounds: Normal breath sounds.  Abdominal:     General: There is no distension.     Palpations: Abdomen is soft.     Tenderness: There is no abdominal tenderness.  Genitourinary:    Comments: Chaperone present. Erect penis. Exam otherwise unremarkable.  Musculoskeletal:        General: No tenderness.  Skin:    General: Skin is warm and dry.  Neurological:     Mental Status: He is alert.   Psychiatric:        Behavior: Behavior normal.        Thought Content: Thought content normal.      ED Treatments / Results  Labs (all labs ordered are listed, but only abnormal results are displayed) Labs Reviewed - No data to display  EKG None  Radiology No results found.  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  Pertinent labs & imaging results that were available during my care of the patient were reviewed by me and considered in my medical decision making (see chart for details).    47yM with priapism since 930 pm yesterday after injecting trimix. Pt counseled on the dangers of using medications not prescribed to him and potential complications including fibrosis, impotence, etc.   6:21 PM Detumescence.   Again cautioned about using other people's meds and priapism in general. Urology FU if has further issues.    Final Clinical Impressions(s) / ED Diagnoses   Final diagnoses:  Drug-induced priapism    ED Discharge Orders    None       Virgel Manifold, MD 12/17/18 1821

## 2018-12-17 NOTE — ED Notes (Signed)
Pt changing

## 2018-12-17 NOTE — ED Triage Notes (Addendum)
Pt reports that he used something called trimix yesterday and has had an erection since 930p. He states he only injected 2cc into his penis. Hx of HTN and trouble getting his BP medication.

## 2018-12-17 NOTE — ED Notes (Signed)
Pt reports trying his friend's Trimix injection (used to treat ED) last night around 2130.  His penis have been erect since.  He reports mild discomfort.  No other complaints.

## 2019-05-10 ENCOUNTER — Emergency Department (HOSPITAL_COMMUNITY)
Admission: EM | Admit: 2019-05-10 | Discharge: 2019-05-10 | Disposition: A | Discharge status: Home / Self Care | Payer: Self-pay | Attending: Emergency Medicine | Admitting: Emergency Medicine

## 2019-05-10 ENCOUNTER — Other Ambulatory Visit: Payer: Self-pay

## 2019-05-10 ENCOUNTER — Encounter (HOSPITAL_COMMUNITY): Payer: Self-pay

## 2019-05-10 DIAGNOSIS — I10 Essential (primary) hypertension: Secondary | ICD-10-CM | POA: Insufficient documentation

## 2019-05-10 DIAGNOSIS — N4830 Priapism, unspecified: Secondary | ICD-10-CM | POA: Insufficient documentation

## 2019-05-10 DIAGNOSIS — J45909 Unspecified asthma, uncomplicated: Secondary | ICD-10-CM | POA: Insufficient documentation

## 2019-05-10 DIAGNOSIS — Z79899 Other long term (current) drug therapy: Secondary | ICD-10-CM | POA: Insufficient documentation

## 2019-05-10 MED ORDER — IBUPROFEN 800 MG PO TABS
800.0000 mg | ORAL_TABLET | Freq: Once | ORAL | Status: AC
  Administered 2019-05-10: 10:00:00 800 mg via ORAL
  Filled 2019-05-10: qty 1

## 2019-05-10 MED ORDER — PHENYLEPHRINE 200 MCG/ML FOR PRIAPISM / HYPOTENSION
200.0000 ug | Freq: Once | INTRAMUSCULAR | Status: AC
  Administered 2019-05-10: 200 ug via INTRACAVERNOUS
  Filled 2019-05-10: qty 50

## 2019-05-10 NOTE — Discharge Instructions (Addendum)
Do not use trimix!

## 2019-05-10 NOTE — ED Provider Notes (Signed)
[facility] DEPT Provider Note   CSN: [id number] Arrival date & time: 05/10/19  [id number]    History   Chief Complaint Chief Complaint  Patient presents with  . Persistent Erection    HPI Luis Wood is a 48 y.o. male.     Pt presents to the ED today with priapism since around 1900.  The pt gave himself an injection of trimix.  The pt said this has happened to him in the past with the same medication.     Past Medical History:  Diagnosis Date  . Asthma   . Hypertension     Patient Active Problem List   Diagnosis Date Noted  . SINUSITIS, ACUTE 07/11/2010  . BRONCHITIS, ACUTE 07/11/2010  . HYPERTENSION 04/25/2010  . ASTHMA 04/25/2010  . [place], RIGHT 04/25/2010    History reviewed. No pertinent surgical history.      Home Medications    Prior to Admission medications   Medication Sig Start Date End Date Taking? Authorizing Provider  albuterol (PROVENTIL HFA;VENTOLIN HFA) 108 (90 BASE) MCG/ACT inhaler Inhale 2 puffs into the lungs every 4 (four) hours as needed. For wheezing.   Yes [provider]  bisoprolol-hydrochlorothiazide (ZIAC) 2.5-6.25 MG per tablet Take 1 tablet by mouth daily.   Yes [provider]  ibuprofen (ADVIL) 200 MG tablet Take 200 mg by mouth every 6 (six) hours as needed for mild pain.   Yes [provider]  cetirizine HCl (ZYRTEC) 5 MG/5ML SYRP Take 5 mLs (5 mg total) by mouth daily. Patient not taking: Reported on 12/17/2018 03/06/16   Elnora Morrison, MD  doxycycline (VIBRAMYCIN) 100 MG capsule Take 1 capsule (100 mg total) by mouth 2 (two) times daily. Patient not taking: Reported on 12/17/2018 07/02/17   Lily Kocher, PA-C  fexofenadine-pseudoephedrine (ALLEGRA-D) 60-120 MG 12 hr tablet Take 1 tablet by mouth 2 (two) times daily as needed. Patient not taking: Reported on 12/17/2018 08/31/16   Virgel Manifold, MD  hydrOXYzine (VISTARIL) 25 MG capsule Take 1 capsule (25 mg total)  by mouth every 6 (six) hours as needed. Patient not taking: Reported on 12/17/2018 07/02/17   Lily Kocher, PA-C  meloxicam (MOBIC) 15 MG tablet Take 1 tablet (15 mg total) by mouth daily. For 7 days, then daily as needed for pain Patient not taking: Reported on 12/17/2018 10/26/16   Noe Gens, PA-C  predniSONE (DELTASONE) 20 MG tablet 3 tabs po day one, then 2 po daily x 4 days Patient not taking: Reported on 12/17/2018 10/26/16   Noe Gens, PA-C  traMADol (ULTRAM) 50 MG tablet Take 1 tablet (50 mg total) by mouth every 6 (six) hours as needed. Patient not taking: Reported on 12/17/2018 10/26/16   Tyrell Antonio    Family History Family History  Problem Relation Age of Onset  . Hypertension Father     Social History Social History   Tobacco Use  . Smoking status: Never Smoker  . Smokeless tobacco: Never Used  Substance Use Topics  . Alcohol use: No  . Drug use: No     Allergies   Aspirin   Review of Systems Review of Systems  Genitourinary: Positive for penile pain.  All other systems reviewed and are negative.    Physical Exam Updated Vital Signs BP (!) 191/105   Pulse 90   Temp 98.3 F (36.8 C) (Oral)   Resp 15   SpO2 100%   Physical Exam Vitals signs and nursing note reviewed.  Exam conducted with a chaperone present.  Constitutional:      Appearance: Normal appearance.  HENT:     Head: Normocephalic and atraumatic.     Right Ear: External ear normal.     Left Ear: External ear normal.     Nose: Nose normal.     Mouth/Throat:     Mouth: Mucous membranes are moist.  Eyes:     Extraocular Movements: Extraocular movements intact.     Conjunctiva/sclera: Conjunctivae normal.     Pupils: Pupils are equal, round, and reactive to light.  Neck:     Musculoskeletal: Normal range of motion and neck supple.  Cardiovascular:     Rate and Rhythm: Normal rate and regular rhythm.     Pulses: Normal pulses.     Heart sounds: Normal heart sounds.   Pulmonary:     Effort: Pulmonary effort is normal.     Breath sounds: Normal breath sounds.  Abdominal:     General: Abdomen is flat. Bowel sounds are normal.     Palpations: Abdomen is soft.  Genitourinary:    Comments: Priapism noted Musculoskeletal: Normal range of motion.  Skin:    General: Skin is warm.     Capillary Refill: Capillary refill takes less than 2 seconds.  Neurological:     General: No focal deficit present.     Mental Status: He is alert and oriented to person, place, and time.  Psychiatric:        Mood and Affect: Mood normal.        Behavior: Behavior normal.      ED Treatments / Results  Labs (all labs ordered are listed, but only abnormal results are displayed) Labs Reviewed - No data to display  EKG None  Radiology No results found.  Procedures Irrigate corpus cavern, priapism  Date/Time: 05/10/2019 8:04 AM Performed by: Isla Pence, MD Authorized by: Isla Pence, MD  Consent: Verbal consent obtained. Risks and benefits: risks, benefits and alternatives were discussed Consent given by: patient Patient understanding: patient states understanding of the procedure being performed Patient identity confirmed: verbally with patient Time out: Immediately prior to procedure a "time out" was called to verify the correct patient, procedure, equipment, support staff and site/side marked as required. Preparation: Patient was prepped and draped in the usual sterile fashion. Local anesthesia used: no  Anesthesia: Local anesthesia used: no  Sedation: Patient sedated: no  Patient tolerance: patient tolerated the procedure well with no immediate complications Comments: Pt required multiple injections.  1st was on the right side with 1.5 cc of 200 mcg/ml.  Next was left side with 1.5 cc.  3rd was on the right, first removed 5 cc of dark, clotted blood and then injected with 1.5 cc.  4th was left side with 8 cc of blood and then 1.5 cc.  Last was  right side with 4 cc of normal looking blood then 1.5 cc.  Pt finally achieved detumescence.    (including critical care time)  Medications Ordered in ED Medications  phenylephrine 200 mcg / ml CONC. DILUTION INJ (ED / Urology USE ONLY) (200 mcg Intracavernosal Given 05/10/19 0730)  ibuprofen (ADVIL) tablet 800 mg (800 mg Oral Given 05/10/19 0943)     Initial Impression / Assessment and Plan / ED Course  I have reviewed the triage vital signs and the nursing notes.  Pertinent labs & imaging results that were available during my care of the patient were reviewed by me and considered in my medical decision  making (see chart for details).   Final Clinical Impressions(s) / ED Diagnoses   Final diagnoses:  Priapism    ED Discharge Orders    None       Isla Pence, MD 05/10/19 1100

## 2019-05-10 NOTE — ED Triage Notes (Signed)
Pt reports a erection since 730p last night. He states that he used an injection called "trimix" with his girlfriend. He states that this has happened once before with this medication and that he rarely uses it. Denies chest pain.

## 2021-06-22 ENCOUNTER — Ambulatory Visit (HOSPITAL_COMMUNITY)
Admission: EM | Admit: 2021-06-22 | Discharge: 2021-06-22 | Disposition: A | Discharge status: Home / Self Care | Payer: Self-pay | Attending: Family Medicine | Admitting: Family Medicine

## 2021-06-22 ENCOUNTER — Encounter (HOSPITAL_COMMUNITY): Payer: Self-pay

## 2021-06-22 ENCOUNTER — Other Ambulatory Visit: Payer: Self-pay

## 2021-06-22 DIAGNOSIS — Z886 Allergy status to analgesic agent status: Secondary | ICD-10-CM | POA: Insufficient documentation

## 2021-06-22 DIAGNOSIS — J45909 Unspecified asthma, uncomplicated: Secondary | ICD-10-CM | POA: Insufficient documentation

## 2021-06-22 DIAGNOSIS — U071 COVID-19: Secondary | ICD-10-CM | POA: Insufficient documentation

## 2021-06-22 DIAGNOSIS — J069 Acute upper respiratory infection, unspecified: Secondary | ICD-10-CM

## 2021-06-22 DIAGNOSIS — Z8249 Family history of ischemic heart disease and other diseases of the circulatory system: Secondary | ICD-10-CM | POA: Insufficient documentation

## 2021-06-22 DIAGNOSIS — R030 Elevated blood-pressure reading, without diagnosis of hypertension: Secondary | ICD-10-CM | POA: Insufficient documentation

## 2021-06-22 DIAGNOSIS — Z79899 Other long term (current) drug therapy: Secondary | ICD-10-CM | POA: Insufficient documentation

## 2021-06-22 LAB — POC INFLUENZA A AND B ANTIGEN (URGENT CARE ONLY)
INFLUENZA A ANTIGEN, POC: NEGATIVE
INFLUENZA A ANTIGEN, POC: NEGATIVE
INFLUENZA B ANTIGEN, POC: NEGATIVE
INFLUENZA B ANTIGEN, POC: NEGATIVE

## 2021-06-22 MED ORDER — PROMETHAZINE-DM 6.25-15 MG/5ML PO SYRP: 5.0000 mL | ORAL_SOLUTION | Freq: Four times a day (QID) | ORAL | 0 refills | Status: DC | PRN

## 2021-06-22 MED ORDER — ALBUTEROL SULFATE HFA 108 (90 BASE) MCG/ACT IN AERS
INHALATION_SPRAY | RESPIRATORY_TRACT | Status: AC
  Filled 2021-06-22: qty 6.7

## 2021-06-22 MED ORDER — AMLODIPINE BESYLATE 5 MG PO TABS: 5.0000 mg | ORAL_TABLET | Freq: Every day | ORAL | 2 refills | Status: DC

## 2021-06-22 MED ORDER — ALBUTEROL SULFATE HFA 108 (90 BASE) MCG/ACT IN AERS
2.0000 | INHALATION_SPRAY | Freq: Once | RESPIRATORY_TRACT | Status: AC
  Administered 2021-06-22: 2 via RESPIRATORY_TRACT

## 2021-06-22 NOTE — ED Triage Notes (Signed)
Pt c/o cough, sore throat, bilateral ear pressure, face pressure, headaches, body aches, and chills since Saturday. C/o severe fatigue and had a fever on Sunday morning.

## 2021-06-23 LAB — SARS CORONAVIRUS 2 (TAT 6-24 HRS): SARS Coronavirus 2: POSITIVE — AB

## 2021-06-26 NOTE — ED Provider Notes (Signed)
Luis Wood    CSN: [id number] Arrival date & time: 06/22/21  1157      History   Chief Complaint Chief Complaint  Patient presents with   Cough    HPI Luis Wood is a 50 y.o. male.   Presenting today with several day history of cough, sore throat, b/l ear pressure, headaches, body aches, chills, sweats, sinus pressure, fatigue, fevers off and on. Denies CP, SOB, dizziness, abdominal pain, N/V/D. So far taking OTC cold medication with no relief. No known sick contacts recently. Hx of asthma on albuterol prn.    Past Medical History:  Diagnosis Date   Asthma    Hypertension     Patient Active Problem List   Diagnosis Date Noted   SINUSITIS, ACUTE 07/11/2010   BRONCHITIS, ACUTE 07/11/2010   HYPERTENSION 04/25/2010   ASTHMA 04/25/2010   GROIN STRAIN, RIGHT 04/25/2010    History reviewed. No pertinent surgical history.     Home Medications    Prior to Admission medications   Medication Sig Start Date End Date Taking? Authorizing Provider  amLODipine (NORVASC) 5 MG tablet Take 1 tablet (5 mg total) by mouth daily. 06/22/21  Yes Volney American, PA-C  promethazine-dextromethorphan (PROMETHAZINE-DM) 6.25-15 MG/5ML syrup Take 5 mLs by mouth 4 (four) times daily as needed for cough. 06/22/21  Yes Volney American, PA-C  albuterol (PROVENTIL HFA;VENTOLIN HFA) 108 (90 BASE) MCG/ACT inhaler Inhale 2 puffs into the lungs every 4 (four) hours as needed. For wheezing.    [provider]  bisoprolol-hydrochlorothiazide (ZIAC) 2.5-6.25 MG per tablet Take 1 tablet by mouth daily.    [provider]  cetirizine HCl (ZYRTEC) 5 MG/5ML SYRP Take 5 mLs (5 mg total) by mouth daily. Patient not taking: Reported on 12/17/2018 03/06/16   Elnora Morrison, MD  doxycycline (VIBRAMYCIN) 100 MG capsule Take 1 capsule (100 mg total) by mouth 2 (two) times daily. Patient not taking: Reported on 12/17/2018 07/02/17   Lily Kocher, PA-C   fexofenadine-pseudoephedrine (ALLEGRA-D) 60-120 MG 12 hr tablet Take 1 tablet by mouth 2 (two) times daily as needed. Patient not taking: Reported on 12/17/2018 08/31/16   Virgel Manifold, MD  hydrOXYzine (VISTARIL) 25 MG capsule Take 1 capsule (25 mg total) by mouth every 6 (six) hours as needed. Patient not taking: Reported on 12/17/2018 07/02/17   Lily Kocher, PA-C  ibuprofen (ADVIL) 200 MG tablet Take 200 mg by mouth every 6 (six) hours as needed for mild pain.    [provider]  meloxicam (MOBIC) 15 MG tablet Take 1 tablet (15 mg total) by mouth daily. For 7 days, then daily as needed for pain Patient not taking: Reported on 12/17/2018 10/26/16   Noe Gens, PA-C  predniSONE (DELTASONE) 20 MG tablet 3 tabs po day one, then 2 po daily x 4 days Patient not taking: Reported on 12/17/2018 10/26/16   Noe Gens, PA-C  traMADol (ULTRAM) 50 MG tablet Take 1 tablet (50 mg total) by mouth every 6 (six) hours as needed. Patient not taking: Reported on 12/17/2018 10/26/16   Tyrell Antonio    Family History Family History  Problem Relation Age of Onset   Hypertension Father     Social History Social History   Tobacco Use   Smoking status: Never   Smokeless tobacco: Never  Substance Use Topics   Alcohol use: No   Drug use: No     Allergies   Aspirin   Review of Systems Review of  Systems PER HPI   Physical Exam Triage Vital Signs ED Triage Vitals  Enc Vitals Group     BP 06/22/21 1315 (!) 228/131     Pulse Rate 06/22/21 1315 (!) 103     Resp 06/22/21 1315 18     Temp 06/22/21 1315 98.6 F (37 C)     Temp Source 06/22/21 1315 Oral     SpO2 06/22/21 1315 96 %     Weight --      Height --      Head Circumference --      Peak Flow --      Pain Score 06/22/21 1316 7     Pain Loc --      Pain Edu? --      Excl. in [place]? --    No data found.  Updated Vital Signs BP (!) 228/131 (BP Location: Left Arm)   Pulse (!) 103   Temp 98.6 F (37 C) (Oral)   Resp  18   SpO2 96%   Visual Acuity Right Eye Distance:   Left Eye Distance:   Bilateral Distance:    Right Eye Near:   Left Eye Near:    Bilateral Near:     Physical Exam Vitals and nursing note reviewed.  Constitutional:      Appearance: Normal appearance. He is not ill-appearing.  HENT:     Head: Atraumatic.     Right Ear: Tympanic membrane normal.     Left Ear: Tympanic membrane normal.     Nose: Congestion present.     Mouth/Throat:     Mouth: Mucous membranes are moist.     Pharynx: Posterior oropharyngeal erythema present.  Eyes:     Extraocular Movements: Extraocular movements intact.     Conjunctiva/sclera: Conjunctivae normal.  Cardiovascular:     Rate and Rhythm: Normal rate and regular rhythm.     Heart sounds: Normal heart sounds.  Pulmonary:     Effort: Pulmonary effort is normal.     Breath sounds: Normal breath sounds. No wheezing or rales.  Abdominal:     General: Bowel sounds are normal. There is no distension.     Palpations: Abdomen is soft.     Tenderness: There is no abdominal tenderness. There is no guarding.  Musculoskeletal:        General: Normal range of motion.     Cervical back: Normal range of motion and neck supple.  Skin:    General: Skin is warm and dry.  Neurological:     Mental Status: He is alert.     Motor: No weakness.     Gait: Gait normal.  Psychiatric:        Mood and Affect: Mood normal.        Thought Content: Thought content normal.        Judgment: Judgment normal.     UC Treatments / Results  Labs (all labs ordered are listed, but only abnormal results are displayed) Labs Reviewed  SARS CORONAVIRUS 2 (TAT 6-24 HRS) - Abnormal; Notable for the following components:      Result Value   SARS Coronavirus 2 POSITIVE (*)    All other components within normal limits  POC INFLUENZA A AND B ANTIGEN (URGENT CARE ONLY)  POC INFLUENZA A AND B ANTIGEN (URGENT CARE ONLY)    EKG   Radiology No results  found.  Procedures Procedures (including critical care time)  Medications Ordered in UC Medications  albuterol (VENTOLIN HFA) 108 (90 Base)  MCG/ACT inhaler 2 puff (2 puffs Inhalation Given 06/22/21 1423)    Initial Impression / Assessment and Plan / UC Course  I have reviewed the triage vital signs and the nursing notes.  Pertinent labs & imaging results that were available during my care of the patient were reviewed by me and considered in my medical decision making (see chart for details).     Significantly elevated BP today, likely chronic, will start amlodipine and await est care for close PCP f/u. Discussed risks and side effects, DASH diet, exercise for further benefit.  Strongly suspect COVID, COVID pcr pending, phenergan DM and albuterol in addition to OTC supportive medications and home care. Work note given with quarantine protocol. Return for acutely worsening sxs.   Final Clinical Impressions(s) / UC Diagnoses   Final diagnoses:  Viral URI with cough  Elevated blood pressure reading   Discharge Instructions   None    ED Prescriptions     Medication Sig Dispense Auth. Provider   promethazine-dextromethorphan (PROMETHAZINE-DM) 6.25-15 MG/5ML syrup Take 5 mLs by mouth 4 (four) times daily as needed for cough. 100 mL Volney American, PA-C   amLODipine (NORVASC) 5 MG tablet Take 1 tablet (5 mg total) by mouth daily. 30 tablet Volney American, [place]      PDMP not reviewed this encounter.   Volney American, [place] 06/26/21 2146

## 2021-10-10 ENCOUNTER — Ambulatory Visit: Payer: BC Managed Care – PPO | Admitting: Registered Nurse

## 2021-10-10 ENCOUNTER — Encounter: Payer: Self-pay | Admitting: Registered Nurse

## 2021-10-10 VITALS — BP 146/80 | HR 100 | Temp 98.3°F | Resp 16 | Ht 67.0 in | Wt 236.0 lb

## 2021-10-10 DIAGNOSIS — F411 Generalized anxiety disorder: Secondary | ICD-10-CM | POA: Diagnosis not present

## 2021-10-10 DIAGNOSIS — I10 Essential (primary) hypertension: Secondary | ICD-10-CM | POA: Diagnosis not present

## 2021-10-10 DIAGNOSIS — Z13220 Encounter for screening for lipoid disorders: Secondary | ICD-10-CM | POA: Diagnosis not present

## 2021-10-10 DIAGNOSIS — R6882 Decreased libido: Secondary | ICD-10-CM

## 2021-10-10 DIAGNOSIS — Z1329 Encounter for screening for other suspected endocrine disorder: Secondary | ICD-10-CM | POA: Diagnosis not present

## 2021-10-10 DIAGNOSIS — Z8639 Personal history of other endocrine, nutritional and metabolic disease: Secondary | ICD-10-CM

## 2021-10-10 DIAGNOSIS — Z13228 Encounter for screening for other metabolic disorders: Secondary | ICD-10-CM

## 2021-10-10 DIAGNOSIS — Z130 Encounter for screening for diseases of the blood and blood-forming organs and certain disorders involving the immune mechanism: Secondary | ICD-10-CM

## 2021-10-10 MED ORDER — AMLODIPINE BESYLATE 5 MG PO TABS: 5.0000 mg | ORAL_TABLET | Freq: Every day | ORAL | 0 refills | Status: DC

## 2021-10-10 MED ORDER — HYDROXYZINE HCL 10 MG PO TABS: 10.0000 mg | ORAL_TABLET | Freq: Three times a day (TID) | ORAL | 0 refills | Status: DC | PRN

## 2021-10-10 MED ORDER — FLUOXETINE HCL 20 MG PO CAPS: 20.0000 mg | ORAL_CAPSULE | Freq: Every day | ORAL | 3 refills | Status: DC

## 2021-10-10 MED ORDER — TRAZODONE HCL 50 MG PO TABS: 25.0000 mg | ORAL_TABLET | Freq: Every evening | ORAL | 3 refills | Status: DC | PRN

## 2021-10-10 NOTE — Progress Notes (Signed)
New Patient Office Visit  Subjective:  Patient ID: Luis Wood, male    DOB: 1970-11-12  Age: 51 y.o. MRN: [id number]  CC:  Chief Complaint  Patient presents with   Establish Care    HPI Luis Wood presents for visit to est care.  Hypertension: Patient Currently taking: amlodipine 5mg  po qd - has run out x 1 mo. Good effect. No AEs. Denies CV symptoms including: chest pain, shob, doe, headache, visual changes, fatigue, claudication, and dependent edema.   Previous readings and labs: BP Readings from Last 3 Encounters:  10/10/21 (!) 146/80  06/22/21 (!) 228/131  05/10/19 (!) 191/105   Lab Results  Component Value Date   CREATININE 0.89 03/22/2013    Hx of elevated glucose Last available check was on 03/22/13 with glucose of 340.  Pt has never rec'd dx of t2dm. Would like to recheck.  Anxiety Ongoing. Worse since COVID Feels panicky a number of times each day Sleep disturbance most nights Has not tried medication but is willing Declines counseling at this time.   Past Medical History:  Diagnosis Date   Asthma    Hypertension     No past surgical history on file.  Family History  Problem Relation Age of Onset   Hypertension Father     Social History   Socioeconomic History   Marital status: Significant Other    Spouse name: Not on file   Number of children: Not on file   Years of education: Not on file   Highest education level: Not on file  Occupational History   Not on file  Tobacco Use   Smoking status: Never   Smokeless tobacco: Never  Substance and Sexual Activity   Alcohol use: No   Drug use: No   Sexual activity: Not on file  Other Topics Concern   Not on file  Social History Narrative   Not on file   Social Determinants of Health   Financial Resource Strain: Not on file  Food Insecurity: Not on file  Transportation Needs: Not on file  Physical Activity: Not on file  Stress: Not on file  Social Connections: Not on  file  Intimate Partner Violence: Not on file    ROS Review of Systems  Constitutional:  Positive for fatigue.  HENT: Negative.    Eyes: Negative.   Respiratory: Negative.    Cardiovascular: Negative.   Gastrointestinal: Negative.   Endocrine: Negative.   Genitourinary: Negative.   Musculoskeletal: Negative.   Skin: Negative.   Allergic/Immunologic: Negative.   Neurological: Negative.   Hematological: Negative.   Psychiatric/Behavioral:  Positive for sleep disturbance. The patient is nervous/anxious.   All other systems reviewed and are negative.  Objective:   Today's Vitals: BP (!) 146/80    Pulse 100    Temp 98.3 F (36.8 C)    Resp 16    Ht 5\' 7"  (1.702 m)    Wt 236 lb (107 kg)    SpO2 98%    BMI 36.96 kg/m   Physical Exam  Assessment & Plan:   Problem List Items Addressed This Visit   None   Outpatient Encounter Medications as of 10/10/2021  Medication Sig   albuterol (PROVENTIL HFA;VENTOLIN HFA) 108 (90 BASE) MCG/ACT inhaler Inhale 2 puffs into the lungs every 4 (four) hours as needed. For wheezing. (Patient not taking: Reported on 10/10/2021)   amLODipine (NORVASC) 5 MG tablet Take 1 tablet (5 mg total) by mouth daily. (Patient not taking: Reported on  10/10/2021)   [DISCONTINUED] bisoprolol-hydrochlorothiazide (ZIAC) 2.5-6.25 MG per tablet Take 1 tablet by mouth daily.   [DISCONTINUED] cetirizine HCl (ZYRTEC) 5 MG/5ML SYRP Take 5 mLs (5 mg total) by mouth daily. (Patient not taking: Reported on 12/17/2018)   [DISCONTINUED] doxycycline (VIBRAMYCIN) 100 MG capsule Take 1 capsule (100 mg total) by mouth 2 (two) times daily. (Patient not taking: Reported on 12/17/2018)   [DISCONTINUED] fexofenadine-pseudoephedrine (ALLEGRA-D) 60-120 MG 12 hr tablet Take 1 tablet by mouth 2 (two) times daily as needed. (Patient not taking: Reported on 12/17/2018)   [DISCONTINUED] hydrOXYzine (VISTARIL) 25 MG capsule Take 1 capsule (25 mg total) by mouth every 6 (six) hours as needed. (Patient  not taking: Reported on 12/17/2018)   [DISCONTINUED] ibuprofen (ADVIL) 200 MG tablet Take 200 mg by mouth every 6 (six) hours as needed for mild pain.   [DISCONTINUED] meloxicam (MOBIC) 15 MG tablet Take 1 tablet (15 mg total) by mouth daily. For 7 days, then daily as needed for pain (Patient not taking: Reported on 12/17/2018)   [DISCONTINUED] predniSONE (DELTASONE) 20 MG tablet 3 tabs po day one, then 2 po daily x 4 days (Patient not taking: Reported on 12/17/2018)   [DISCONTINUED] promethazine-dextromethorphan (PROMETHAZINE-DM) 6.25-15 MG/5ML syrup Take 5 mLs by mouth 4 (four) times daily as needed for cough.   [DISCONTINUED] traMADol (ULTRAM) 50 MG tablet Take 1 tablet (50 mg total) by mouth every 6 (six) hours as needed. (Patient not taking: Reported on 12/17/2018)   No facility-administered encounter medications on file as of 10/10/2021.   PLAN Start fluoxetine 20mg  po qd. Trazodone 25-50mg  po qhs prn. Hydroxyzine 5-10mg  po tid prn. Discussed risks, benefits, and AE to these medications. Pt voices understanding. Encouraged counseling. Will revisit at next visit. Restart amlodipine 5mg  po qd. Discussed lifestyle management of HTN. Can consider increasing to 10mg  po qd at next visit or adding additional agent. Labs collected. Will follow up with the patient as warranted. Anticipate t2dm given extremely elevated sugars in past. Will consider metformin, statin pending results. Return in 6-8 weeks for med check, lab follow up Patient encouraged to call clinic with any questions, comments, or concerns.  Follow-up: No follow-ups on file.   I spent 46 minutes with this patient in chart review, lab review, addressing a number of chronic concerns, and planning follow up.   Maximiano Coss, NP

## 2021-10-10 NOTE — Patient Instructions (Signed)
Mr. Engel -   Doristine Devoid to meet you.  Start fluoxetine - take this every day in the mornings.  Can use hydroxyzine 5-10mg  three times a day as needed.  Can use trazodone 25-50mg  at night before bed as needed.  Get labs drawn tomorrow at [address] - across from [place]. I'll let you know when results are available.  I have refilled amlodipine. Start taking this daily. Ok to take at the same time as the fluoxetine.  See you in 6-8 weeks to touch base and see how things are going.  Let me know if you need anything in the mean time.  Stay well,  Rich

## 2021-10-11 ENCOUNTER — Telehealth: Payer: Self-pay | Admitting: Registered Nurse

## 2021-10-11 ENCOUNTER — Other Ambulatory Visit (INDEPENDENT_AMBULATORY_CARE_PROVIDER_SITE_OTHER): Payer: BC Managed Care – PPO

## 2021-10-11 DIAGNOSIS — R6882 Decreased libido: Secondary | ICD-10-CM | POA: Diagnosis not present

## 2021-10-11 DIAGNOSIS — Z8639 Personal history of other endocrine, nutritional and metabolic disease: Secondary | ICD-10-CM | POA: Diagnosis not present

## 2021-10-11 DIAGNOSIS — I10 Essential (primary) hypertension: Secondary | ICD-10-CM

## 2021-10-11 DIAGNOSIS — E782 Mixed hyperlipidemia: Secondary | ICD-10-CM

## 2021-10-11 DIAGNOSIS — Z130 Encounter for screening for diseases of the blood and blood-forming organs and certain disorders involving the immune mechanism: Secondary | ICD-10-CM

## 2021-10-11 DIAGNOSIS — Z13220 Encounter for screening for lipoid disorders: Secondary | ICD-10-CM | POA: Diagnosis not present

## 2021-10-11 DIAGNOSIS — Z1329 Encounter for screening for other suspected endocrine disorder: Secondary | ICD-10-CM | POA: Diagnosis not present

## 2021-10-11 DIAGNOSIS — N1832 Chronic kidney disease, stage 3b: Secondary | ICD-10-CM

## 2021-10-11 DIAGNOSIS — Z13228 Encounter for screening for other metabolic disorders: Secondary | ICD-10-CM | POA: Diagnosis not present

## 2021-10-11 DIAGNOSIS — E1122 Type 2 diabetes mellitus with diabetic chronic kidney disease: Secondary | ICD-10-CM

## 2021-10-11 LAB — COMPREHENSIVE METABOLIC PANEL
ALT: 12 U/L (ref 0–53)
AST: 12 U/L (ref 0–37)
Albumin: 3.7 g/dL (ref 3.5–5.2)
Alkaline Phosphatase: 96 U/L (ref 39–117)
BUN: 24 mg/dL — ABNORMAL HIGH (ref 6–23)
CO2: 25 mEq/L (ref 19–32)
Calcium: 9 mg/dL (ref 8.4–10.5)
Chloride: 100 mEq/L (ref 96–112)
Creatinine, Ser: 2.42 mg/dL — ABNORMAL HIGH (ref 0.40–1.50)
GFR: 30.9 mL/min — ABNORMAL LOW (ref >60.00)
Glucose, Bld: 295 mg/dL — ABNORMAL HIGH (ref 70–99)
Potassium: 4.2 mEq/L (ref 3.5–5.1)
Sodium: 134 mEq/L — ABNORMAL LOW (ref 135–145)
Total Bilirubin: 0.4 mg/dL (ref 0.2–1.2)
Total Protein: 7.4 g/dL (ref 6.0–8.3)

## 2021-10-11 LAB — CBC WITH DIFFERENTIAL/PLATELET
Basophils Absolute: 0.1 10*3/uL (ref 0.0–0.1)
Basophils Relative: 0.6 % (ref 0.0–3.0)
Eosinophils Absolute: 0.1 10*3/uL (ref 0.0–0.7)
Eosinophils Relative: 0.8 % (ref 0.0–5.0)
HCT: 33 % — ABNORMAL LOW (ref 39.0–52.0)
Hemoglobin: 10.7 g/dL — ABNORMAL LOW (ref 13.0–17.0)
Lymphocytes Relative: 16 % (ref 12.0–46.0)
Lymphs Abs: 1.7 10*3/uL (ref 0.7–4.0)
MCHC: 32.3 g/dL (ref 30.0–36.0)
MCV: 87.9 fl (ref 78.0–100.0)
Monocytes Absolute: 0.8 10*3/uL (ref 0.1–1.0)
Monocytes Relative: 7.2 % (ref 3.0–12.0)
Neutro Abs: 8.2 10*3/uL — ABNORMAL HIGH (ref 1.4–7.7)
Neutrophils Relative %: 75.4 % (ref 43.0–77.0)
Platelets: 268 10*3/uL (ref 150.0–400.0)
RBC: 3.75 Mil/uL — ABNORMAL LOW (ref 4.22–5.81)
RDW: 14.5 % (ref 11.5–15.5)
WBC: 10.8 10*3/uL — ABNORMAL HIGH (ref 4.0–10.5)

## 2021-10-11 LAB — LIPID PANEL
Cholesterol: 412 mg/dL — ABNORMAL HIGH (ref 0–200)
HDL: 53.1 mg/dL (ref >39.00)
LDL Cholesterol: 323 mg/dL — ABNORMAL HIGH (ref 0–99)
NonHDL: 358.95
Total CHOL/HDL Ratio: 8
Triglycerides: 181 mg/dL — ABNORMAL HIGH (ref 0.0–149.0)
VLDL: 36.2 mg/dL (ref 0.0–40.0)

## 2021-10-11 LAB — TESTOSTERONE: Testosterone: 195.26 ng/dL — ABNORMAL LOW (ref 300.00–890.00)

## 2021-10-11 LAB — TSH: TSH: 4.21 u[IU]/mL (ref 0.35–5.50)

## 2021-10-11 LAB — HEMOGLOBIN A1C: Hgb A1c MFr Bld: 11.6 % — ABNORMAL HIGH (ref 4.6–6.5)

## 2021-10-12 ENCOUNTER — Encounter: Payer: Self-pay | Admitting: Registered Nurse

## 2021-10-12 MED ORDER — TOUJEO MAX SOLOSTAR 300 UNIT/ML ~~LOC~~ SOPN: 12.0000 [IU] | PEN_INJECTOR | Freq: Every evening | SUBCUTANEOUS | 1 refills | Status: DC

## 2021-10-12 MED ORDER — ROSUVASTATIN CALCIUM 10 MG PO TABS: 10.0000 mg | ORAL_TABLET | Freq: Every day | ORAL | 3 refills | Status: DC

## 2021-10-12 MED ORDER — EMPAGLIFLOZIN 10 MG PO TABS: 10.0000 mg | ORAL_TABLET | Freq: Every day | ORAL | 0 refills | Status: DC

## 2021-10-12 NOTE — Telephone Encounter (Signed)
Called patient and confirmed ID.  Discussed findings on labs:  CKD stage 3b T2dm HLD  Start on: Empagliflozin 10mg  po qd before breakfast Toujeo 12 units before bed Rosuvastatin 10mg  po qd with dinner.  Discussed risks, benefits, AE of meds.  Pt voices understanding  Will keep follow up as scheduled in March.  Kathrin Ruddy, NP

## 2021-10-14 ENCOUNTER — Other Ambulatory Visit: Payer: Self-pay | Admitting: Registered Nurse

## 2021-10-14 ENCOUNTER — Telehealth: Payer: Self-pay

## 2021-10-14 DIAGNOSIS — E1122 Type 2 diabetes mellitus with diabetic chronic kidney disease: Secondary | ICD-10-CM

## 2021-10-14 DIAGNOSIS — N1832 Chronic kidney disease, stage 3b: Secondary | ICD-10-CM

## 2021-10-14 MED ORDER — TOUJEO MAX SOLOSTAR 300 UNIT/ML ~~LOC~~ SOPN: 12.0000 [IU] | PEN_INJECTOR | Freq: Every evening | SUBCUTANEOUS | 1 refills | Status: DC

## 2021-10-14 NOTE — Telephone Encounter (Signed)
Caller name:Kenyata Winona Legato   On DPR? :Yes  Call back [phone]  Provider they see: Delfino Lovett  Reason for call:Pt called all medications that was sent on the 18th of Jan was sent to the wrong pharmacy and need to be sent to  [place], [place] - [address]  [address] [place] [address]  empagliflozin (JARDIANCE) 10 MG TABS tablet  insulin glargine, 2 Unit Dial, (TOUJEO MAX SOLOSTAR) 300 UNIT/ML Solostar Pen [[id number]]  rosuvastatin (CRESTOR) 10 MG tablet

## 2021-10-14 NOTE — Telephone Encounter (Signed)
That's the toujeo max solostar -  Got it  Sent  Thanks  [employer]

## 2021-10-17 ENCOUNTER — Other Ambulatory Visit: Payer: Self-pay

## 2021-10-17 ENCOUNTER — Telehealth: Payer: Self-pay | Admitting: Registered Nurse

## 2021-10-17 DIAGNOSIS — E782 Mixed hyperlipidemia: Secondary | ICD-10-CM

## 2021-10-17 DIAGNOSIS — N1832 Type 2 diabetes mellitus with diabetic chronic kidney disease: Secondary | ICD-10-CM

## 2021-10-17 DIAGNOSIS — E1122 Type 2 diabetes mellitus with diabetic chronic kidney disease: Secondary | ICD-10-CM

## 2021-10-17 MED ORDER — EMPAGLIFLOZIN 10 MG PO TABS: 10.0000 mg | ORAL_TABLET | Freq: Every day | ORAL | 0 refills | Status: DC

## 2021-10-17 MED ORDER — ROSUVASTATIN CALCIUM 10 MG PO TABS: 10.0000 mg | ORAL_TABLET | Freq: Every day | ORAL | 3 refills | Status: DC

## 2021-10-17 NOTE — Telephone Encounter (Signed)
Pt called in stating that the Jardiance and the Rosuvastatin went to the wrong pharmacy, these medications need to go to the [facility] on [address]   Please advise pt would like this done asap since he has been out of the medication due to to going to the incorrect pharmacy

## 2021-10-17 NOTE — Telephone Encounter (Signed)
Rx have been sent to [facility]

## 2021-11-29 ENCOUNTER — Ambulatory Visit (INDEPENDENT_AMBULATORY_CARE_PROVIDER_SITE_OTHER): Payer: 59 | Admitting: Registered Nurse

## 2021-11-29 ENCOUNTER — Other Ambulatory Visit: Payer: Self-pay

## 2021-11-29 ENCOUNTER — Encounter: Payer: Self-pay | Admitting: Registered Nurse

## 2021-11-29 ENCOUNTER — Other Ambulatory Visit: Payer: Self-pay | Admitting: Registered Nurse

## 2021-11-29 VITALS — BP 202/97 | HR 90 | Temp 98.0°F | Resp 18 | Ht 67.0 in | Wt 236.8 lb

## 2021-11-29 DIAGNOSIS — R944 Abnormal results of kidney function studies: Secondary | ICD-10-CM

## 2021-11-29 DIAGNOSIS — N1832 Chronic kidney disease, stage 3b: Secondary | ICD-10-CM

## 2021-11-29 DIAGNOSIS — I10 Essential (primary) hypertension: Secondary | ICD-10-CM

## 2021-11-29 LAB — CBC WITH DIFFERENTIAL/PLATELET
Basophils Absolute: 0.1 10*3/uL (ref 0.0–0.1)
Basophils Relative: 0.5 % (ref 0.0–3.0)
Eosinophils Absolute: 0.1 10*3/uL (ref 0.0–0.7)
Eosinophils Relative: 0.7 % (ref 0.0–5.0)
HCT: 32.8 % — ABNORMAL LOW (ref 39.0–52.0)
Hemoglobin: 10.6 g/dL — ABNORMAL LOW (ref 13.0–17.0)
Lymphocytes Relative: 15 % (ref 12.0–46.0)
Lymphs Abs: 1.8 10*3/uL (ref 0.7–4.0)
MCHC: 32.3 g/dL (ref 30.0–36.0)
MCV: 87.3 fl (ref 78.0–100.0)
Monocytes Absolute: 0.7 10*3/uL (ref 0.1–1.0)
Monocytes Relative: 5.8 % (ref 3.0–12.0)
Neutro Abs: 9.4 10*3/uL — ABNORMAL HIGH (ref 1.4–7.7)
Neutrophils Relative %: 78 % — ABNORMAL HIGH (ref 43.0–77.0)
Platelets: 326 10*3/uL (ref 150.0–400.0)
RBC: 3.75 Mil/uL — ABNORMAL LOW (ref 4.22–5.81)
RDW: 14.1 % (ref 11.5–15.5)
WBC: 12 10*3/uL — ABNORMAL HIGH (ref 4.0–10.5)

## 2021-11-29 LAB — COMPREHENSIVE METABOLIC PANEL
ALT: 15 U/L (ref 0–53)
AST: 20 U/L (ref 0–37)
Albumin: 4.1 g/dL (ref 3.5–5.2)
Alkaline Phosphatase: 117 U/L (ref 39–117)
BUN: 29 mg/dL — ABNORMAL HIGH (ref 6–23)
CO2: 26 mEq/L (ref 19–32)
Calcium: 9.7 mg/dL (ref 8.4–10.5)
Chloride: 102 mEq/L (ref 96–112)
Creatinine, Ser: 2.72 mg/dL — ABNORMAL HIGH (ref 0.40–1.50)
GFR: 26.83 mL/min — ABNORMAL LOW (ref >60.00)
Glucose, Bld: 173 mg/dL — ABNORMAL HIGH (ref 70–99)
Potassium: 4.6 mEq/L (ref 3.5–5.1)
Sodium: 136 mEq/L (ref 135–145)
Total Bilirubin: 0.3 mg/dL (ref 0.2–1.2)
Total Protein: 7.3 g/dL (ref 6.0–8.3)

## 2021-11-29 MED ORDER — AMLODIPINE-OLMESARTAN 10-40 MG PO TABS: ORAL_TABLET | ORAL | 0 refills | Status: DC

## 2021-11-29 NOTE — Progress Notes (Signed)
? ?Established Patient Office Visit ? ?Subjective:  ?Patient ID: Luis Wood, male    DOB: 27-Jun-1971  Age: 51 y.o. MRN: [id number] ? ?CC:  ?Chief Complaint  ?Patient presents with  ? Follow-up  ?  Patient states he is here for a follow up with med and lab review.  ? ? ?HPI ?Luis Wood presents for med check, lab review, htn. ? ?Anxiety ?Started on fluoxetine 20mg  po qd, hydroxyzine 10mg  po tid prn, trazodone 25-50mg  po qhs prn.  ?Anxiety much improved. Sleep a lot better. ? ?Hypertension: ?Patient Currently taking: amlodipine 5mg  po qd.  ?Good effect. No AEs. ?Denies CV symptoms including: chest pain, shob, doe, headache, visual changes, fatigue, claudication, and dependent edema.  ? ?Previous readings and labs: ?BP Readings from Last 3 Encounters:  ?11/29/21 (!) 202/97  ?10/10/21 (!) 146/80  ?06/22/21 (!) 228/131  ? ?Lab Results  ?Component Value Date  ? CREATININE 2.42 (H) 10/11/2021  ? ?Notes BP spikes with caffeine. Takes half dose preworkout 2-3 times a week. ? ?Noted to have CKD on last cmp with GFR 30. Will recheck today. ? ?Noted to be diabetic with A1c 11.6, he has been extremely strict with diet and exercise in past 6 weeks. Wants to avoid medication for this. Will recheck GFR before initiating any medication as I would prefer to use DPP4 or GLP if able for promising CV and renal protection data.  ? ? ?Past Medical History:  ?Diagnosis Date  ? Asthma   ? Hypertension   ? ? ?History reviewed. No pertinent surgical history. ? ?Family History  ?Problem Relation Age of Onset  ? Hypertension Father   ? ? ?Social History  ? ?Socioeconomic History  ? Marital status: Significant Other  ?  Spouse name: Not on file  ? Number of children: Not on file  ? Years of education: Not on file  ? Highest education level: Not on file  ?Occupational History  ? Not on file  ?Tobacco Use  ? Smoking status: Never  ? Smokeless tobacco: Never  ?Substance and Sexual Activity  ? Alcohol use: No  ? Drug use: No  ? Sexual  activity: Not on file  ?Other Topics Concern  ? Not on file  ?Social History Narrative  ? Not on file  ? ?Social Determinants of Health  ? ?Financial Resource Strain: Not on file  ?Food Insecurity: Not on file  ?Transportation Needs: Not on file  ?Physical Activity: Not on file  ?Stress: Not on file  ?Social Connections: Not on file  ?Intimate Partner Violence: Not on file  ? ? ?Outpatient Medications Prior to Visit  ?Medication Sig Dispense Refill  ? albuterol (PROVENTIL HFA;VENTOLIN HFA) 108 (90 BASE) MCG/ACT inhaler Inhale 2 puffs into the lungs every 4 (four) hours as needed. For wheezing.    ? empagliflozin (JARDIANCE) 10 MG TABS tablet Take 1 tablet (10 mg total) by mouth daily before breakfast. 90 tablet 0  ? FLUoxetine (PROZAC) 20 MG capsule Take 1 capsule (20 mg total) by mouth daily. 90 capsule 3  ? hydrOXYzine (ATARAX) 10 MG tablet Take 1 tablet (10 mg total) by mouth 3 (three) times daily as needed. 30 tablet 0  ? insulin glargine, 2 Unit Dial, (TOUJEO MAX SOLOSTAR) 300 UNIT/ML Solostar Pen Inject 12 Units into the skin at bedtime. 3 mL 1  ? rosuvastatin (CRESTOR) 10 MG tablet Take 1 tablet (10 mg total) by mouth daily. 90 tablet 3  ? traZODone (DESYREL) 50 MG tablet Take 0.5-1  tablets (25-50 mg total) by mouth at bedtime as needed for sleep. 30 tablet 3  ? amLODipine (NORVASC) 5 MG tablet Take 1 tablet (5 mg total) by mouth daily. 90 tablet 0  ? ?No facility-administered medications prior to visit.  ? ? ?Allergies  ?Allergen Reactions  ? Aspirin Other (See Comments)  ?  Dizziness and fainting, this was a childhood allergy  ? ? ?ROS ?Review of Systems  ?Constitutional: Negative.   ?HENT: Negative.    ?Eyes: Negative.   ?Respiratory: Negative.    ?Cardiovascular: Negative.   ?Gastrointestinal: Negative.   ?Genitourinary: Negative.   ?Musculoskeletal: Negative.   ?Skin: Negative.   ?Neurological: Negative.   ?Psychiatric/Behavioral: Negative.    ?All other systems reviewed and are negative. ? ?   ?Objective:  ?  ?Physical Exam ?Constitutional:   ?   General: He is not in acute distress. ?   Appearance: Normal appearance. He is normal weight. He is not ill-appearing, toxic-appearing or diaphoretic.  ?Cardiovascular:  ?   Rate and Rhythm: Normal rate and regular rhythm.  ?   Heart sounds: Normal heart sounds. No murmur heard. ?  No friction rub. No gallop.  ?Pulmonary:  ?   Effort: Pulmonary effort is normal. No respiratory distress.  ?   Breath sounds: Normal breath sounds. No stridor. No wheezing, rhonchi or rales.  ?Chest:  ?   Chest wall: No tenderness.  ?Neurological:  ?   General: No focal deficit present.  ?   Mental Status: He is alert and oriented to person, place, and time. Mental status is at baseline.  ?Psychiatric:     ?   Mood and Affect: Mood normal.     ?   Behavior: Behavior normal.     ?   Thought Content: Thought content normal.     ?   Judgment: Judgment normal.  ? ? ?BP (!) 202/97   Pulse 90   Temp 98 ?F (36.7 ?C) (Temporal)   Resp 18   Ht 5\' 7"  (1.702 m)   Wt 236 lb 12.8 oz (107.4 kg)   SpO2 96%   BMI 37.09 kg/m?  ?Wt Readings from Last 3 Encounters:  ?11/29/21 236 lb 12.8 oz (107.4 kg)  ?10/10/21 236 lb (107 kg)  ?07/02/17 228 lb (103.4 kg)  ? ? ? ?Health Maintenance Due  ?Topic Date Due  ? COVID-19 Vaccine (1) Never done  ? URINE MICROALBUMIN  Never done  ? HIV Screening  Never done  ? Hepatitis C Screening  Never done  ? TETANUS/TDAP  04/25/2020  ? ? ?There are no preventive care reminders to display for this patient. ? ?Lab Results  ?Component Value Date  ? TSH 4.21 10/11/2021  ? ?Lab Results  ?Component Value Date  ? WBC 10.8 (H) 10/11/2021  ? HGB 10.7 (L) 10/11/2021  ? HCT 33.0 (L) 10/11/2021  ? MCV 87.9 10/11/2021  ? PLT 268.0 10/11/2021  ? ?Lab Results  ?Component Value Date  ? NA 134 (L) 10/11/2021  ? K 4.2 10/11/2021  ? CO2 25 10/11/2021  ? GLUCOSE 295 (H) 10/11/2021  ? BUN 24 (H) 10/11/2021  ? CREATININE 2.42 (H) 10/11/2021  ? BILITOT 0.4 10/11/2021  ? ALKPHOS 96  10/11/2021  ? AST 12 10/11/2021  ? ALT 12 10/11/2021  ? PROT 7.4 10/11/2021  ? ALBUMIN 3.7 10/11/2021  ? CALCIUM 9.0 10/11/2021  ? GFR 30.90 (L) 10/11/2021  ? ?Lab Results  ?Component Value Date  ? CHOL 412 (H) 10/11/2021  ? ?  Lab Results  ?Component Value Date  ? HDL 53.10 10/11/2021  ? ?Lab Results  ?Component Value Date  ? LDLCALC 323 (H) 10/11/2021  ? ?Lab Results  ?Component Value Date  ? TRIG 181.0 (H) 10/11/2021  ? ?Lab Results  ?Component Value Date  ? CHOLHDL 8 10/11/2021  ? ?Lab Results  ?Component Value Date  ? HGBA1C 11.6 (H) 10/11/2021  ? ? ?  ?Assessment & Plan:  ? ?Problem List Items Addressed This Visit   ?None ?Visit Diagnoses   ? ? Stage 3b chronic kidney disease (CKD) ([place])    -  Primary  ? Relevant Medications  ? amLODipine-olmesartan (AZOR) 10-40 MG tablet  ? Other Relevant Orders  ? CBC with Differential/Platelet  ? Comprehensive metabolic panel  ? Primary hypertension      ? Relevant Medications  ? amLODipine-olmesartan (AZOR) 10-40 MG tablet  ? Other Relevant Orders  ? CBC with Differential/Platelet  ? Comprehensive metabolic panel  ? ?  ? ? ?Meds ordered this encounter  ?Medications  ? amLODipine-olmesartan (AZOR) 10-40 MG tablet  ?  Sig: Take 0.5 tablets by mouth daily for 8 days, THEN 1 tablet daily.  ?  Dispense:  90 tablet  ?  Refill:  0  ?  Order Specific Question:   Supervising Provider  ?  Answer:   Carlota Raspberry, JEFFREY R [[id number]]  ? ? ?Follow-up: Return in about 6 weeks (around 01/10/2022) for recheck t2dm.  ? ?PLAN ?Recheck gfr. ?Increase antihypertensives to amlodipine-olmesartan 10-40mg  po qd.  ?Return in 6 weeks to recheck a1c, lipids, gfr.  ?Patient encouraged to call clinic with any questions, comments, or concerns. ? ?Maximiano Coss, NP ?

## 2021-11-29 NOTE — Patient Instructions (Addendum)
Luis Wood  - ? ?Great to see you ? ?Stop amlodipine on it's own - will add amlodipine-olmesartan 10-40mg  daily. ? ?BP very high today. Goal is below 130/80. ? ?Continue to limit processed foods, drink plenty of water, exercise regularly. Going to take some hard work, but we can get things under control ? ?See you in 6 weeks to recheck again ? ?Thanks, ? ?[name]  ? ? ? ?If you have lab work done today you will be contacted with your lab results within the next 2 weeks.  If you have not heard from [place] then please contact us. The fastest way to get your results is to register for My Chart. ? ? ?IF you received an x-ray today, you will receive an invoice from [facility]. Please contact [facility] at [phone] with questions or concerns regarding your invoice.  ? ?IF you received labwork today, you will receive an invoice from [place]. Please contact [facility] at [phone] with questions or concerns regarding your invoice.  ? ?Our billing staff will not be able to assist you with questions regarding bills from these companies. ? ?You will be contacted with the lab results as soon as they are available. The fastest way to get your results is to activate your My Chart account. Instructions are located on the last page of this paperwork. If you have not heard from [place] regarding the results in 2 weeks, please contact this office. ?  ? ? ?

## 2022-01-10 ENCOUNTER — Ambulatory Visit: Payer: 59 | Admitting: Registered Nurse

## 2022-01-26 ENCOUNTER — Other Ambulatory Visit: Payer: Self-pay | Admitting: Registered Nurse

## 2022-01-26 DIAGNOSIS — N1832 Chronic kidney disease, stage 3b: Secondary | ICD-10-CM

## 2022-02-16 ENCOUNTER — Encounter: Payer: Self-pay | Admitting: Registered Nurse

## 2022-02-16 ENCOUNTER — Other Ambulatory Visit: Payer: Self-pay

## 2022-02-16 ENCOUNTER — Ambulatory Visit (INDEPENDENT_AMBULATORY_CARE_PROVIDER_SITE_OTHER): Payer: 59 | Admitting: Registered Nurse

## 2022-02-16 VITALS — BP 196/99 | HR 102 | Temp 98.3°F | Resp 18 | Ht 67.0 in | Wt 239.6 lb

## 2022-02-16 DIAGNOSIS — E1122 Type 2 diabetes mellitus with diabetic chronic kidney disease: Secondary | ICD-10-CM | POA: Insufficient documentation

## 2022-02-16 DIAGNOSIS — Z794 Long term (current) use of insulin: Secondary | ICD-10-CM | POA: Diagnosis not present

## 2022-02-16 DIAGNOSIS — I10 Essential (primary) hypertension: Secondary | ICD-10-CM

## 2022-02-16 DIAGNOSIS — N1832 Chronic kidney disease, stage 3b: Secondary | ICD-10-CM | POA: Diagnosis not present

## 2022-02-16 LAB — POCT GLYCOSYLATED HEMOGLOBIN (HGB A1C): Hemoglobin A1C: 8.4 % — AB (ref 4.0–5.6)

## 2022-02-16 MED ORDER — TOUJEO MAX SOLOSTAR 300 UNIT/ML ~~LOC~~ SOPN: 16.0000 [IU] | PEN_INJECTOR | Freq: Every evening | SUBCUTANEOUS | 1 refills | Status: DC

## 2022-02-16 MED ORDER — EMPAGLIFLOZIN 25 MG PO TABS: 25.0000 mg | ORAL_TABLET | Freq: Every day | ORAL | 0 refills | Status: DC

## 2022-02-16 NOTE — Assessment & Plan Note (Signed)
Uncontrolled. Refer to hypertension clinic given extreme elevation in diastolic bp. Continue current meds for time being.

## 2022-02-16 NOTE — Progress Notes (Signed)
Established Patient Office Visit  Subjective:  Patient ID: Luis Wood, male    DOB: [birth year]  Age: 51 y.o. MRN: [id number]  CC:  Chief Complaint  Patient presents with   Diabetes    Patient is here to follow up on diabetes     HPI Luis Wood presents for t2dm  Last A1c:  Lab Results  Component Value Date   HGBA1C 8.4 (A) 02/16/2022    Currently taking: glargine 12 units nightly, jardiance 10 mg po qd No new complications Reports good compliance with medications Diet has been steady since last visit, overall improved Exercise habits have been steady  Hypertension: Patient Currently taking: amlodipine-olmesartan 10-40mg  po qd Good effect. No AEs. Denies CV symptoms including: chest pain, shob, doe, headache, visual changes, fatigue, claudication, and dependent edema.   Previous readings and labs: BP Readings from Last 3 Encounters:  02/16/22 (!) 206/96  11/29/21 (!) 202/97  10/10/21 (!) 146/80   Lab Results  Component Value Date   CREATININE 2.72 (H) 11/29/2021     Outpatient Medications Prior to Visit  Medication Sig Dispense Refill   albuterol (PROVENTIL HFA;VENTOLIN HFA) 108 (90 BASE) MCG/ACT inhaler Inhale 2 puffs into the lungs every 4 (four) hours as needed. For wheezing.     amLODipine-olmesartan (AZOR) 10-40 MG tablet Take 0.5 tablets by mouth daily for 8 days, THEN 1 tablet daily. 90 tablet 0   FLUoxetine (PROZAC) 20 MG capsule Take 1 capsule (20 mg total) by mouth daily. 90 capsule 3   hydrOXYzine (ATARAX) 10 MG tablet Take 1 tablet (10 mg total) by mouth 3 (three) times daily as needed. 30 tablet 0   rosuvastatin (CRESTOR) 10 MG tablet Take 1 tablet (10 mg total) by mouth daily. 90 tablet 3   traZODone (DESYREL) 50 MG tablet Take 0.5-1 tablets (25-50 mg total) by mouth at bedtime as needed for sleep. 30 tablet 3   insulin glargine, 2 Unit Dial, (TOUJEO MAX SOLOSTAR) 300 UNIT/ML Solostar Pen Inject 12 Units into the skin at bedtime. 3 mL 1    JARDIANCE 10 MG TABS tablet TAKE 1 TABLET BY MOUTH ONCE DAILY BEFORE BREAKFAST 90 tablet 0   No facility-administered medications prior to visit.    Review of Systems  Constitutional: Negative.   HENT: Negative.    Eyes: Negative.   Respiratory: Negative.    Cardiovascular: Negative.   Gastrointestinal: Negative.   Genitourinary: Negative.   Musculoskeletal: Negative.   Skin: Negative.   Neurological: Negative.   Psychiatric/Behavioral: Negative.    All other systems reviewed and are negative.    Objective:     BP (!) 206/96   Pulse (!) 102   Temp 98.3 F (36.8 C) (Temporal)   Resp 18   Ht 5\' 7"  (1.702 m)   Wt 239 lb 9.6 oz (108.7 kg)   SpO2 100%   BMI 37.53 kg/m   Wt Readings from Last 3 Encounters:  02/16/22 239 lb 9.6 oz (108.7 kg)  11/29/21 236 lb 12.8 oz (107.4 kg)  10/10/21 236 lb (107 kg)   Physical Exam Constitutional:      General: He is not in acute distress.    Appearance: Normal appearance. He is normal weight. He is not ill-appearing, toxic-appearing or diaphoretic.  Cardiovascular:     Rate and Rhythm: Normal rate and regular rhythm.     Heart sounds: Normal heart sounds. No murmur heard.   No friction rub. No gallop.  Pulmonary:     Effort: Pulmonary  effort is normal. No respiratory distress.     Breath sounds: Normal breath sounds. No stridor. No wheezing, rhonchi or rales.  Chest:     Chest wall: No tenderness.  Neurological:     General: No focal deficit present.     Mental Status: He is alert and oriented to person, place, and time. Mental status is at baseline.  Psychiatric:        Mood and Affect: Mood normal.        Behavior: Behavior normal.        Thought Content: Thought content normal.        Judgment: Judgment normal.    Results for orders placed or performed in visit on 02/16/22  POCT glycosylated hemoglobin (Hb A1C)  Result Value Ref Range   Hemoglobin A1C 8.4 (A) 4.0 - 5.6 %   HbA1c POC (<> result, manual entry)      HbA1c, POC (prediabetic range)     HbA1c, POC (controlled diabetic range)        The ASCVD Risk score (Arnett DK, et al., 2019) failed to calculate for the following reasons:   The valid systolic blood pressure range is 90 to 200 mmHg   The valid total cholesterol range is 130 to 320 mg/dL    Assessment & Plan:   Problem List Items Addressed This Visit       Cardiovascular and Mediastinum   Essential hypertension    Uncontrolled. Refer to hypertension clinic given extreme elevation in diastolic bp. Continue current meds for time being.       Relevant Orders   Ambulatory referral to [facility]     Endocrine   Type 2 diabetes mellitus with stage 3b chronic kidney disease, with long-term current use of insulin ([place]) - Primary    Improved but not at goal.  Increase glargine to 16 units daily.  Keep jardiance at 10mg  po qd Recheck in 3 mo Patient encouraged to call clinic with any questions, comments, or concerns.         Relevant Medications   insulin glargine, 2 Unit Dial, (TOUJEO MAX SOLOSTAR) 300 UNIT/ML Solostar Pen   Other Relevant Orders   POCT glycosylated hemoglobin (Hb A1C) (Completed)   Ambulatory referral to [facility]    Meds ordered this encounter  Medications   insulin glargine, 2 Unit Dial, (TOUJEO MAX SOLOSTAR) 300 UNIT/ML Solostar Pen    Sig: Inject 16 Units into the skin at bedtime.    Dispense:  3 mL    Refill:  1    Order Specific Question:   Supervising Provider    Answer:   Carlota Raspberry, JEFFREY R [[id number]]   DISCONTD: empagliflozin (JARDIANCE) 25 MG TABS tablet    Sig: Take 1 tablet (25 mg total) by mouth daily before breakfast.    Dispense:  90 tablet    Refill:  0    Order Specific Question:   Supervising Provider    Answer:   Carlota Raspberry, JEFFREY R [[id number]]    Return in about 3 months (around 05/19/2022) for Chronic Conditions.    Maximiano Coss, NP

## 2022-02-16 NOTE — Assessment & Plan Note (Signed)
Improved but not at goal.  Increase glargine to 16 units daily.  Keep jardiance at 10mg  po qd Recheck in 3 mo Patient encouraged to call clinic with any questions, comments, or concerns.

## 2022-02-16 NOTE — Patient Instructions (Addendum)
Mr. Schoon -   Doristine Devoid work on the sugars  Let's consult Dr. Oval Linsey on blood pressure  I'll follow on charts with [facility] and we can determine if you're a good candidate for ozempic  See you in 3 mo  Thanks  Rich     If you have lab work done today you will be contacted with your lab results within the next 2 weeks.  If you have not heard from [place] then please contact us. The fastest way to get your results is to register for My Chart.   IF you received an x-ray today, you will receive an invoice from [facility]. Please contact [facility] at [phone] with questions or concerns regarding your invoice.   IF you received labwork today, you will receive an invoice from [place]. Please contact [facility] at [phone] with questions or concerns regarding your invoice.   Our billing staff will not be able to assist you with questions regarding bills from these companies.  You will be contacted with the lab results as soon as they are available. The fastest way to get your results is to activate your My Chart account. Instructions are located on the last page of this paperwork. If you have not heard from [place] regarding the results in 2 weeks, please contact this office.

## 2022-02-24 ENCOUNTER — Telehealth: Payer: Self-pay

## 2022-02-24 NOTE — Telephone Encounter (Signed)
Given to Dr. Orland Mustard to review. Once reviewed the progress note will be scanned into the chart.

## 2022-02-24 NOTE — Telephone Encounter (Signed)
Given to Dr. Orland Mustard for review. Once reviewed the CKD progress note will be scanned into the chart.

## 2022-03-17 ENCOUNTER — Encounter: Payer: Self-pay | Admitting: Cardiovascular Disease

## 2022-05-22 ENCOUNTER — Encounter: Payer: Self-pay | Admitting: Family Medicine

## 2022-05-22 ENCOUNTER — Ambulatory Visit: Payer: 59 | Admitting: Registered Nurse

## 2022-05-22 ENCOUNTER — Ambulatory Visit (INDEPENDENT_AMBULATORY_CARE_PROVIDER_SITE_OTHER): Payer: 59 | Admitting: Family Medicine

## 2022-05-22 VITALS — BP 138/80 | HR 106 | Temp 99.9°F | Resp 17 | Ht 67.0 in | Wt 233.4 lb

## 2022-05-22 DIAGNOSIS — E1169 Type 2 diabetes mellitus with other specified complication: Secondary | ICD-10-CM | POA: Diagnosis not present

## 2022-05-22 DIAGNOSIS — E1122 Type 2 diabetes mellitus with diabetic chronic kidney disease: Secondary | ICD-10-CM

## 2022-05-22 DIAGNOSIS — N1832 Chronic kidney disease, stage 3b: Secondary | ICD-10-CM

## 2022-05-22 DIAGNOSIS — E785 Hyperlipidemia, unspecified: Secondary | ICD-10-CM | POA: Diagnosis not present

## 2022-05-22 DIAGNOSIS — Z794 Long term (current) use of insulin: Secondary | ICD-10-CM | POA: Diagnosis not present

## 2022-05-22 DIAGNOSIS — I10 Essential (primary) hypertension: Secondary | ICD-10-CM

## 2022-05-22 DIAGNOSIS — E669 Obesity, unspecified: Secondary | ICD-10-CM | POA: Insufficient documentation

## 2022-05-22 DIAGNOSIS — N184 Chronic kidney disease, stage 4 (severe): Secondary | ICD-10-CM

## 2022-05-22 LAB — CBC WITH DIFFERENTIAL/PLATELET
Basophils Absolute: 0.1 10*3/uL (ref 0.0–0.1)
Basophils Relative: 0.5 % (ref 0.0–3.0)
Eosinophils Absolute: 0.1 10*3/uL (ref 0.0–0.7)
Eosinophils Relative: 0.6 % (ref 0.0–5.0)
HCT: 36.6 % — ABNORMAL LOW (ref 39.0–52.0)
Hemoglobin: 11.8 g/dL — ABNORMAL LOW (ref 13.0–17.0)
Lymphocytes Relative: 18.6 % (ref 12.0–46.0)
Lymphs Abs: 2.2 10*3/uL (ref 0.7–4.0)
MCHC: 32.3 g/dL (ref 30.0–36.0)
MCV: 87.6 fl (ref 78.0–100.0)
Monocytes Absolute: 0.8 10*3/uL (ref 0.1–1.0)
Monocytes Relative: 7 % (ref 3.0–12.0)
Neutro Abs: 8.7 10*3/uL — ABNORMAL HIGH (ref 1.4–7.7)
Neutrophils Relative %: 73.3 % (ref 43.0–77.0)
Platelets: 252 10*3/uL (ref 150.0–400.0)
RBC: 4.17 Mil/uL — ABNORMAL LOW (ref 4.22–5.81)
RDW: 14.8 % (ref 11.5–15.5)
WBC: 11.9 10*3/uL — ABNORMAL HIGH (ref 4.0–10.5)

## 2022-05-22 LAB — HEPATIC FUNCTION PANEL
ALT: 15 U/L (ref 0–53)
AST: 16 U/L (ref 0–37)
Albumin: 3.9 g/dL (ref 3.5–5.2)
Alkaline Phosphatase: 100 U/L (ref 39–117)
Bilirubin, Direct: 0.1 mg/dL (ref 0.0–0.3)
Total Bilirubin: 0.3 mg/dL (ref 0.2–1.2)
Total Protein: 7.6 g/dL (ref 6.0–8.3)

## 2022-05-22 LAB — BASIC METABOLIC PANEL
BUN: 33 mg/dL — ABNORMAL HIGH (ref 6–23)
CO2: 24 mEq/L (ref 19–32)
Calcium: 9.2 mg/dL (ref 8.4–10.5)
Chloride: 99 mEq/L (ref 96–112)
Creatinine, Ser: 3.52 mg/dL — ABNORMAL HIGH (ref 0.40–1.50)
GFR: 19.62 mL/min — ABNORMAL LOW (ref >60.00)
Glucose, Bld: 227 mg/dL — ABNORMAL HIGH (ref 70–99)
Potassium: 3.9 mEq/L (ref 3.5–5.1)
Sodium: 135 mEq/L (ref 135–145)

## 2022-05-22 LAB — LIPID PANEL
Cholesterol: 397 mg/dL — ABNORMAL HIGH (ref 0–200)
HDL: 50.3 mg/dL (ref >39.00)
NonHDL: 346.48
Total CHOL/HDL Ratio: 8
Triglycerides: 239 mg/dL — ABNORMAL HIGH (ref 0.0–149.0)
VLDL: 47.8 mg/dL — ABNORMAL HIGH (ref 0.0–40.0)

## 2022-05-22 LAB — HEMOGLOBIN A1C: Hgb A1c MFr Bld: 8.3 % — ABNORMAL HIGH (ref 4.6–6.5)

## 2022-05-22 LAB — LDL CHOLESTEROL, DIRECT: Direct LDL: 288 mg/dL

## 2022-05-22 LAB — TSH: TSH: 1.96 u[IU]/mL (ref 0.35–5.50)

## 2022-05-22 LAB — MICROALBUMIN / CREATININE URINE RATIO
Creatinine,U: 90.2 mg/dL
Microalb Creat Ratio: 311.8 mg/g — ABNORMAL HIGH (ref 0.0–30.0)
Microalb, Ur: 281.4 mg/dL — ABNORMAL HIGH (ref 0.0–1.9)

## 2022-05-22 MED ORDER — ROSUVASTATIN CALCIUM 10 MG PO TABS: 10.0000 mg | ORAL_TABLET | Freq: Every day | ORAL | 3 refills | Status: DC

## 2022-05-22 MED ORDER — TOUJEO MAX SOLOSTAR 300 UNIT/ML ~~LOC~~ SOPN: 16.0000 [IU] | PEN_INJECTOR | Freq: Every day | SUBCUTANEOUS | 1 refills | Status: DC

## 2022-05-22 NOTE — Assessment & Plan Note (Signed)
Chronic problem.  Currently on Crestor 10mg daily w/o difficulty.  Check labs.  Adjust meds prn  

## 2022-05-22 NOTE — Patient Instructions (Signed)
Establish w/ a new provider to avoid a lapse in care We'll notify you of your lab results and make any changes if needed Send [place] a copy of your eye exam so we can update your chart Continue to work on healthy diet and regular exercise- you're doing great! Call with any questions or concerns Stay Safe!  Stay Healthy! Happy Labor Day!!!

## 2022-05-22 NOTE — Progress Notes (Signed)
   Subjective:    Patient ID: KOLBEE BOGUSZ, male    DOB: February 25, 1971, 51 y.o.   MRN: [id number]  HPI DM- chronic problem, on Toujeo.  Due for eye exam, microalbumin.  UTD on foot exam.  No symptomatic lows.  Last A1C 8.4%  Hyperlipidemia- chronic problem, on Crestor 10mg  daily.  Denies abd pain, N/V.  HTN- chronic problem, on Amlodipine Olmesartan 10/40mg  daily w/ adequate control.  Last BP 206/96.  Denies CP, SOB, HAs, visual changes, edema.  Obesity- ongoing issue for pt.  Down 7 lbs since last visit.  Pt reports he is working out 3x/week for 45-60 minutes, has increased fruits and veggies.  Fasts for 2 days a week.   Review of Systems For ROS see HPI     Objective:   Physical Exam Vitals reviewed.  Constitutional:      General: He is not in acute distress.    Appearance: Normal appearance. He is well-developed. He is obese. He is not ill-appearing.  HENT:     Head: Normocephalic and atraumatic.  Eyes:     Extraocular Movements: Extraocular movements intact.     Conjunctiva/sclera: Conjunctivae normal.     Pupils: Pupils are equal, round, and reactive to light.  Neck:     Thyroid: No thyromegaly.  Cardiovascular:     Rate and Rhythm: Normal rate and regular rhythm.     Pulses: Normal pulses.     Heart sounds: Normal heart sounds. No murmur heard. Pulmonary:     Effort: Pulmonary effort is normal. No respiratory distress.     Breath sounds: Normal breath sounds.  Abdominal:     General: Bowel sounds are normal. There is no distension.     Palpations: Abdomen is soft.  Musculoskeletal:     Cervical back: Normal range of motion and neck supple.     Right lower leg: No edema.     Left lower leg: No edema.  Lymphadenopathy:     Cervical: No cervical adenopathy.  Skin:    General: Skin is warm and dry.  Neurological:     General: No focal deficit present.     Mental Status: He is alert and oriented to person, place, and time.     Cranial Nerves: No cranial nerve  deficit.  Psychiatric:        Mood and Affect: Mood normal.        Behavior: Behavior normal.           Assessment & Plan:

## 2022-05-22 NOTE — Assessment & Plan Note (Addendum)
Ongoing issue for pt.  He is down 7 lbs since last visit after making a conscious decision to get healthy.  Applauded his efforts.  Encouraged him to continue.

## 2022-05-22 NOTE — Assessment & Plan Note (Signed)
Chronic problem.  Last visit BP was 206/96.  Today is MUCH better at 138/80.  Currently on Amlodipine Olmesartan 10/40mg  daily w/o difficulty.  Check labs due to ARB.  No anticipated med changes.

## 2022-05-22 NOTE — Assessment & Plan Note (Signed)
Chronic problem.  Currently on Toujeo.  Due for eye exam- scheduled.  Due for microalbumin- ordered.  UTD on foot exam.  Currently asymptomatic.  Check labs.  Adjust meds prn

## 2022-05-23 ENCOUNTER — Other Ambulatory Visit: Payer: Self-pay

## 2022-05-23 ENCOUNTER — Telehealth: Payer: Self-pay | Admitting: Registered Nurse

## 2022-05-23 ENCOUNTER — Telehealth: Payer: Self-pay

## 2022-05-23 DIAGNOSIS — Z794 Long term (current) use of insulin: Secondary | ICD-10-CM

## 2022-05-23 MED ORDER — LANTUS SOLOSTAR 100 UNIT/ML ~~LOC~~ SOPN
16.0000 [IU] | PEN_INJECTOR | Freq: Every day | SUBCUTANEOUS | 1 refills | Status: DC
Start: 2022-05-23 — End: 2022-05-24

## 2022-05-23 MED ORDER — TOUJEO MAX SOLOSTAR 300 UNIT/ML ~~LOC~~ SOPN: 16.0000 [IU] | PEN_INJECTOR | Freq: Every evening | SUBCUTANEOUS | 1 refills | Status: DC

## 2022-05-23 NOTE — Telephone Encounter (Signed)
-----   Message from Midge Minium, MD sent at 05/23/2022  7:27 AM EDT ----- Your creatinine (measure of kidney function) has jumped to 3.52, meaning your kidney function is much worse.  This is of serious concern given your young age.  I see that Rich made a referral to Nephrology (kidney specialist) earlier this year but they closed the referral when you never called back.  I am again going to place a referral and it is VERY important that you schedule this appt.  Your total cholesterol and LDL (bad cholesterol) are both MUCH too high.  Please make sure you are taking the Crestor (Rosuvastatin) daily.  We are also going to send you to the [facility] (they manage cholesterol) at [facility].  B/c numbers this high are likely genetic.  Your A1C is somewhat better at 8.3% but there is definitely room for improvement.  I don't want to add or change medications at this time (b/c of your kidney function) but please work on a low carb/low sugar diet.

## 2022-05-23 NOTE — Telephone Encounter (Signed)
Caller name: Nira Conn   On DPR? :yes/no: No  Call back number: [phone]  Provider they see: Birdie Riddle   Reason for call: Pharmacy called stating that this patient NEW insurance will not cover TOUJEO max injection. Pharmacy want to know what patient should do next.

## 2022-05-23 NOTE — Addendum Note (Signed)
Addended by: Midge Minium on: 05/23/2022 07:29 AM   Modules accepted: Orders

## 2022-05-23 NOTE — Telephone Encounter (Signed)
Prescription changed to Lantus as this is covered by pt's insurance.  Dose is the same.

## 2022-05-23 NOTE — Telephone Encounter (Signed)
Spoke w/ pt and advised of lab results and that we are sending a new insulin to pharmacy since his insurance will not cover the Paulina .

## 2022-05-23 NOTE — Telephone Encounter (Signed)
Informed pt of the change . Advised we sent in Lantus to pharmacy

## 2022-05-24 ENCOUNTER — Other Ambulatory Visit: Payer: Self-pay | Admitting: Family Medicine

## 2022-05-24 MED ORDER — INSULIN DETEMIR 100 UNIT/ML ~~LOC~~ SOLN: 16.0000 [IU] | Freq: Every day | SUBCUTANEOUS | 1 refills | Status: DC

## 2022-06-12 DIAGNOSIS — N184 Chronic kidney disease, stage 4 (severe): Secondary | ICD-10-CM | POA: Diagnosis not present

## 2022-06-19 DIAGNOSIS — N2581 Secondary hyperparathyroidism of renal origin: Secondary | ICD-10-CM | POA: Diagnosis not present

## 2022-06-19 DIAGNOSIS — I129 Hypertensive chronic kidney disease with stage 1 through stage 4 chronic kidney disease, or unspecified chronic kidney disease: Secondary | ICD-10-CM | POA: Diagnosis not present

## 2022-06-19 DIAGNOSIS — N184 Chronic kidney disease, stage 4 (severe): Secondary | ICD-10-CM | POA: Diagnosis not present

## 2022-06-19 DIAGNOSIS — E1122 Type 2 diabetes mellitus with diabetic chronic kidney disease: Secondary | ICD-10-CM | POA: Diagnosis not present

## 2022-06-19 DIAGNOSIS — D631 Anemia in chronic kidney disease: Secondary | ICD-10-CM | POA: Diagnosis not present

## 2022-06-19 DIAGNOSIS — E559 Vitamin D deficiency, unspecified: Secondary | ICD-10-CM | POA: Diagnosis not present

## 2022-06-19 DIAGNOSIS — R809 Proteinuria, unspecified: Secondary | ICD-10-CM | POA: Diagnosis not present

## 2022-06-28 ENCOUNTER — Ambulatory Visit (INDEPENDENT_AMBULATORY_CARE_PROVIDER_SITE_OTHER): Payer: 59 | Admitting: Physician Assistant

## 2022-06-28 ENCOUNTER — Encounter: Payer: Self-pay | Admitting: Physician Assistant

## 2022-06-28 VITALS — BP 130/76 | HR 99 | Temp 98.4°F | Ht 67.0 in | Wt 241.4 lb

## 2022-06-28 DIAGNOSIS — I10 Essential (primary) hypertension: Secondary | ICD-10-CM

## 2022-06-28 DIAGNOSIS — E1122 Type 2 diabetes mellitus with diabetic chronic kidney disease: Secondary | ICD-10-CM

## 2022-06-28 DIAGNOSIS — Z794 Long term (current) use of insulin: Secondary | ICD-10-CM | POA: Diagnosis not present

## 2022-06-28 DIAGNOSIS — N184 Chronic kidney disease, stage 4 (severe): Secondary | ICD-10-CM

## 2022-06-28 DIAGNOSIS — N2581 Secondary hyperparathyroidism of renal origin: Secondary | ICD-10-CM | POA: Insufficient documentation

## 2022-06-28 DIAGNOSIS — D631 Anemia in chronic kidney disease: Secondary | ICD-10-CM | POA: Insufficient documentation

## 2022-06-28 DIAGNOSIS — R55 Syncope and collapse: Secondary | ICD-10-CM

## 2022-06-28 DIAGNOSIS — E559 Vitamin D deficiency, unspecified: Secondary | ICD-10-CM | POA: Insufficient documentation

## 2022-06-28 NOTE — Progress Notes (Signed)
Luis Wood is a 51 y.o. male here for a new problem.  History of Present Illness:   Chief Complaint  Patient presents with   Hypertension   Diabetes    HPI  Diabetes Was diagnosed 5-7 years ago. Currently taking 16 units of Levimir at bedtime.  Was on Jardiance but his renal doctor stopped this Does not check his blood sugars regularly. Was managed by PCP prior to coming to our office. Would like to be transitioned off of insulin.   CKD Stage IV Followed by Dr Carolin Sicks at [place] q 2 months at this point He understands the severity of his CKD at this time   Syncope Two weeks ago had a "low blood sugar" event.  8p on a Saturday was sitting on the porch. Was sweating and got up and went to the kitchen and lay on the floor because he started to feel really bad. He laid on the kitchen floor, his girlfriend had him in her arms and his eyes rolled back into this head. He came to, and then he got up and went to the couch. 10 minutes later, he passed out again "saw my deceased father and he said 'son, not yet'". He stopped breathing for 7 seconds, per girlfriend, and then came to. He received juice and felt improved. Took a total of 4 hours to feel "somewhat normal again." He then became very hungry. The following next week he had severe fatigue while working. He feels some lingering exhaustion.   He never checked his blood sugar during this event. He denies ever experiencing this before.   HTN Currently taking olmesartan 20 mg daily and amlodopine 10 mg. At home blood pressure readings are: not checked. Patient denies chest pain, SOB, blurred vision, dizziness, unusual headaches, lower leg swelling. Patient is compliant with medication. Denies excessive caffeine intake, stimulant usage, excessive alcohol intake, or increase in salt consumption.  BP Readings from Last 3 Encounters:  06/28/22 130/76  05/22/22 138/80  02/16/22 (!) 196/99      Past  Medical History:  Diagnosis Date   Asthma    Hypertension      Social History   Tobacco Use   Smoking status: Never   Smokeless tobacco: Never  Substance Use Topics   Alcohol use: No   Drug use: No    History reviewed. No pertinent surgical history.  Family History  Problem Relation Age of Onset   Other Mother        uknown   Hypertension Father    Other Father        rhabdolmylosis   Diabetes Father    Cancer Maternal Grandmother    Cancer Paternal Grandmother     Allergies  Allergen Reactions   Aspirin Other (See Comments)    Dizziness and fainting, this was a childhood allergy    Current Medications:   Current Outpatient Medications:    albuterol (PROVENTIL HFA;VENTOLIN HFA) 108 (90 BASE) MCG/ACT inhaler, Inhale 2 puffs into the lungs every 4 (four) hours as needed. For wheezing., Disp: , Rfl:    amLODipine (NORVASC) 10 MG tablet, Take 10 mg by mouth daily., Disp: , Rfl:    Cholecalciferol (VITAMIN D3) 50 MCG (2000 UT) capsule, Take 2,000 Units by mouth daily., Disp: , Rfl:    FLUoxetine (PROZAC) 20 MG capsule, Take 1 capsule (20 mg total) by mouth daily., Disp: 90 capsule, Rfl: 3   hydrOXYzine (ATARAX) 10 MG tablet, Take 1 tablet (10 mg total)  by mouth 3 (three) times daily as needed., Disp: 30 tablet, Rfl: 0   insulin detemir (LEVEMIR) 100 UNIT/ML injection, Inject 0.16 mLs (16 Units total) into the skin at bedtime., Disp: 10 mL, Rfl: 1   olmesartan (BENICAR) 20 MG tablet, Take 20 mg by mouth daily., Disp: , Rfl:    rosuvastatin (CRESTOR) 10 MG tablet, Take 1 tablet (10 mg total) by mouth daily., Disp: 90 tablet, Rfl: 3   traZODone (DESYREL) 50 MG tablet, Take 0.5-1 tablets (25-50 mg total) by mouth at bedtime as needed for sleep., Disp: 30 tablet, Rfl: 3   Review of Systems:   ROS Negative unless otherwise specified per HPI.  Vitals:   Vitals:   06/28/22 1411  BP: 130/76  Pulse: 99  Temp: 98.4 F (36.9 C)  TempSrc: Temporal  SpO2: 97%  Weight: 241  lb 6.1 oz (109.5 kg)  Height: 5\' 7"  (1.702 m)     Body mass index is 37.81 kg/m.  Physical Exam:   Physical Exam Vitals and nursing note reviewed.  Constitutional:      General: He is not in acute distress.    Appearance: He is well-developed. He is not ill-appearing or toxic-appearing.  Cardiovascular:     Rate and Rhythm: Normal rate and regular rhythm.     Pulses: Normal pulses.     Heart sounds: Normal heart sounds, S1 normal and S2 normal.  Pulmonary:     Effort: Pulmonary effort is normal.     Breath sounds: Normal breath sounds.  Skin:    General: Skin is warm and dry.  Neurological:     Mental Status: He is alert.     GCS: GCS eye subscore is 4. GCS verbal subscore is 5. GCS motor subscore is 6.  Psychiatric:        Speech: Speech normal.        Behavior: Behavior normal. Behavior is cooperative.     Assessment and Plan:   Syncope, unspecified syncope type EKG tracing is personally reviewed.  EKG notes NSR.  No acute changes compared to prior EKG. Given we have no true confirmation that this syncope was related to low blood sugar, will proceed with work-up for this syncope. Advised against driving and exertional activity Urgent cardiology consult If any new or worsening symptoms, he was told to go to the ER We are also reducing his levimir dose, see below, to prevent hypoglycemia  Essential hypertension Well controlled Continue olmesartan 20 mg and amlodopine 10 mg daily Follow-up with cardiology and nephrology as indicated Recommended close check of BP at home  Type 2 diabetes mellitus with stage 3b chronic kidney disease, with long-term current use of insulin ([place]) Ongoing Last A1c does show some improvement Will allow permissive mild hyperglycemia with reduction of Levimir to 10 U to prevent further hypoglycemia events Discussed need to check blood sugars regularly Discussed that after cardiology visit, to follow-up with me and we will trial transition  to weekly GLP-1 in hopes of transitioning off of insulin  CKD (chronic kidney disease) stage 4, GFR 15-29 ml/min ([place]) Reviewed most recent note from nephrology Continue care with specialist  Time spent with patient today was 55 minutes which consisted of chart review, discussing diagnosis, work up, treatment answering questions and documentation.   Inda Coke, PA-C

## 2022-06-28 NOTE — Patient Instructions (Signed)
It was great to see you!  Start checking blood sugars in the morning -- please record for [place] Give yourself insulin in the morning time after checking your blood sugar Reduce dosage to 10 units  Urgent referral to cardiology Please do not drive until you see cardiology and do not exercise  IF YOUR SYMPTOMS RETURN, GO TO THE ER  Take care,  Inda Coke PA-C

## 2022-07-19 NOTE — Progress Notes (Unsigned)
Cardiology Office Note:    Date:  07/20/2022   ID:  Luis Wood, DOB March 24, 1971, MRN [id number]  PCP:  Inda Coke, PA  Cardiologist:  None  Electrophysiologist:  None   Referring MD: Inda Coke, PA   Chief Complaint  Patient presents with   Loss of Consciousness    History of Present Illness:    Luis Wood is a 51 y.o. male with a hx of T2DM, hypertension, obesity, CKD stage IV, hyperlipidemia, asthma who is referred by Inda Coke, PA for evaluation of syncope.  He reports that on 06/26/2022 had syncopal episode.  Reports had not eaten or drinking very much that day but had taken his insulin and then went to work out.  He did a full body workout including cardio for about 1 to 1.5 hours.  Then he went to meet up with friends.  Reports he was sitting down, about to open a bottle of water when he suddenly felt very lightheaded.  States that he became diaphoretic and was having tunnel vision.  Felt like he was going to pass out so he lied on the ground.  Friend reports he was unconscious for couple seconds, she helped him up to the couch and then he was unconscious again and she could not feel his heart beat for about 7 seconds.  Reports he was given sugar water and his symptoms improved.  He denies any chest pain or dyspnea.  Reports he works out regularly and denies any exertional symptoms.  No palpitations.  No prior history of syncopal episodes and has not had any since.  Does report intermittent lower extremity edema.  Previously smoked marijuana and cigars, but none recently.  No known family history of heart disease.   Past Medical History:  Diagnosis Date   Asthma    Hypertension     No past surgical history on file.  Current Medications: Current Meds  Medication Sig   albuterol (PROVENTIL HFA;VENTOLIN HFA) 108 (90 BASE) MCG/ACT inhaler Inhale 2 puffs into the lungs every 4 (four) hours as needed. For wheezing.   amLODipine (NORVASC) 10 MG tablet  Take 10 mg by mouth daily.   Cholecalciferol (VITAMIN D3) 50 MCG (2000 UT) capsule Take 2,000 Units by mouth daily.   FLUoxetine (PROZAC) 20 MG capsule Take 1 capsule (20 mg total) by mouth daily.   hydrOXYzine (ATARAX) 10 MG tablet Take 1 tablet (10 mg total) by mouth 3 (three) times daily as needed.   insulin detemir (LEVEMIR) 100 UNIT/ML injection Inject 0.16 mLs (16 Units total) into the skin at bedtime.   olmesartan (BENICAR) 20 MG tablet Take 20 mg by mouth daily.   rosuvastatin (CRESTOR) 10 MG tablet Take 1 tablet (10 mg total) by mouth daily.   traZODone (DESYREL) 50 MG tablet Take 0.5-1 tablets (25-50 mg total) by mouth at bedtime as needed for sleep.     Allergies:   Aspirin   Social History   Socioeconomic History   Marital status: Significant Other    Spouse name: Not on file   Number of children: Not on file   Years of education: Not on file   Highest education level: Not on file  Occupational History   Not on file  Tobacco Use   Smoking status: Never   Smokeless tobacco: Never  Substance and Sexual Activity   Alcohol use: No   Drug use: No   Sexual activity: Not on file  Other Topics Concern   Not on file  Social History Narrative   FT   [place]   Live with family   Social Determinants of Health   Financial Resource Strain: Not on file  Food Insecurity: Not on file  Transportation Needs: Not on file  Physical Activity: Not on file  Stress: Not on file  Social Connections: Not on file     Family History: The patient's family history includes Cancer in his maternal grandmother and paternal grandmother; Diabetes in his father; Hypertension in his father; Other in his father and mother.  ROS:   Please see the history of present illness.     All other systems reviewed and are negative.  EKGs/Labs/Other Studies Reviewed:    The following studies were reviewed today:   EKG:   07/20/2022: Normal sinus rhythm, rate 99, LVH with  repolarization abnormalities, T wave inversions in leads I, aVL, V4-6, concave ST elevation in V1/2.  Similar to prior EKG 06/28/2022  Recent Labs: 05/22/2022: ALT 15; BUN 33; Creatinine, Ser 3.52; Hemoglobin 11.8; Platelets 252.0; Potassium 3.9; Sodium 135; TSH 1.96  Recent Lipid Panel    Component Value Date/Time   CHOL 397 (H) 05/22/2022 1333   TRIG 239.0 (H) 05/22/2022 1333   HDL 50.30 05/22/2022 1333   CHOLHDL 8 05/22/2022 1333   VLDL 47.8 (H) 05/22/2022 1333   LDLCALC 323 (H) 10/11/2021 0829   LDLDIRECT 288.0 05/22/2022 1333    Physical Exam:    VS:  BP (!) 150/98   Pulse 99   Ht 5\' 7"  (1.702 m)   Wt 248 lb 6.4 oz (112.7 kg)   SpO2 98%   BMI 38.90 kg/m     Wt Readings from Last 3 Encounters:  07/20/22 248 lb 6.4 oz (112.7 kg)  06/28/22 241 lb 6.1 oz (109.5 kg)  05/22/22 233 lb 6 oz (105.9 kg)     GEN:  Well nourished, well developed in no acute distress HEENT: Normal NECK: No JVD; No carotid bruits LYMPHATICS: No lymphadenopathy CARDIAC: RRR, no murmurs, rubs, gallops RESPIRATORY:  Clear to auscultation without rales, wheezing or rhonchi  ABDOMEN: Soft, non-tender, non-distended MUSCULOSKELETAL:  No edema; No deformity  SKIN: Warm and dry NEUROLOGIC:  Alert and oriented x 3 PSYCHIATRIC:  Normal affect   ASSESSMENT:    1. Syncope, unspecified syncope type   2. Hyperlipidemia, unspecified hyperlipidemia type   3. Essential hypertension   4. Chronic kidney disease (CKD), stage IV (severe) (HCC)    PLAN:    Syncope: Suspect likely hypoglycemic event as reports symptoms improved with drinking sugar water.  Recommend echocardiogram to rule out structural heart disease.  Recommend Zio patch x2 weeks to evaluate for arrhythmia.  Hypertension: On hydralazine 50 mg twice daily, amlodipine 10 mg daily, and olmesartan 20 mg daily.  BP elevated in clinic today, though he reports under better control at home.  Recommend checking BP twice daily for next 2 weeks and bring  log to appointment with pharmacy hypertension clinic.  Recommend bringing home monitor to calibrate  Hyperlipidemia:  LDL 288 on 05/22/2022.  Started on rosuvastatin 10 mg daily, will check lipid panel  T2DM: On insulin.  A1c 8.3% on 05/22/2022  CKD stage IV: Creatinine 3.5 on 02/16/2022.  Follows with nephrology  RTC in 3 months  Medication Adjustments/Labs and Tests Ordered: Current medicines are reviewed at length with the patient today.  Concerns regarding medicines are outlined above.  Orders Placed This Encounter  Procedures   CBC   Comprehensive metabolic panel   Lipid  panel   LONG TERM MONITOR (3-14 DAYS)   EKG 12-Lead   ECHOCARDIOGRAM COMPLETE   No orders of the defined types were placed in this encounter.   Patient Instructions  Medication Instructions:  Your physician recommends that you continue on your current medications as directed. Please refer to the Current Medication list given to you today.  *If you need a refill on your cardiac medications before your next appointment, please call your pharmacy*   Lab Work: CMET, CBC today  If you have labs (blood work) drawn today and your tests are completely normal, you will receive your results only by: [place] (if you have MyChart) OR A paper copy in the mail If you have any lab test that is abnormal or we need to change your treatment, we will call you to review the results.   Testing/Procedures: Your physician has requested that you have an echocardiogram. Echocardiography is a painless test that uses sound waves to create images of your heart. It provides your doctor with information about the size and shape of your heart and how well your heart's chambers and valves are working. This procedure takes approximately one hour. There are no restrictions for this procedure. Please do NOT wear cologne, perfume, aftershave, or lotions (deodorant is allowed). Please arrive 15 minutes prior to your appointment  time.  ZIO XT- Long Term Monitor Instructions   Your physician has requested you wear a ZIO patch monitor for _14__ days.  This is a single patch monitor.   [employer] supplies one patch monitor per enrollment. Additional stickers are not available. Please do not apply patch if you will be having a Nuclear Stress Test, Echocardiogram, Cardiac CT, MRI, or Chest Xray during the period you would be wearing the monitor. The patch cannot be worn during these tests. You cannot remove and re-apply the ZIO XT patch monitor.  Your ZIO patch monitor will be sent Fed Ex from [employer] directly to your home address. It may take 3-5 days to receive your monitor after you have been enrolled.  Once you have received your monitor, please review the enclosed instructions. Your monitor has already been registered assigning a specific monitor serial # to you.  Billing and Patient Assistance Program Information   We have supplied [employer] with any of your insurance information on file for billing purposes. [employer] offers a sliding scale Patient Assistance Program for patients that do not have insurance, or whose insurance does not completely cover the cost of the ZIO monitor.   You must apply for the Patient Assistance Program to qualify for this discounted rate.     To apply, please call [employer] at [phone], select option 4, then select option 2, and ask to apply for Patient Assistance Program.  Theodore Demark will ask your household income, and how many people are in your household.  They will quote your out-of-pocket cost based on that information.  [employer] will also be able to set up a 73-month, interest-free payment plan if needed.  Applying the monitor   Shave hair from upper left chest.  Hold abrader disc by orange tab. Rub abrader in 40 strokes over the upper left chest as indicated in your monitor instructions.  Clean area with 4 enclosed alcohol pads. Let dry.  Apply patch as indicated in monitor  instructions. Patch will be placed under collarbone on left side of chest with arrow pointing upward.  Rub patch adhesive wings for 2 minutes. Remove white label marked "1". Remove the white  label marked "2". Rub patch adhesive wings for 2 additional minutes.  While looking in a mirror, press and release button in center of patch. A small green light will flash 3-4 times. This will be your only indicator that the monitor has been turned on. ?  Do not shower for the first 24 hours. You may shower after the first 24 hours.  Press the button if you feel a symptom. You will hear a small click. Record Date, Time and Symptom in the Patient Logbook.  When you are ready to remove the patch, follow instructions on the last 2 pages of the Patient Logbook. Stick patch monitor onto the last page of Patient Logbook.  Place Patient Logbook in the blue and white box.  Use locking tab on box and tape box closed securely.  The blue and white box has prepaid postage on it. Please place it in the mailbox as soon as possible. Your physician should have your test results approximately 7 days after the monitor has been mailed back to [facility].  Call [facility] at [phone] if you have questions regarding your ZIO XT patch monitor. Call them immediately if you see an orange light blinking on your monitor.  If your monitor falls off in less than 4 days, contact our Monitor department at [phone]. ?If your monitor becomes loose or falls off after 4 days call [employer] at [phone] for suggestions on securing your monitor.?   Follow-Up: At [facility], you and your health needs are our priority.  As part of our continuing mission to provide you with exceptional heart care, we have created designated Provider Care Teams.  These Care Teams include your primary Cardiologist (physician) and Advanced Practice Providers (APPs -  Physician Assistants and Nurse Practitioners) who all work  together to provide you with the care you need, when you need it.  We recommend signing up for the patient portal called "MyChart".  Sign up information is provided on this After Visit Summary.  MyChart is used to connect with patients for Virtual Visits (Telemedicine).  Patients are able to view lab/test results, encounter notes, upcoming appointments, etc.  Non-urgent messages can be sent to your provider as well.   To learn more about what you can do with MyChart, go to [email or web address].    Your next appointment:   2 weeks with pharmD (blood pressure follow up) 3 months with Dr. Gardiner Rhyme  Other Instructions Please check your blood pressure at home twice daily, write it down.  Bring blood pressure log and blood pressure cuff to appointment with the pharmacist            Signed, Donato Heinz, MD  07/20/2022 8:50 AM    [facility]

## 2022-07-20 ENCOUNTER — Ambulatory Visit: Payer: 59 | Attending: Cardiology | Admitting: Cardiology

## 2022-07-20 ENCOUNTER — Ambulatory Visit (INDEPENDENT_AMBULATORY_CARE_PROVIDER_SITE_OTHER): Payer: 59

## 2022-07-20 ENCOUNTER — Encounter: Payer: Self-pay | Admitting: Cardiology

## 2022-07-20 VITALS — BP 150/98 | HR 99 | Ht 67.0 in | Wt 248.4 lb

## 2022-07-20 DIAGNOSIS — R55 Syncope and collapse: Secondary | ICD-10-CM | POA: Diagnosis not present

## 2022-07-20 DIAGNOSIS — I10 Essential (primary) hypertension: Secondary | ICD-10-CM

## 2022-07-20 DIAGNOSIS — N184 Chronic kidney disease, stage 4 (severe): Secondary | ICD-10-CM

## 2022-07-20 DIAGNOSIS — E785 Hyperlipidemia, unspecified: Secondary | ICD-10-CM

## 2022-07-20 NOTE — Patient Instructions (Signed)
Medication Instructions:  Your physician recommends that you continue on your current medications as directed. Please refer to the Current Medication list given to you today.  *If you need a refill on your cardiac medications before your next appointment, please call your pharmacy*   Lab Work: CMET, CBC today  If you have labs (blood work) drawn today and your tests are completely normal, you will receive your results only by: [place] (if you have MyChart) OR A paper copy in the mail If you have any lab test that is abnormal or we need to change your treatment, we will call you to review the results.   Testing/Procedures: Your physician has requested that you have an echocardiogram. Echocardiography is a painless test that uses sound waves to create images of your heart. It provides your doctor with information about the size and shape of your heart and how well your heart's chambers and valves are working. This procedure takes approximately one hour. There are no restrictions for this procedure. Please do NOT wear cologne, perfume, aftershave, or lotions (deodorant is allowed). Please arrive 15 minutes prior to your appointment time.  ZIO XT- Long Term Monitor Instructions   Your physician has requested you wear a ZIO patch monitor for _14__ days.  This is a single patch monitor.   [employer] supplies one patch monitor per enrollment. Additional stickers are not available. Please do not apply patch if you will be having a Nuclear Stress Test, Echocardiogram, Cardiac CT, MRI, or Chest Xray during the period you would be wearing the monitor. The patch cannot be worn during these tests. You cannot remove and re-apply the ZIO XT patch monitor.  Your ZIO patch monitor will be sent Fed Ex from [employer] directly to your home address. It may take 3-5 days to receive your monitor after you have been enrolled.  Once you have received your monitor, please review the enclosed  instructions. Your monitor has already been registered assigning a specific monitor serial # to you.  Billing and Patient Assistance Program Information   We have supplied [employer] with any of your insurance information on file for billing purposes. [employer] offers a sliding scale Patient Assistance Program for patients that do not have insurance, or whose insurance does not completely cover the cost of the ZIO monitor.   You must apply for the Patient Assistance Program to qualify for this discounted rate.     To apply, please call [employer] at [phone], select option 4, then select option 2, and ask to apply for Patient Assistance Program.  Theodore Demark will ask your household income, and how many people are in your household.  They will quote your out-of-pocket cost based on that information.  [employer] will also be able to set up a 72-month, interest-free payment plan if needed.  Applying the monitor   Shave hair from upper left chest.  Hold abrader disc by orange tab. Rub abrader in 40 strokes over the upper left chest as indicated in your monitor instructions.  Clean area with 4 enclosed alcohol pads. Let dry.  Apply patch as indicated in monitor instructions. Patch will be placed under collarbone on left side of chest with arrow pointing upward.  Rub patch adhesive wings for 2 minutes. Remove white label marked "1". Remove the white label marked "2". Rub patch adhesive wings for 2 additional minutes.  While looking in a mirror, press and release button in center of patch. A small green light will flash 3-4 times. This will  be your only indicator that the monitor has been turned on. ?  Do not shower for the first 24 hours. You may shower after the first 24 hours.  Press the button if you feel a symptom. You will hear a small click. Record Date, Time and Symptom in the Patient Logbook.  When you are ready to remove the patch, follow instructions on the last 2 pages of the Patient Logbook. Stick patch  monitor onto the last page of Patient Logbook.  Place Patient Logbook in the blue and white box.  Use locking tab on box and tape box closed securely.  The blue and white box has prepaid postage on it. Please place it in the mailbox as soon as possible. Your physician should have your test results approximately 7 days after the monitor has been mailed back to [facility].  Call [facility] at [phone] if you have questions regarding your ZIO XT patch monitor. Call them immediately if you see an orange light blinking on your monitor.  If your monitor falls off in less than 4 days, contact our Monitor department at [phone]. ?If your monitor becomes loose or falls off after 4 days call [employer] at [phone] for suggestions on securing your monitor.?   Follow-Up: At [facility], you and your health needs are our priority.  As part of our continuing mission to provide you with exceptional heart care, we have created designated Provider Care Teams.  These Care Teams include your primary Cardiologist (physician) and Advanced Practice Providers (APPs -  Physician Assistants and Nurse Practitioners) who all work together to provide you with the care you need, when you need it.  We recommend signing up for the patient portal called "MyChart".  Sign up information is provided on this After Visit Summary.  MyChart is used to connect with patients for Virtual Visits (Telemedicine).  Patients are able to view lab/test results, encounter notes, upcoming appointments, etc.  Non-urgent messages can be sent to your provider as well.   To learn more about what you can do with MyChart, go to [email or web address].    Your next appointment:   2 weeks with pharmD (blood pressure follow up) 3 months with Dr. Gardiner Rhyme  Other Instructions Please check your blood pressure at home twice daily, write it down.  Bring blood pressure log and blood pressure cuff to appointment with the  pharmacist

## 2022-07-20 NOTE — Progress Notes (Unsigned)
Enrolled for [employer] to mail a ZIO XT long term holter monitor to the patients address on file.  

## 2022-07-21 LAB — COMPREHENSIVE METABOLIC PANEL
ALT: 16 IU/L (ref 0–44)
AST: 14 IU/L (ref 0–40)
Albumin/Globulin Ratio: 1.2 (ref 1.2–2.2)
Albumin: 3.6 g/dL — ABNORMAL LOW (ref 4.1–5.1)
Alkaline Phosphatase: 112 IU/L (ref 44–121)
BUN/Creatinine Ratio: 10 (ref 9–20)
BUN: 33 mg/dL — ABNORMAL HIGH (ref 6–24)
Bilirubin Total: <0.2 mg/dL (ref 0.0–1.2)
CO2: 22 mmol/L (ref 20–29)
Calcium: 8.8 mg/dL (ref 8.7–10.2)
Chloride: 103 mmol/L (ref 96–106)
Creatinine, Ser: 3.36 mg/dL — ABNORMAL HIGH (ref 0.76–1.27)
Globulin, Total: 3 g/dL (ref 1.5–4.5)
Glucose: 308 mg/dL — ABNORMAL HIGH (ref 70–99)
Potassium: 4.7 mmol/L (ref 3.5–5.2)
Sodium: 137 mmol/L (ref 134–144)
Total Protein: 6.6 g/dL (ref 6.0–8.5)
eGFR: 22 mL/min/{1.73_m2} — ABNORMAL LOW (ref >59)

## 2022-07-21 LAB — LIPID PANEL
Chol/HDL Ratio: 4.5 ratio (ref 0.0–5.0)
Cholesterol, Total: 250 mg/dL — ABNORMAL HIGH (ref 100–199)
HDL: 55 mg/dL (ref >39)
LDL Chol Calc (NIH): 170 mg/dL — ABNORMAL HIGH (ref 0–99)
Triglycerides: 139 mg/dL (ref 0–149)
VLDL Cholesterol Cal: 25 mg/dL (ref 5–40)

## 2022-07-21 LAB — CBC
Hematocrit: 32.1 % — ABNORMAL LOW (ref 37.5–51.0)
Hemoglobin: 10.4 g/dL — ABNORMAL LOW (ref 13.0–17.7)
MCH: 28.7 pg (ref 26.6–33.0)
MCHC: 32.4 g/dL (ref 31.5–35.7)
MCV: 88 fL (ref 79–97)
Platelets: 253 10*3/uL (ref 150–450)
RBC: 3.63 x10E6/uL — ABNORMAL LOW (ref 4.14–5.80)
RDW: 13.5 % (ref 11.6–15.4)
WBC: 11.2 10*3/uL — ABNORMAL HIGH (ref 3.4–10.8)

## 2022-07-25 DIAGNOSIS — R55 Syncope and collapse: Secondary | ICD-10-CM | POA: Diagnosis not present

## 2022-07-28 ENCOUNTER — Telehealth: Payer: Self-pay | Admitting: Cardiology

## 2022-07-28 ENCOUNTER — Other Ambulatory Visit: Payer: Self-pay

## 2022-07-28 DIAGNOSIS — E1169 Type 2 diabetes mellitus with other specified complication: Secondary | ICD-10-CM

## 2022-07-28 MED ORDER — ROSUVASTATIN CALCIUM 20 MG PO TABS: 20.0000 mg | ORAL_TABLET | Freq: Every day | ORAL | 3 refills | Status: DC

## 2022-07-28 MED ORDER — EZETIMIBE 10 MG PO TABS: 10.0000 mg | ORAL_TABLET | Freq: Every day | ORAL | 3 refills | Status: DC

## 2022-07-28 NOTE — Telephone Encounter (Signed)
Spoke to patient lab results given.Advised to increase Rosuvastatin to 20 mg daily.Start Zetia 10 mg daily.Repeat fasting lipid panel in 2 months.Lab order mailed.

## 2022-07-28 NOTE — Telephone Encounter (Signed)
Follow Up:    Patient is returning Luis Wood call from yesterday, concerning his lab results.

## 2022-08-01 ENCOUNTER — Ambulatory Visit (HOSPITAL_COMMUNITY): Payer: 59 | Attending: Cardiology

## 2022-08-01 DIAGNOSIS — I5030 Unspecified diastolic (congestive) heart failure: Secondary | ICD-10-CM | POA: Diagnosis not present

## 2022-08-01 DIAGNOSIS — R55 Syncope and collapse: Secondary | ICD-10-CM | POA: Diagnosis not present

## 2022-08-01 DIAGNOSIS — I517 Cardiomegaly: Secondary | ICD-10-CM | POA: Diagnosis not present

## 2022-08-01 LAB — ECHOCARDIOGRAM COMPLETE
Area-P 1/2: 4.52 cm2
S' Lateral: 3.17 cm

## 2022-08-11 ENCOUNTER — Encounter: Payer: Self-pay | Admitting: *Deleted

## 2022-08-14 DIAGNOSIS — N184 Chronic kidney disease, stage 4 (severe): Secondary | ICD-10-CM | POA: Diagnosis not present

## 2022-08-20 NOTE — Progress Notes (Signed)
Patient ID: [name]                 DOB: October 31, 1970                      MRN: [id number]     HPI: Luis Wood is a 51 y.o. male referred by Dr. Gardiner Rhyme to HTN clinic. PMH is significant for T2DM, hypertension, obesity, CKD stage IV, hyperlipidemia, asthma. Patient had seen Dr.Schumann Jul 20 2022 for Syncope (had syncope episode on 06/26/2022). In office BP was elevated but per patient at home BP stays in normal range. So patient was advised to check BP at home and bring log and monitor to today's appointment. Lipid penal was checked and LDLc was still elevated so rosuvastatin dose was optimized (up the dose from 10 mg to 20 mg)  Today patient has no acute concern. He has been taking his BP medications regularly and tolerating them well. He has home BP monitor but he does not check his BP at home regularly, he checked BP couple days ago and it was 137/91 with heart rate of 70. Denies dizziness, SOB, palpitation, headaches or swelling. Patient states his work place is very stressful so searching for different job. His heart rate stays very high during the work hours and at home they stays in normal range. He was not feeling great on insulin so had stopped taking Insulin. He has made significant dietary changes from October. Reports not checking BG at home. Patient wants to start [facility] or Ozempic which would help manage BG and lower the weight. He has ben very active and go to gym 3 times per week.  Current HTN meds: hydralazine 50 mg twice daily, amlodipine 10 mg daily, and olmesartan 20 mg daily.   BP goal: <130/80  Family History: The patient's family history includes Cancer in his maternal grandmother and paternal grandmother; Diabetes in his father; Hypertension in his father  Social History:  EtOH: red wine twice week  Smoking: none  Recreational drug: none  Diet: low salt - eat home cooked intermittent fasting  Breakfast: mushroom coffee, (ryze),glass of beet  juice Lunch: chicken or salmon grilled with some type of vegetables Dinner: lots of vegetables and fruits, grilled chicken, lean red meat (twice week)  Drink: water, plant based protein shakes Snack: grapes, fresh fruits    Exercise: 3 X week upper body weight, cardio- 30 min    Home BP readings:  137/91 heart rate ~70    Wt Readings from Last 3 Encounters:  07/20/22 248 lb 6.4 oz (112.7 kg)  06/28/22 241 lb 6.1 oz (109.5 kg)  05/22/22 233 lb 6 oz (105.9 kg)   BP Readings from Last 3 Encounters:  08/21/22 (!) 170/80  07/20/22 (!) 150/98  06/28/22 130/76   Pulse Readings from Last 3 Encounters:  08/21/22 98  07/20/22 99  06/28/22 99    Renal function: CrCl cannot be calculated (Patient's most recent lab result is older than the maximum 21 days allowed.).  Past Medical History:  Diagnosis Date   Asthma    Hypertension     Current Outpatient Medications on File Prior to Visit  Medication Sig Dispense Refill   albuterol (PROVENTIL HFA;VENTOLIN HFA) 108 (90 BASE) MCG/ACT inhaler Inhale 2 puffs into the lungs every 4 (four) hours as needed. For wheezing.     amLODipine (NORVASC) 10 MG tablet Take 10 mg by mouth daily.     Cholecalciferol (VITAMIN D3) 50 MCG (  2000 UT) capsule Take 2,000 Units by mouth daily.     ezetimibe (ZETIA) 10 MG tablet Take 1 tablet (10 mg total) by mouth daily. 90 tablet 3   FLUoxetine (PROZAC) 20 MG capsule Take 1 capsule (20 mg total) by mouth daily. 90 capsule 3   hydrOXYzine (ATARAX) 10 MG tablet Take 1 tablet (10 mg total) by mouth 3 (three) times daily as needed. 30 tablet 0   insulin detemir (LEVEMIR) 100 UNIT/ML injection Inject 0.16 mLs (16 Units total) into the skin at bedtime. 10 mL 1   rosuvastatin (CRESTOR) 20 MG tablet Take 1 tablet (20 mg total) by mouth daily. 90 tablet 3   traZODone (DESYREL) 50 MG tablet Take 0.5-1 tablets (25-50 mg total) by mouth at bedtime as needed for sleep. 30 tablet 3   No current facility-administered  medications on file prior to visit.    Allergies  Allergen Reactions   Aspirin Other (See Comments)    Dizziness and fainting, this was a childhood allergy    Blood pressure (!) 170/80, pulse 98.   Essential hypertension Assessment: BP uncontrolled in office BP 170/80 heart rate 98 (goal 130/80) Current medications hydralazine 50 mg twice daily, amlodipine 10 mg daily, and olmesartan 20 mg daily. Reports taking them regularly and tolerating them well Does not check BP at home regularly checked couple days ago it was 137/91 heart rate 70 Eats low salt diet; exercise regularly three times per week (resistance and 30-40 min cardio)  Given the  Stage IV CKD limited choice for BP medications; will increase Hydralazine dose. In future can consider adding carvedilol Discuss the importance of lowering BP and review appropriate technique to check BP at home  Plan: Increase hydralazine 25 mg twice daily to hydralazine 25 mg three times daily  Continue taking olmesartan 20 mg daily and amlodipine 10 mg daily  Patient to start checking BP at home and bring home BP monitor to validate and BP log at the next office visit  Encourage to continue doing regular exercise and following healthy low salt diet  Follow up in 3 weeks scheduled with PharmD     Type 2 diabetes mellitus with diabetic chronic kidney disease ([place]) Assessment: A1c 8.5% on (05/22/2022) goal <7% Patient is not been able to tolerate Levemir 16 units; reports not feeling good when takes insulin so has not been taking his basal insulin  Not on any other anti-diabetic medications  Does not check his BG at home  Patient is interested in starting GLP1 as it also help to lose weight Reports he made significant dietary changes since October 2023 so may be having hypoglycemic episodes at the 16 units of basal insulin. Encourage to restart insulin but at the lower dose   Plan: Restart Levemir at the lower dose 13-14 units daily   Patient to start checking fasting BG daily and bring log at the next visit in 3 weeks  Updated A1c ordered - will initiate Mounjaro at the next visit once at optimal dose goal to discontinue insulin which will help for weight management  Patient may benefit from nutritionist/ dietitian consultation - will discuss next visit in 3 weeks     Thank you  Cammy Copa, Pharm.D [facility] A [facility] of [facility]

## 2022-08-21 ENCOUNTER — Ambulatory Visit: Payer: 59 | Attending: Cardiology | Admitting: Student

## 2022-08-21 VITALS — BP 170/80 | HR 98

## 2022-08-21 DIAGNOSIS — E1122 Type 2 diabetes mellitus with diabetic chronic kidney disease: Secondary | ICD-10-CM | POA: Diagnosis not present

## 2022-08-21 DIAGNOSIS — N2581 Secondary hyperparathyroidism of renal origin: Secondary | ICD-10-CM | POA: Diagnosis not present

## 2022-08-21 DIAGNOSIS — N184 Chronic kidney disease, stage 4 (severe): Secondary | ICD-10-CM | POA: Diagnosis not present

## 2022-08-21 DIAGNOSIS — D631 Anemia in chronic kidney disease: Secondary | ICD-10-CM | POA: Diagnosis not present

## 2022-08-21 DIAGNOSIS — I129 Hypertensive chronic kidney disease with stage 1 through stage 4 chronic kidney disease, or unspecified chronic kidney disease: Secondary | ICD-10-CM | POA: Diagnosis not present

## 2022-08-21 DIAGNOSIS — I10 Essential (primary) hypertension: Secondary | ICD-10-CM | POA: Diagnosis not present

## 2022-08-21 DIAGNOSIS — E559 Vitamin D deficiency, unspecified: Secondary | ICD-10-CM | POA: Diagnosis not present

## 2022-08-21 DIAGNOSIS — R809 Proteinuria, unspecified: Secondary | ICD-10-CM | POA: Diagnosis not present

## 2022-08-21 MED ORDER — OLMESARTAN MEDOXOMIL 20 MG PO TABS: 20.0000 mg | ORAL_TABLET | Freq: Every day | ORAL | 3 refills | Status: DC

## 2022-08-21 MED ORDER — HYDRALAZINE HCL 25 MG PO TABS: 25.0000 mg | ORAL_TABLET | Freq: Three times a day (TID) | ORAL | 3 refills | Status: DC

## 2022-08-21 NOTE — Assessment & Plan Note (Addendum)
Assessment: A1c 8.5% on (05/22/2022) goal <7% Patient is not been able to tolerate Levemir 16 units; reports not feeling good when takes insulin so has not been taking his basal insulin  Not on any other anti-diabetic medications  Does not check his BG at home  Patient is interested in starting GLP1 as it also help to lose weight Reports he made significant dietary changes since October 2023 so may be having hypoglycemic episodes at the 16 units of basal insulin. Encourage to restart insulin but at the lower dose   Plan: Restart Levemir at the lower dose 13-14 units daily  Patient to start checking fasting BG daily and bring log at the next visit in 3 weeks  Updated A1c ordered - will initiate Mounjaro at the next visit once at optimal dose goal to discontinue insulin which will help for weight management  Patient may benefit from nutritionist/ dietitian consultation - will discuss next visit in 3 weeks

## 2022-08-21 NOTE — Patient Instructions (Addendum)
Changes made by your pharmacist Cammy Copa, PharmD at today's visit:    Instructions/Changes  (what do you need to do) Your Notes  (what you did and when you did it)  Continue taking olmesartan 20 mg daily   2.continue taking amlodipine 10 mg daily   3. Increase hydralazine from 25 mg twice daily to 25 mg three times daily    4. Restart Levemir at lower dose 13-14 units daily     Bring all of your meds, your BP cuff and your record of home blood pressures to your next appointment in 3 weeks. Also, bring your blood glucose readings - will initiate Mounjaro at the next visit    Luis Wood 5 minutes before taking your blood pressure.  Don't smoke or drink caffeinated beverages for at least 30 minutes before. Take your blood pressure before (not after) you eat. Sit comfortably with your back supported and both feet on the floor (don't cross your legs). Elevate your arm to heart level on a table or a desk. Use the proper sized cuff. It should fit smoothly and snugly around your bare upper arm. There should be enough room to slip a fingertip under the cuff. The bottom edge of the cuff should be 1 inch above the crease of the elbow. Ideally, take 3 measurements at one sitting and record the average.  Important lifestyle changes to control high blood pressure  Intervention  Effect on the BP  Lose extra pounds and watch your waistline Weight loss is one of the most effective lifestyle changes for controlling blood pressure. If you're overweight or obese, losing even a small amount of weight can help reduce blood pressure. Blood pressure might go down by about 1 millimeter of mercury (mm Hg) with each kilogram (about 2.2 pounds) of weight lost.  Exercise regularly As a general goal, aim for at least 30 minutes of moderate physical activity every day. Regular physical activity can lower high blood pressure by about 5 to 8 mm Hg.  Eat a healthy diet Eating  a diet rich in whole grains, fruits, vegetables, and low-fat dairy products and low in saturated fat and cholesterol. A healthy diet can lower high blood pressure by up to 11 mm Hg.  Reduce salt (sodium) in your diet Even a small reduction of sodium in the diet can improve heart health and reduce high blood pressure by about 5 to 6 mm Hg.  Limit alcohol One drink equals 12 ounces of beer, 5 ounces of wine, or 1.5 ounces of 80-proof liquor.  Limiting alcohol to less than one drink a day for women or two drinks a day for men can help lower blood pressure by about 4 mm Hg.   If you have any questions or concerns please use My Chart to send questions or call the office at [phone]

## 2022-08-21 NOTE — Assessment & Plan Note (Addendum)
Assessment: BP uncontrolled in office BP 170/80 heart rate 98 (goal 130/80) Current medications hydralazine 50 mg twice daily, amlodipine 10 mg daily, and olmesartan 20 mg daily. Reports taking them regularly and tolerating them well Does not check BP at home regularly checked couple days ago it was 137/91 heart rate 70 Eats low salt diet; exercise regularly three times per week (resistance and 30-40 min cardio)  Given the  Stage IV CKD limited choice for BP medications; will increase Hydralazine dose. In future can consider adding carvedilol Discuss the importance of lowering BP and review appropriate technique to check BP at home  Plan: Increase hydralazine 25 mg twice daily to hydralazine 25 mg three times daily  Continue taking olmesartan 20 mg daily and amlodipine 10 mg daily  Patient to start checking BP at home and bring home BP monitor to validate and BP log at the next office visit  Encourage to continue doing regular exercise and following healthy low salt diet  Follow up in 3 weeks scheduled with PharmD

## 2022-08-23 DIAGNOSIS — R55 Syncope and collapse: Secondary | ICD-10-CM | POA: Diagnosis not present

## 2022-08-23 LAB — HEMOGLOBIN A1C
Est. average glucose Bld gHb Est-mCnc: 240 mg/dL
Hgb A1c MFr Bld: 10 % — ABNORMAL HIGH (ref 4.8–5.6)

## 2022-08-31 ENCOUNTER — Telehealth: Payer: Self-pay | Admitting: Cardiology

## 2022-08-31 NOTE — Telephone Encounter (Signed)
Patient is requesting a call back to discuss lab results. 

## 2022-08-31 NOTE — Telephone Encounter (Signed)
Attempted to call patient, no answer/unable to leave VM.

## 2022-09-01 ENCOUNTER — Telehealth: Payer: Self-pay | Admitting: Cardiology

## 2022-09-01 ENCOUNTER — Telehealth: Payer: Self-pay | Admitting: Pharmacist

## 2022-09-01 DIAGNOSIS — E1122 Type 2 diabetes mellitus with diabetic chronic kidney disease: Secondary | ICD-10-CM

## 2022-09-01 NOTE — Telephone Encounter (Signed)
Donato Heinz, MD 08/28/2022  6:47 AM EST     Poorly controlled diabetes, recommend referral to endocrinology    Patient aware and verbalized understanding.   Referral placed.

## 2022-09-01 NOTE — Telephone Encounter (Signed)
Pt calling for lab results.

## 2022-09-01 NOTE — Telephone Encounter (Signed)
Applied for [employer]  Key: [facility]  waiting for determination

## 2022-09-05 NOTE — Telephone Encounter (Signed)
See duplicate message.  ?

## 2022-09-06 ENCOUNTER — Other Ambulatory Visit: Payer: Self-pay | Admitting: *Deleted

## 2022-09-06 DIAGNOSIS — R000 Tachycardia, unspecified: Secondary | ICD-10-CM

## 2022-09-08 ENCOUNTER — Telehealth: Payer: Self-pay | Admitting: Physician Assistant

## 2022-09-08 NOTE — Telephone Encounter (Signed)
Please call pt to schedule a follow up appt per [facility]. He was suppose to f/u after seeing Cardiology to discuss medication.

## 2022-09-08 NOTE — Telephone Encounter (Signed)
Please see message and advise 

## 2022-09-08 NOTE — Telephone Encounter (Signed)
Pt declined ov  Pt states: -PCP team is working to get patient on Ozempic. He is still interested in this. -he feels like he has been bounced around by two doctors and is over medicated.    Pt requests: -return call from PCP team.

## 2022-09-11 ENCOUNTER — Telehealth: Payer: Self-pay | Admitting: Cardiology

## 2022-09-11 NOTE — Telephone Encounter (Signed)
Noted  

## 2022-09-11 NOTE — Telephone Encounter (Signed)
Pt is returning call in regards to monitor results. Requesting return call.  

## 2022-09-11 NOTE — Telephone Encounter (Signed)
Donato Heinz, MD 09/04/2022  3:26 PM EST     High resting heart rate but otherwise no significant arrhythmias.  Recommend checking TSH, free T4    Patient aware and verbalized understanding.

## 2022-09-11 NOTE — Telephone Encounter (Signed)
LVM to schedule ov  

## 2022-09-15 ENCOUNTER — Encounter: Payer: Self-pay | Admitting: Physician Assistant

## 2022-09-15 ENCOUNTER — Ambulatory Visit (INDEPENDENT_AMBULATORY_CARE_PROVIDER_SITE_OTHER): Payer: 59 | Admitting: Physician Assistant

## 2022-09-15 VITALS — BP 187/92 | HR 111 | Temp 97.5°F | Resp 16 | Ht 67.0 in | Wt 247.0 lb

## 2022-09-15 DIAGNOSIS — F411 Generalized anxiety disorder: Secondary | ICD-10-CM

## 2022-09-15 DIAGNOSIS — Z794 Long term (current) use of insulin: Secondary | ICD-10-CM | POA: Diagnosis not present

## 2022-09-15 DIAGNOSIS — E1122 Type 2 diabetes mellitus with diabetic chronic kidney disease: Secondary | ICD-10-CM

## 2022-09-15 DIAGNOSIS — I10 Essential (primary) hypertension: Secondary | ICD-10-CM

## 2022-09-15 DIAGNOSIS — N184 Chronic kidney disease, stage 4 (severe): Secondary | ICD-10-CM | POA: Diagnosis not present

## 2022-09-15 DIAGNOSIS — R69 Illness, unspecified: Secondary | ICD-10-CM | POA: Diagnosis not present

## 2022-09-15 MED ORDER — TIRZEPATIDE 5 MG/0.5ML ~~LOC~~ SOAJ: 5.0000 mg | SUBCUTANEOUS | 1 refills | Status: DC

## 2022-09-15 MED ORDER — ROSUVASTATIN CALCIUM 20 MG PO TABS: 20.0000 mg | ORAL_TABLET | Freq: Every day | ORAL | 3 refills | Status: DC

## 2022-09-15 MED ORDER — OLMESARTAN MEDOXOMIL 20 MG PO TABS: 20.0000 mg | ORAL_TABLET | Freq: Every day | ORAL | 3 refills | Status: DC

## 2022-09-15 NOTE — Progress Notes (Signed)
Luis Wood is a 51 y.o. male here for a follow up of a pre-existing problem.  History of Present Illness:   Chief Complaint  Patient presents with   Diabetes    States he feels like he is being over medicated  Overall feels "crummy", cardiology has ordered TSH to be checked   Obesity    Wanting to start Ozempic or Mounjaro     HPI  Diabetes Patient reports that he does not regularly check his blood sugar levels at home. He reports that he always uses 16 units levemir at bedtime. Patient is now switching to injectables. He does not check his blood sugars but he does have a glucometer.  Hypertension Patient reports that he occasionally checks his blood pressure at home. He states that the other day his home reading was 173/80s. When this occurred he expresses that he did not feel any symptoms and was fine. He is complaint with daily 10 mg Norvasc, twice daily 6.25 mg Coreg, 50 mg twice daily hydralazine, olmesartan 20 mg daily.  Anxiety and Depression Patient expresses that his overall mood has been bad lately due to pressures in his life and chronic medical issues. He mentions that he is eating right, but is not seeing any results. He is complaint with 20 mg Prozac and 50 mg trazodone.    CKD Patient reports that he regularly sees his nephrologist, Luis Wood.  Past Medical History:  Diagnosis Date   Asthma    Hypertension      Social History   Tobacco Use   Smoking status: Never   Smokeless tobacco: Never  Substance Use Topics   Alcohol use: No   Drug use: No    History reviewed. No pertinent surgical history.  Family History  Problem Relation Age of Onset   Other Mother        uknown   Hypertension Father    Other Father        rhabdolmylosis   Diabetes Father    Cancer Maternal Grandmother    Cancer Paternal Grandmother     Allergies  Allergen Reactions   Aspirin Other (See Comments)    Dizziness and fainting, this was a childhood allergy     Current Medications:   Current Outpatient Medications:    albuterol (PROVENTIL HFA;VENTOLIN HFA) 108 (90 BASE) MCG/ACT inhaler, Inhale 2 puffs into the lungs every 4 (four) hours as needed. For wheezing., Disp: , Rfl:    amLODipine (NORVASC) 10 MG tablet, Take 10 mg by mouth daily., Disp: , Rfl:    Cholecalciferol (VITAMIN D3) 50 MCG (2000 UT) capsule, Take 2,000 Units by mouth daily., Disp: , Rfl:    ezetimibe (ZETIA) 10 MG tablet, Take 1 tablet (10 mg total) by mouth daily., Disp: 90 tablet, Rfl: 3   FLUoxetine (PROZAC) 20 MG capsule, Take 1 capsule (20 mg total) by mouth daily., Disp: 90 capsule, Rfl: 3   hydrALAZINE (APRESOLINE) 25 MG tablet, Take 1 tablet (25 mg total) by mouth 3 (three) times daily., Disp: 270 tablet, Rfl: 3   hydrOXYzine (ATARAX) 10 MG tablet, Take 1 tablet (10 mg total) by mouth 3 (three) times daily as needed., Disp: 30 tablet, Rfl: 0   insulin detemir (LEVEMIR) 100 UNIT/ML injection, Inject 0.16 mLs (16 Units total) into the skin at bedtime., Disp: 10 mL, Rfl: 1   olmesartan (BENICAR) 20 MG tablet, Take 1 tablet (20 mg total) by mouth daily., Disp: 90 tablet, Rfl: 3   rosuvastatin (CRESTOR) 20 MG  tablet, Take 1 tablet (20 mg total) by mouth daily., Disp: 90 tablet, Rfl: 3   traZODone (DESYREL) 50 MG tablet, Take 0.5-1 tablets (25-50 mg total) by mouth at bedtime as needed for sleep., Disp: 30 tablet, Rfl: 3   Review of Systems:   ROS  Vitals:   Vitals:   09/15/22 0820  BP: (!) 184/94  Pulse: (!) 111  Resp: 16  Temp: (!) 97.5 F (36.4 C)  TempSrc: Temporal  SpO2: 98%  Weight: 247 lb (112 kg)  Height: 5\' 7"  (1.702 m)     Body mass index is 38.69 kg/m.  Physical Exam:   Physical Exam Constitutional:      General: He is not in acute distress.    Appearance: Normal appearance. He is not ill-appearing.  HENT:     Head: Normocephalic and atraumatic.     Right Ear: External ear normal.     Left Ear: External ear normal.  Eyes:     Extraocular  Movements: Extraocular movements intact.     Pupils: Pupils are equal, round, and reactive to light.  Cardiovascular:     Rate and Rhythm: Normal rate and regular rhythm.     Heart sounds: Normal heart sounds. No murmur heard.    No gallop.  Pulmonary:     Effort: Pulmonary effort is normal. No respiratory distress.     Breath sounds: Normal breath sounds. No wheezing or rales.  Skin:    General: Skin is warm and dry.  Neurological:     Mental Status: He is alert and oriented to person, place, and time.  Psychiatric:        Judgment: Judgment normal.     Assessment and Plan:   Essential hypertension Above goal No evidence of end-organ damage Continue 10 mg Norvasc, twice daily 6.25 mg Coreg, 50 mg twice daily hydralazine, olmesartan 20 mg daily. Recommend close monitoring at home and if BP consistently > 130/80, needs to reach out nephrology for guidance Follow-up in 2 weeks  CKD (chronic kidney disease) stage 4, GFR 15-29 ml/min (HCC) Reviewed most recent nephrology note Prioritize better control of HTN and DM  GAD (generalized anxiety disorder) Overall stable Continue prozac 20 mg daily Follow-up if new/worsening sx I discussed with patient that if they develop any SI, to tell someone immediately and seek medical attention. Denies SI/HI  Type 2 diabetes mellitus with stage 4 chronic kidney disease, with long-term current use of insulin ([name]) Uncontrolled Strong desire to stop insulin Advised as follows: Start Mounjaro 2.5 mg weekly Decrease your insulin to 12 units nightly the day that you start the [facility] **If you experience severe vomiting or diarrhea with Mounjaro PLEASE CALL ME -- this can cause worsening kidney function Please plan to follow-up in 2 weeks and we will recheck your kidney function and consider decrease of your insulin further I would like for you to start checking your blood sugars every morning and writing them down for me  I,Luis Wood,acting  as a scribe for [employer], PA.,have documented all relevant documentation on the behalf of Luis Coke, PA,as directed by  Luis Coke, PA while in the presence of Luis Wood, [place].  I, Luis Wood, [place], have reviewed all documentation for this visit. The documentation on 09/15/22 for the exam, diagnosis, procedures, and orders are all accurate and complete.  Luis Coke, PA-C

## 2022-09-15 NOTE — Patient Instructions (Signed)
It was great to see you!  It is very important to keep a close eye on your blood pressure Please continue to monitor closely and follow-up with your kidney doctor if your readings remain > 130/80  Start Mounjaro 2.5 mg weekly Decrease your insulin to 12 units nightly the day that you start the [facility] **If you experience severe vomiting or diarrhea with Mounjaro PLEASE CALL ME -- this can cause worsening kidney function Please plan to follow-up in 2 weeks and we will recheck your kidney function and consider decrease of your insulin further  I would like for you to start checking your blood sugars every morning and writing them down for me  Let's follow-up in 2 weeks, sooner if you have concerns.  Take care,  Inda Coke PA-C

## 2022-09-21 ENCOUNTER — Ambulatory Visit: Payer: 59

## 2022-09-27 ENCOUNTER — Other Ambulatory Visit: Payer: Self-pay | Admitting: *Deleted

## 2022-09-27 ENCOUNTER — Telehealth: Payer: Self-pay | Admitting: *Deleted

## 2022-09-27 MED ORDER — TRULICITY 0.75 MG/0.5ML ~~LOC~~ SOAJ: 0.7500 mg | SUBCUTANEOUS | 2 refills | Status: DC

## 2022-09-27 NOTE — Progress Notes (Signed)
Left message on voicemail to call office.  

## 2022-09-27 NOTE — Telephone Encounter (Signed)
Left message on voicemail to call office.  

## 2022-09-27 NOTE — Telephone Encounter (Signed)
Pt called back, told him Darcel Bayley was not covered by insurance so Aldona Bar changed it to [employer]. I have sent Rx for Trulicity 6.06 mg once a week. Told him can finish out Mounjaro and then start Trulicity. Pt verbalized understanding.

## 2022-10-02 ENCOUNTER — Telehealth: Payer: Self-pay | Admitting: *Deleted

## 2022-10-02 NOTE — Telephone Encounter (Signed)
Called [employer] spoke to [place], told her PA for Trulicity has been approved for one year. Luis Wood verbalized understanding.

## 2022-10-02 NOTE — Telephone Encounter (Signed)
Left message on voicemail to call office.  

## 2022-10-02 NOTE — Telephone Encounter (Signed)
Received fax from pharmacy PA needed for Trulicity 1.38 mg. PA done thru Covermymeds. Key: [id number] Approved today Your PA request has been approved. Additional information will be provided in the approval communication. (Message 1145) Authorization Expiration Date: 10/02/2023.

## 2022-10-03 NOTE — Telephone Encounter (Signed)
Left message on voicemail to call office.  

## 2022-10-16 ENCOUNTER — Ambulatory Visit: Payer: 59

## 2022-10-17 NOTE — Telephone Encounter (Signed)
Left message on voicemail to call office.  

## 2022-10-17 NOTE — Telephone Encounter (Signed)
Patient returned call. States he is taking Trulicity. He is at work ann unable to answer his phone until after 5 pm.

## 2022-10-17 NOTE — Telephone Encounter (Signed)
Noted  

## 2022-10-29 NOTE — Progress Notes (Deleted)
Cardiology Office Note:    Date:  10/29/2022   ID:  Luis Wood, DOB 15-Dec-1970, MRN [id number]  PCP:  Inda Coke, PA  Cardiologist:  None  Electrophysiologist:  None   Referring MD: Inda Coke, PA   No chief complaint on file.   History of Present Illness:    Luis Wood is a 52 y.o. male with a hx of T2DM, hypertension, obesity, CKD stage IV, hyperlipidemia, asthma who presents for follow-up.  He was referred by Inda Coke, PA for evaluation of syncope, initially seen 07/20/2022.  He reports that on 06/26/2022 had syncopal episode.  Reports had not eaten or drinking very much that day but had taken his insulin and then went to work out.  He did a full body workout including cardio for about 1 to 1.5 hours.  Then he went to meet up with friends.  Reports he was sitting down, about to open a bottle of water when he suddenly felt very lightheaded.  States that he became diaphoretic and was having tunnel vision.  Felt like he was going to pass out so he lied on the ground.  Friend reports he was unconscious for couple seconds, she helped him up to the couch and then he was unconscious again and she could not feel his heart beat for about 7 seconds.  Reports he was given sugar water and his symptoms improved.  He denies any chest pain or dyspnea.  Reports he works out regularly and denies any exertional symptoms.  No palpitations.  No prior history of syncopal episodes and has not had any since.  Does report intermittent lower extremity edema.  Previously smoked marijuana and cigars, but none recently.  No known family history of heart disease.  Echocardiogram 08/01/2022 showed normal biventricular function, mild LVH, grade 2 diastolic dysfunction, mild left atrial enlargement, no significant valvular disease.  Zio patch x 13 days on 08/23/2022 showed no significant arrhythmias, high resting heart rate with average 103 bpm.  Since last clinic visit, ***OSA, lipid  panel  Past Medical History:  Diagnosis Date   Asthma    Hypertension     No past surgical history on file.  Current Medications: No outpatient medications have been marked as taking for the 10/30/22 encounter (Appointment) with Donato Heinz, MD.     Allergies:   Aspirin   Social History   Socioeconomic History   Marital status: Significant Other    Spouse name: Not on file   Number of children: Not on file   Years of education: Not on file   Highest education level: Not on file  Occupational History   Not on file  Tobacco Use   Smoking status: Never   Smokeless tobacco: Never  Substance and Sexual Activity   Alcohol use: No   Drug use: No   Sexual activity: Not on file  Other Topics Concern   Not on file  Social History Narrative   FT   [place]   Live with family   Social Determinants of Health   Financial Resource Strain: Not on file  Food Insecurity: Not on file  Transportation Needs: Not on file  Physical Activity: Not on file  Stress: Not on file  Social Connections: Not on file     Family History: The patient's family history includes Cancer in his maternal grandmother and paternal grandmother; Diabetes in his father; Hypertension in his father; Other in his father and mother.  ROS:   Please  see the history of present illness.     All other systems reviewed and are negative.  EKGs/Labs/Other Studies Reviewed:    The following studies were reviewed today:   EKG:   07/20/2022: Normal sinus rhythm, rate 99, LVH with repolarization abnormalities, T wave inversions in leads I, aVL, V4-6, concave ST elevation in V1/2.  Similar to prior EKG 06/28/2022  Recent Labs: 05/22/2022: TSH 1.96 07/20/2022: ALT 16; BUN 33; Creatinine, Ser 3.36; Hemoglobin 10.4; Platelets 253; Potassium 4.7; Sodium 137  Recent Lipid Panel    Component Value Date/Time   CHOL 250 (H) 07/20/2022 0859   TRIG 139 07/20/2022 0859   HDL 55 07/20/2022 0859    CHOLHDL 4.5 07/20/2022 0859   CHOLHDL 8 05/22/2022 1333   VLDL 47.8 (H) 05/22/2022 1333   LDLCALC 170 (H) 07/20/2022 0859   LDLDIRECT 288.0 05/22/2022 1333    Physical Exam:    VS:  There were no vitals taken for this visit.    Wt Readings from Last 3 Encounters:  09/15/22 247 lb (112 kg)  07/20/22 248 lb 6.4 oz (112.7 kg)  06/28/22 241 lb 6.1 oz (109.5 kg)     GEN:  Well nourished, well developed in no acute distress HEENT: Normal NECK: No JVD; No carotid bruits LYMPHATICS: No lymphadenopathy CARDIAC: RRR, no murmurs, rubs, gallops RESPIRATORY:  Clear to auscultation without rales, wheezing or rhonchi  ABDOMEN: Soft, non-tender, non-distended MUSCULOSKELETAL:  No edema; No deformity  SKIN: Warm and dry NEUROLOGIC:  Alert and oriented x 3 PSYCHIATRIC:  Normal affect   ASSESSMENT:    No diagnosis found.  PLAN:    Syncope: Suspect likely hypoglycemic event as reports symptoms improved with drinking sugar water.  Echocardiogram 08/01/2022 showed normal biventricular function, mild LVH, grade 2 diastolic dysfunction, mild left atrial enlargement, no significant valvular disease.  Zio patch x 13 days on 08/23/2022 showed no significant arrhythmias, high resting heart rate with average 103 bpm. -Check TSH, free T4 given high resting heart rate  Hypertension: On hydralazine 50 mg twice daily, carvedilol 6.25 mg twice daily amlodipine 10 mg daily, and olmesartan 20 mg daily.   -***OSA  Hyperlipidemia:  LDL 288 on 05/22/2022.  Started on rosuvastatin 10 mg daily, LDL 170 on 07/20/2022.  Rosuvastatin increased to 20 mg daily and Zetia 10 mg daily added.  Check lipid panel***  T2DM: On insulin.  A1c 10.0% on 08/21/2022  CKD stage IV: Creatinine 3.4 on 07/20/2022.  Follows with nephrology  RTC in***  Medication Adjustments/Labs and Tests Ordered: Current medicines are reviewed at length with the patient today.  Concerns regarding medicines are outlined above.  No orders of the  defined types were placed in this encounter.  No orders of the defined types were placed in this encounter.   There are no Patient Instructions on file for this visit.   Signed, Donato Heinz, MD  10/29/2022 12:06 PM    [facility]

## 2022-10-30 ENCOUNTER — Encounter (HOSPITAL_BASED_OUTPATIENT_CLINIC_OR_DEPARTMENT_OTHER): Payer: Self-pay

## 2022-10-30 ENCOUNTER — Ambulatory Visit: Payer: 59 | Admitting: Cardiology

## 2022-10-30 ENCOUNTER — Ambulatory Visit (HOSPITAL_BASED_OUTPATIENT_CLINIC_OR_DEPARTMENT_OTHER): Payer: 59 | Admitting: Internal Medicine

## 2022-10-31 ENCOUNTER — Ambulatory Visit: Payer: 59 | Attending: Internal Medicine

## 2022-10-31 NOTE — Progress Notes (Deleted)
Patient ID: JACKS MUTTI                 DOB: 04/01/1971                    MRN: [id number]     HPI: Luis Wood is a 52 y.o. male patient referred to pharmacy clinic by *** to initiate weight loss therapy with GLP1-RA. PMH is significant for obesity, ***. Most recent BMI ***.  Baseline weight and BMI: *** Current weight and BMI: *** Current meds that affect weight: ****  *** If diabetic and on insulin/sulfonylurea, can consider reducing dose to reduce risk of hypoglycemia  *** Follow-up visit  Assess % weight loss Assess adverse effects Missed doses  Diet:   Exercise:   Family History:   Social History:   Labs: Lab Results  Component Value Date   HGBA1C 10.0 (H) 08/21/2022    Wt Readings from Last 1 Encounters:  09/15/22 247 lb (112 kg)    BP Readings from Last 1 Encounters:  09/15/22 (!) 187/92   Pulse Readings from Last 1 Encounters:  09/15/22 (!) 111       Component Value Date/Time   CHOL 250 (H) 07/20/2022 0859   TRIG 139 07/20/2022 0859   HDL 55 07/20/2022 0859   CHOLHDL 4.5 07/20/2022 0859   CHOLHDL 8 05/22/2022 1333   VLDL 47.8 (H) 05/22/2022 1333   LDLCALC 170 (H) 07/20/2022 0859   LDLDIRECT 288.0 05/22/2022 1333    Past Medical History:  Diagnosis Date   Asthma    Hypertension     Current Outpatient Medications on File Prior to Visit  Medication Sig Dispense Refill   albuterol (PROVENTIL HFA;VENTOLIN HFA) 108 (90 BASE) MCG/ACT inhaler Inhale 2 puffs into the lungs every 4 (four) hours as needed. For wheezing.     amLODipine (NORVASC) 10 MG tablet Take 10 mg by mouth daily.     carvedilol (COREG) 6.25 MG tablet Take 6.25 mg by mouth 2 (two) times daily.     Cholecalciferol (VITAMIN D3) 50 MCG (2000 UT) capsule Take 2,000 Units by mouth daily.     Dulaglutide (TRULICITY) A999333 0000000 SOPN Inject 0.75 mg into the skin once a week. 2 mL 2   ezetimibe (ZETIA) 10 MG tablet Take 1 tablet (10 mg total) by mouth daily. 90 tablet 3    FEROSUL 325 (65 Fe) MG tablet Take 325 mg by mouth daily.     FLUoxetine (PROZAC) 20 MG capsule Take 1 capsule (20 mg total) by mouth daily. 90 capsule 3   hydrALAZINE (APRESOLINE) 50 MG tablet Take 50 mg by mouth 2 (two) times daily.     hydrOXYzine (ATARAX) 10 MG tablet Take 1 tablet (10 mg total) by mouth 3 (three) times daily as needed. 30 tablet 0   insulin detemir (LEVEMIR) 100 UNIT/ML injection Inject 0.16 mLs (16 Units total) into the skin at bedtime. 10 mL 1   olmesartan (BENICAR) 20 MG tablet Take 1 tablet (20 mg total) by mouth daily. 90 tablet 3   rosuvastatin (CRESTOR) 20 MG tablet Take 1 tablet (20 mg total) by mouth daily. 90 tablet 3   traZODone (DESYREL) 50 MG tablet Take 0.5-1 tablets (25-50 mg total) by mouth at bedtime as needed for sleep. 30 tablet 3   No current facility-administered medications on file prior to visit.    Allergies  Allergen Reactions   Aspirin Other (See Comments)    Dizziness and fainting, this was a  childhood allergy     Assessment/Plan:  1. Weight loss - Patient has not met goal of at least 5% of body weight loss with comprehensive lifestyle modifications alone in the past 3-6 months. Pharmacotherapy is appropriate to pursue as augmentation. Will start ***. Confirmed patient not ***pregnant and no personal or family history of medullary thyroid carcinoma (MTC) or Multiple Endocrine Neoplasia syndrome type 2 (MEN 2). Injection technique reviewed at today's visit.  Advised patient on common side effects including nausea, diarrhea, dyspepsia, decreased appetite, and fatigue. Counseled patient on reducing meal size and how to titrate medication to minimize side effects. Counseled patient to call if intolerable side effects or if experiencing dehydration, abdominal pain, or dizziness. Patient will adhere to dietary modifications and will target at least 150 minutes of moderate intensity exercise weekly.   Titration Plan:  Will plan to follow the  titration plan as below, pending patient is tolerating each dose before increasing to the next. Can slow titration if needed for tolerability.    -Month 1: Inject *** SQ once weekly x 4 weeks -Month 2: Inject *** SQ once weekly x 4 weeks -Month 3: Inject *** SQ once weekly x 4 weeks -Month 4+: Inject *** SQ once weekly   Follow up in ***.

## 2022-11-14 ENCOUNTER — Encounter: Payer: Self-pay | Admitting: Internal Medicine

## 2022-12-04 ENCOUNTER — Ambulatory Visit: Payer: 59 | Admitting: General Practice

## 2022-12-18 ENCOUNTER — Other Ambulatory Visit: Payer: Self-pay | Admitting: Physician Assistant

## 2023-01-02 ENCOUNTER — Telehealth: Payer: Self-pay | Admitting: Pharmacist

## 2023-01-02 NOTE — Telephone Encounter (Signed)
This patient is appearing on list of patients taking Levemir.  [employer] will on longer manufacturer Levemir in the [place] as of December 25, 2022. Supply of Levemir is expected to be depleted in the next few months.  Also noted patient has low adherence for several medications and A1c was 10% when last checked. He was referred to see endocrinology but looks like he did not make appt.   Medication:  Levemir - 16 units at bedtime - LF - 05/17/2022 Trulicity 0.75mg  LF - 12/18/2022  Hypertension meds -  olmesartan LF - 05/2022 - 90 days Hydralazine 50mg  - LF 12/17/2022 - 30 days Carvedilol - LF - 12/17/2022 - 30 days  Hyperlipidemia -  rosuvastatin 20mg  - LF 11/25/2022 - 30 days.  Ezetimibe - LF 07/28/2022 - 30 days  Attempted to out reach patient to discuss adhence and possible alternatives to levemir as well as try to get scheduled with endocrinology office. Unable to reach patient. LM on VM with CB# [phone]

## 2023-01-09 DIAGNOSIS — J45998 Other asthma: Secondary | ICD-10-CM | POA: Diagnosis not present

## 2023-01-09 DIAGNOSIS — M25551 Pain in right hip: Secondary | ICD-10-CM | POA: Diagnosis not present

## 2023-01-15 ENCOUNTER — Other Ambulatory Visit: Payer: Self-pay | Admitting: Physician Assistant

## 2023-02-02 ENCOUNTER — Telehealth: Payer: Self-pay | Admitting: Physician Assistant

## 2023-02-02 NOTE — Telephone Encounter (Signed)
Patient states he was unable to pick up recent trulicity refill due to it being too much out of pocket. States he called insurance and they gave him some alternative medications and out of pocket pricing. Pt would like to speak with pcp about other options. Pt has been scheduled with pcp on 5/15.

## 2023-02-06 NOTE — Progress Notes (Signed)
Luis Wood is a 52 y.o. male here for medication counseling.  History of Present Illness:   No chief complaint on file.   HPI      Past Medical History:  Diagnosis Date   Asthma    Hypertension      Social History   Tobacco Use   Smoking status: Never   Smokeless tobacco: Never  Substance Use Topics   Alcohol use: No   Drug use: No    No past surgical history on file.  Family History  Problem Relation Age of Onset   Other Mother        uknown   Hypertension Father    Other Father        rhabdolmylosis   Diabetes Father    Cancer Maternal Grandmother    Cancer Paternal Grandmother     Allergies  Allergen Reactions   Aspirin Other (See Comments)    Dizziness and fainting, this was a childhood allergy    Current Medications:   Current Outpatient Medications:    albuterol (PROVENTIL HFA;VENTOLIN HFA) 108 (90 BASE) MCG/ACT inhaler, Inhale 2 puffs into the lungs every 4 (four) hours as needed. For wheezing., Disp: , Rfl:    amLODipine (NORVASC) 10 MG tablet, Take 10 mg by mouth daily., Disp: , Rfl:    carvedilol (COREG) 6.25 MG tablet, Take 6.25 mg by mouth 2 (two) times daily., Disp: , Rfl:    Cholecalciferol (VITAMIN D3) 50 MCG (2000 UT) capsule, Take 2,000 Units by mouth daily., Disp: , Rfl:    ezetimibe (ZETIA) 10 MG tablet, Take 1 tablet (10 mg total) by mouth daily., Disp: 90 tablet, Rfl: 3   FEROSUL 325 (65 Fe) MG tablet, Take 325 mg by mouth daily., Disp: , Rfl:    FLUoxetine (PROZAC) 20 MG capsule, Take 1 capsule (20 mg total) by mouth daily., Disp: 90 capsule, Rfl: 3   hydrALAZINE (APRESOLINE) 50 MG tablet, Take 50 mg by mouth 2 (two) times daily., Disp: , Rfl:    hydrOXYzine (ATARAX) 10 MG tablet, Take 1 tablet (10 mg total) by mouth 3 (three) times daily as needed., Disp: 30 tablet, Rfl: 0   insulin detemir (LEVEMIR) 100 UNIT/ML injection, Inject 0.16 mLs (16 Units total) into the skin at bedtime., Disp: 10 mL, Rfl: 1   olmesartan (BENICAR)  20 MG tablet, Take 1 tablet (20 mg total) by mouth daily., Disp: 90 tablet, Rfl: 3   rosuvastatin (CRESTOR) 20 MG tablet, Take 1 tablet (20 mg total) by mouth daily., Disp: 90 tablet, Rfl: 3   traZODone (DESYREL) 50 MG tablet, Take 0.5-1 tablets (25-50 mg total) by mouth at bedtime as needed for sleep., Disp: 30 tablet, Rfl: 3   TRULICITY 0.75 MG/0.5ML SOPN, INJECT 0.75 MG  SUBCUTANEOUSLY ONCE A WEEK, Disp: 4 mL, Rfl: 0   Review of Systems:   ROS  Vitals:   There were no vitals filed for this visit.   There is no height or weight on file to calculate BMI.  Physical Exam:   Physical Exam  Assessment and Plan:   There are no diagnoses linked to this encounter.   Jarold Motto, PA-C

## 2023-02-07 ENCOUNTER — Ambulatory Visit: Payer: 59 | Admitting: Physician Assistant

## 2023-02-07 ENCOUNTER — Encounter: Payer: Self-pay | Admitting: Physician Assistant

## 2023-02-07 ENCOUNTER — Other Ambulatory Visit (HOSPITAL_BASED_OUTPATIENT_CLINIC_OR_DEPARTMENT_OTHER): Payer: Self-pay

## 2023-02-07 VITALS — BP 160/110 | HR 103 | Temp 98.0°F | Ht 67.0 in | Wt 234.0 lb

## 2023-02-07 DIAGNOSIS — N184 Chronic kidney disease, stage 4 (severe): Secondary | ICD-10-CM

## 2023-02-07 DIAGNOSIS — E1169 Type 2 diabetes mellitus with other specified complication: Secondary | ICD-10-CM | POA: Diagnosis not present

## 2023-02-07 DIAGNOSIS — Z794 Long term (current) use of insulin: Secondary | ICD-10-CM

## 2023-02-07 DIAGNOSIS — I10 Essential (primary) hypertension: Secondary | ICD-10-CM | POA: Diagnosis not present

## 2023-02-07 DIAGNOSIS — E1122 Type 2 diabetes mellitus with diabetic chronic kidney disease: Secondary | ICD-10-CM | POA: Diagnosis not present

## 2023-02-07 DIAGNOSIS — E785 Hyperlipidemia, unspecified: Secondary | ICD-10-CM

## 2023-02-07 MED ORDER — TRULICITY 0.75 MG/0.5ML ~~LOC~~ SOAJ
0.7500 mg | SUBCUTANEOUS | 1 refills | Status: DC
  Filled 2023-02-07: qty 2, 28d supply, fill #0

## 2023-02-07 MED ORDER — OZEMPIC (0.25 OR 0.5 MG/DOSE) 2 MG/3ML ~~LOC~~ SOPN: 0.2500 mg | PEN_INJECTOR | SUBCUTANEOUS | 0 refills | Status: DC

## 2023-02-07 NOTE — Patient Instructions (Signed)
It was great to see you!  Taurine -- please stop pre-workout if this ingredient is in your mix  Start Ozempic 0.25 mg weekly  IN THE MEANTIME, PLEASE GO TO [email or web address].[email or web address]-resources  AND COMPLETE CARD. Print and take to pharmacy.   When you are done with your weekly Ozempic, take your Trulicity coupon to the pharmacy and get it filled.   Take care,  Jarold Motto PA-C

## 2023-02-08 ENCOUNTER — Telehealth: Payer: Self-pay | Admitting: Pharmacist

## 2023-02-08 ENCOUNTER — Other Ambulatory Visit: Payer: 59 | Admitting: Pharmacist

## 2023-02-08 ENCOUNTER — Other Ambulatory Visit: Payer: Self-pay | Admitting: Physician Assistant

## 2023-02-08 ENCOUNTER — Other Ambulatory Visit (HOSPITAL_BASED_OUTPATIENT_CLINIC_OR_DEPARTMENT_OTHER): Payer: Self-pay

## 2023-02-08 DIAGNOSIS — Z1211 Encounter for screening for malignant neoplasm of colon: Secondary | ICD-10-CM

## 2023-02-08 LAB — COMPREHENSIVE METABOLIC PANEL
ALT: 11 U/L (ref 0–53)
AST: 12 U/L (ref 0–37)
Albumin: 3.5 g/dL (ref 3.5–5.2)
Alkaline Phosphatase: 98 U/L (ref 39–117)
BUN: 38 mg/dL — ABNORMAL HIGH (ref 6–23)
CO2: 25 mEq/L (ref 19–32)
Calcium: 8.8 mg/dL (ref 8.4–10.5)
Chloride: 104 mEq/L (ref 96–112)
Creatinine, Ser: 4.37 mg/dL — ABNORMAL HIGH (ref 0.40–1.50)
GFR: 15.06 mL/min — ABNORMAL LOW (ref >60.00)
Glucose, Bld: 188 mg/dL — ABNORMAL HIGH (ref 70–99)
Potassium: 4.1 mEq/L (ref 3.5–5.1)
Sodium: 137 mEq/L (ref 135–145)
Total Bilirubin: 0.2 mg/dL (ref 0.2–1.2)
Total Protein: 6.6 g/dL (ref 6.0–8.3)

## 2023-02-08 LAB — CBC WITH DIFFERENTIAL/PLATELET
Basophils Absolute: 0.1 10*3/uL (ref 0.0–0.1)
Basophils Relative: 0.8 % (ref 0.0–3.0)
Eosinophils Absolute: 0.2 10*3/uL (ref 0.0–0.7)
Eosinophils Relative: 1.8 % (ref 0.0–5.0)
HCT: 27.2 % — ABNORMAL LOW (ref 39.0–52.0)
Hemoglobin: 9 g/dL — ABNORMAL LOW (ref 13.0–17.0)
Lymphocytes Relative: 17.6 % (ref 12.0–46.0)
Lymphs Abs: 1.9 10*3/uL (ref 0.7–4.0)
MCHC: 33 g/dL (ref 30.0–36.0)
MCV: 88.5 fl (ref 78.0–100.0)
Monocytes Absolute: 1 10*3/uL (ref 0.1–1.0)
Monocytes Relative: 9.2 % (ref 3.0–12.0)
Neutro Abs: 7.4 10*3/uL (ref 1.4–7.7)
Neutrophils Relative %: 70.6 % (ref 43.0–77.0)
Platelets: 357 10*3/uL (ref 150.0–400.0)
RBC: 3.08 Mil/uL — ABNORMAL LOW (ref 4.22–5.81)
RDW: 14.3 % (ref 11.5–15.5)
WBC: 10.6 10*3/uL — ABNORMAL HIGH (ref 4.0–10.5)

## 2023-02-08 LAB — LIPID PANEL
Cholesterol: 230 mg/dL — ABNORMAL HIGH (ref 0–200)
HDL: 53 mg/dL (ref >39.00)
LDL Cholesterol: 152 mg/dL — ABNORMAL HIGH (ref 0–99)
NonHDL: 177.48
Total CHOL/HDL Ratio: 4
Triglycerides: 129 mg/dL (ref 0.0–149.0)
VLDL: 25.8 mg/dL (ref 0.0–40.0)

## 2023-02-08 LAB — HEMOGLOBIN A1C: Hgb A1c MFr Bld: 7.7 % — ABNORMAL HIGH (ref 4.6–6.5)

## 2023-02-08 MED ORDER — ROSUVASTATIN CALCIUM 10 MG PO TABS: 10.0000 mg | ORAL_TABLET | Freq: Every day | ORAL | 3 refills | Status: DC

## 2023-02-08 MED ORDER — EZETIMIBE 10 MG PO TABS: 10.0000 mg | ORAL_TABLET | Freq: Every day | ORAL | 3 refills | Status: DC

## 2023-02-08 NOTE — Progress Notes (Signed)
02/08/2023 Name: Luis Wood MRN: [id number] DOB: Aug 17, 1971  Chief Complaint  Patient presents with   Medication Management    Trulicity cost    Luis Wood is a 52 y.o. year old male who presented for a telephone visit.   They were referred to the pharmacist by their PCP for assistance in managing medication access.   Subjective:  Received message from patient's PCP that the cost of Trulicity was $290 but previously was $25 to $30 per month. He was given 6 weeks for Ozempic at recent office visit to hold him until we can find a way for him to afford either Trulicity or Ozempic.   Medication Access/Adherence  Current Pharmacy:  [facility] [address] [place] - [address] [place] [address] Phone: [phone] Fax: [phone]  [facility] [address] [place] [address] Phone: [phone] Fax: [phone]   Patient reports affordability concerns with their medications: Yes  Patient reports access/transportation concerns to their pharmacy: No  Patient reports adherence concerns with their medications:  No       Objective:  Lab Results  Component Value Date   HGBA1C 7.7 (H) 02/07/2023    Lab Results  Component Value Date   CREATININE 4.37 (H) 02/07/2023   BUN 38 (H) 02/07/2023   NA 137 02/07/2023   K 4.1 02/07/2023   CL 104 02/07/2023   CO2 25 02/07/2023    Lab Results  Component Value Date   CHOL 230 (H) 02/07/2023   HDL 53.00 02/07/2023   LDLCALC 152 (H) 02/07/2023   LDLDIRECT 288.0 05/22/2022   TRIG 129.0 02/07/2023   CHOLHDL 4 02/07/2023    Medications Reviewed Today     Reviewed by [name], LPN (Licensed Practical Nurse) on 02/07/23 at 1446  Med List Status: <None>   Medication Order Taking? Sig Documenting Provider Last Dose Status Informant  albuterol (PROVENTIL HFA;VENTOLIN HFA) 108 (90 BASE) MCG/ACT inhaler  [id number] Yes Inhale 2 puffs into the lungs every 4 (four) hours as needed. For wheezing. [provider] Taking Active Self           Med Note Iran Ouch, AMBER C   Sat Mar 22, 2013  9:03 AM)     amLODipine (NORVASC) 10 MG tablet [id number] Yes Take 10 mg by mouth daily. [provider] Taking Active   carvedilol (COREG) 6.25 MG tablet [id number] Yes Take 6.25 mg by mouth 2 (two) times daily. [provider] Taking Active   Cholecalciferol (VITAMIN D3) 50 MCG (2000 UT) capsule [id number] Yes Take 2,000 Units by mouth daily. [provider] Taking Active   ezetimibe (ZETIA) 10 MG tablet [id number] Yes Take 1 tablet (10 mg total) by mouth daily. Little Ishikawa, MD Taking Active   FEROSUL 325 715 852 9090 Fe) MG tablet [id number] Yes Take 325 mg by mouth daily. [provider] Taking Active   FLUoxetine (PROZAC) 20 MG capsule [id number] Yes Take 1 capsule (20 mg total) by mouth daily. Janeece Agee, NP Taking Active   hydrALAZINE (APRESOLINE) 50 MG tablet [id number] Yes Take 50 mg by mouth 2 (two) times daily. [provider] Taking Active   hydrOXYzine (ATARAX) 10 MG tablet [id number] Yes Take 1 tablet (10 mg total) by mouth 3 (three) times daily as needed. Janeece Agee, NP Taking Active   olmesartan (BENICAR) 20 MG tablet [id number] Yes Take 1 tablet (20 mg total) by mouth daily. Jarold Motto, [place] Taking  Active   rosuvastatin (CRESTOR) 20 MG tablet [id number] Yes Take 1 tablet (20 mg total) by mouth daily. Jarold Motto, [place] Taking Active   traZODone (DESYREL) 50 MG tablet [id number] Yes Take 0.5-1 tablets (25-50 mg total) by mouth at bedtime as needed for sleep. Janeece Agee, NP Taking Active   TRULICITY 0.75 MG/0.5ML Namon Cirri [id number] Yes INJECT 0.75 MG  SUBCUTANEOUSLY ONCE A WEEK Jarold Motto, [place] Taking Active               Assessment/Plan:  Medication Cost Contacted patient's [employer] - has ACA plan. Trulicity is preferred  brand but patient has $7500 deductible. [employer] representative could not provide me with how much patient has spent toward deductible. Only patient can call to request that information.  Screened for [place] Medicaid - patient did not meet income requirements. I did sign patient up for Trulicity discount card but will only take off $150 per 30 days which will lower Truclity cost to $130 - 140 per month.  Tried to enroll for [employer] but type 2 DM is closed for enrollment right now.  Trulicity's patient assistance program is not enrolling new patients currently and Ozempic patient assistance program does not allow patients with any type of commercial insurance to enroll.    Henrene Pastor, PharmD Clinical Pharmacist [facility]

## 2023-02-08 NOTE — Telephone Encounter (Signed)
Received message from patient's PCP that the cost of Trulicity was $290 but has previously been $30.  Tried to call patient to discussed but has to LM on VM.  He called back 10 minutes later - see phone visit for more information.

## 2023-02-14 ENCOUNTER — Encounter: Payer: Self-pay | Admitting: Gastroenterology

## 2023-02-14 ENCOUNTER — Other Ambulatory Visit (HOSPITAL_BASED_OUTPATIENT_CLINIC_OR_DEPARTMENT_OTHER): Payer: Self-pay

## 2023-02-16 ENCOUNTER — Telehealth: Payer: Self-pay | Admitting: Pharmacist

## 2023-02-16 NOTE — Telephone Encounter (Signed)
Unsuccessful outreach to patient regarding Trulicity cost (only able to get discount card for patient which lowered cost to around $140 / 30 days).  He has samples of Ozempic that should last about 4 ro 5 more weeks.  Also was calling to see if patient contact his health insurance to see how close he is to meeting his $7500 deductibe. If he is close then cost of Trulicity would likely be lower in the future.  LM on VM with CB# [phone]

## 2023-02-22 ENCOUNTER — Ambulatory Visit: Payer: 59 | Admitting: Physician Assistant

## 2023-03-01 ENCOUNTER — Ambulatory Visit (AMBULATORY_SURGERY_CENTER): Payer: 59

## 2023-03-01 VITALS — Ht 67.0 in | Wt 231.0 lb

## 2023-03-01 DIAGNOSIS — Z1211 Encounter for screening for malignant neoplasm of colon: Secondary | ICD-10-CM

## 2023-03-01 MED ORDER — NA SULFATE-K SULFATE-MG SULF 17.5-3.13-1.6 GM/177ML PO SOLN
1.0000 | Freq: Once | ORAL | 0 refills | Status: AC
Start: 2023-03-01 — End: 2023-03-01

## 2023-03-01 NOTE — Progress Notes (Signed)
No egg or soy allergy known to patient  No issues known to pt with past sedation with any surgeries or procedures Patient denies ever being told they had issues or difficulty with intubation  No FH of Malignant Hyperthermia Pt is not on diet pills Pt is not on  home 02  Pt is not on blood thinners  Pt denies issues with constipation  No A fib or A flutter Have any cardiac testing pending--no Pt instructed to use Singlecare.com or GoodRx for a price reduction on prep  Can ambulate with out assistance 

## 2023-03-08 ENCOUNTER — Telehealth: Payer: Self-pay | Admitting: Physician Assistant

## 2023-03-08 NOTE — Telephone Encounter (Signed)
Patient requests more samples of Ozempic (has 1 more left).  Patient requests to be advised.

## 2023-03-09 ENCOUNTER — Encounter: Payer: Self-pay | Admitting: Gastroenterology

## 2023-03-12 NOTE — Telephone Encounter (Signed)
Ok to give patient sample? 

## 2023-03-12 NOTE — Telephone Encounter (Signed)
Returned pt call and lvm advising PCP recommendations and Tammy Eckard cb number.

## 2023-03-26 ENCOUNTER — Encounter: Payer: 59 | Admitting: Gastroenterology

## 2023-03-26 NOTE — Progress Notes (Deleted)
R/s pt to am colon per request, previsit also sched

## 2023-04-20 ENCOUNTER — Telehealth: Payer: Self-pay

## 2023-04-20 NOTE — Telephone Encounter (Signed)
Patient NO SHOW for pre visit.  Patient did call back to reschedule pre visit, so pre visit and colonoscopy were cancelled and letter was mailed.

## 2023-04-20 NOTE — Telephone Encounter (Signed)
Patient No Showed Pre visit.  Left message that he needed to call back and reschedule pre visit by 5pm today or his colonoscopy would be cancelled.

## 2023-04-20 NOTE — Telephone Encounter (Signed)
Called patient with no answer.  Left message that I would try back in a few minutes.

## 2023-05-15 ENCOUNTER — Telehealth: Payer: Self-pay | Admitting: Physician Assistant

## 2023-05-15 NOTE — Telephone Encounter (Signed)
Left message on voicemail to call office.  

## 2023-05-15 NOTE — Telephone Encounter (Signed)
Pt would like a call back pertaining to the Ozempic and Trulicity meds. Please advise.

## 2023-05-16 ENCOUNTER — Encounter: Payer: 59 | Admitting: Gastroenterology

## 2023-05-17 NOTE — Telephone Encounter (Signed)
Left message on voicemail to call office.  

## 2023-05-24 NOTE — Telephone Encounter (Signed)
Left message on voicemail to call office.  

## 2023-05-29 NOTE — Telephone Encounter (Signed)
Spoke to pt he said he has new insurance effective yesterday not sure if his cards are at home yet. Asked him if he has been taking Ozempic? He said no due to cost, but has changed his diet and has been exercising. Told pt once he gets his new cards take them to the pharmacy so they can enter them and then send me a My Chart message and I will send new Rx for Ozempic so you can get back on your medication. Also need to schedule f/u in November. Pt verbalized understanding.

## 2023-05-29 NOTE — Telephone Encounter (Signed)
Patient returned Donna's call. States he is off of work today so he is able to answer calls. Patient did state he also has new insurance that will be effective this month. States he will check with insurance to see if covered.

## 2023-05-30 ENCOUNTER — Telehealth: Payer: Self-pay | Admitting: Physician Assistant

## 2023-05-30 MED ORDER — OZEMPIC (0.25 OR 0.5 MG/DOSE) 2 MG/3ML ~~LOC~~ SOPN: 0.2500 mg | PEN_INJECTOR | SUBCUTANEOUS | 2 refills | Status: DC

## 2023-05-30 NOTE — Telephone Encounter (Signed)
Left message on voicemail Rx was sent to pharmacy as requested. If you have any trouble getting the Rx please let me know.

## 2023-05-30 NOTE — Telephone Encounter (Signed)
HAS NEW INSURANCE AND NEEDS NEW RX TO BE SENT TO PHARMACY.    Prescription Request  05/30/2023  LOV: 02/07/2023  What is the name of the medication or equipment?  Semaglutide,0.25 or 0.5MG /DOS, (OZEMPIC, 0.25 OR 0.5 MG/DOSE,) 2 MG/3ML SOPN    Have you contacted your pharmacy to request a refill? No   Which pharmacy would you like this sent to? [facility] [address] [place], [place] - [address] [place] [address] Phone: [phone] Fax: [phone]   Patient notified that their request is being sent to the clinical staff for review and that they should receive a response within 2 business days.   Please advise at Mobile [phone] (mobile)

## 2023-06-06 ENCOUNTER — Telehealth: Payer: Self-pay | Admitting: Pharmacy Technician

## 2023-06-06 ENCOUNTER — Other Ambulatory Visit (HOSPITAL_COMMUNITY): Payer: Self-pay

## 2023-06-06 NOTE — Telephone Encounter (Signed)
Pharmacy Patient Advocate Encounter   Received notification from CoverMyMeds that prior authorization for Ozempic (0.25 or 0.5 MG/DOSE) 2MG /3ML pen-injectors is required/requested.   Insurance verification completed.   The patient is insured through [facility] .   Per test claim: PA required; PA submitted to [employer] [facility] via CoverMyMeds Key/confirmation #/EOC [id number] Status is pending

## 2023-06-07 NOTE — Telephone Encounter (Signed)
Received response from Covermymeds PA for Ozempic 0.25 mg has been denied.

## 2023-06-14 ENCOUNTER — Encounter: Payer: Self-pay | Admitting: Pharmacist

## 2023-06-14 NOTE — Progress Notes (Addendum)
06/14/2023 Name: Luis Wood MRN: [id number] DOB: [birth year]  Chief Complaint  Patient presents with   Medication Management    Luis Wood is a 52 y.o. year old male    Subjective: They were referred to the pharmacist by their PCP for assistance in managing medication access.  Sent message to look over patient's prior authorization denial for Ozempic.  It looks like since the last time that I spoke with Mr. Richters that he has aquired a secondary insurance with [employer] of [place] with pharmacy benefits thru [employer]   From what I can tell it looks like recent prior authorization for Ozempic was submitted to his [employer] [facility] plan with [employer] thru [employer] and not BCBS state plan.   Blood glucose has improved with Trulicity but cost was a barrier. Earlier this year cost was $290. He was given 6 weeks of  Ozempic and this helped with blood glucose but needed prior authorization. A1c has improved with GLP1 agents from 10% in November 2023 to 7.7% May 2024.  Patient has also showed a decrease in weight of about 16 lbs despite not being able to take GLP1 on a regular basis.   Wt Readings from Last 3 Encounters:  03/01/23 231 lb (104.8 kg)  02/07/23 234 lb (106.1 kg)  09/15/22 247 lb (112 kg)    Medication Access/Adherence  Current Pharmacy:  [facility] [address], [place] - [address] [place] [address] Phone: [phone] Fax: [phone]  [facility] [address] [place] [address] Phone: [phone] Fax: [phone]   Patient reports affordability concerns with their medications: Yes  Patient reports access/transportation concerns to their pharmacy: No  Patient reports adherence concerns with their medications:  No       Objective:  Lab Results  Component Value Date   HGBA1C 7.7 (H) 02/07/2023    Lab Results  Component Value Date   CREATININE 4.37  (H) 02/07/2023   BUN 38 (H) 02/07/2023   NA 137 02/07/2023   K 4.1 02/07/2023   CL 104 02/07/2023   CO2 25 02/07/2023    Lab Results  Component Value Date   CHOL 230 (H) 02/07/2023   HDL 53.00 02/07/2023   LDLCALC 152 (H) 02/07/2023   LDLDIRECT 288.0 05/22/2022   TRIG 129.0 02/07/2023   CHOLHDL 4 02/07/2023    Medications Reviewed Today     Reviewed by Henrene Pastor, RPH-CPP (Pharmacist) on 06/14/23 at 1647  Med List Status: <None>   Medication Order Taking? Sig Documenting Provider Last Dose Status Informant  albuterol (PROVENTIL HFA;VENTOLIN HFA) 108 (90 BASE) MCG/ACT inhaler [id number] No Inhale 2 puffs into the lungs every 4 (four) hours as needed. For wheezing. [provider] Taking Active Self           Med Note [place] Ouch, AMBER C   Sat Mar 22, 2013  9:03 AM)     amLODipine (NORVASC) 10 MG tablet [id number] No Take 10 mg by mouth daily. [provider] Taking Active   carvedilol (COREG) 6.25 MG tablet [id number] No Take 6.25 mg by mouth 2 (two) times daily. [provider] Taking Active   Cholecalciferol (VITAMIN D3) 50 MCG (2000 UT) capsule [id number] No Take 2,000 Units by mouth daily. [provider] Taking Active   Dulaglutide (TRULICITY) 0.75 MG/0.5ML SOPN [id number] No Inject 0.75 mg into the skin once a week. Jarold Motto, [place] Taking Active   ezetimibe (ZETIA)  10 MG tablet [id number] No Take 1 tablet (10 mg total) by mouth daily. Jarold Motto, [place] Taking Active   FEROSUL 325 (65 Fe) MG tablet [id number] No Take 325 mg by mouth daily. [provider] Taking Active   hydrALAZINE (APRESOLINE) 50 MG tablet [id number] No Take 50 mg by mouth 2 (two) times daily. [provider] Taking Active   hydrOXYzine (ATARAX) 10 MG tablet [id number] No Take 1 tablet (10 mg total) by mouth 3 (three) times daily as needed.  Patient not taking: Reported on 03/01/2023   Janeece Agee, NP Not Taking Active   olmesartan (BENICAR) 20 MG  tablet [id number] No Take 1 tablet (20 mg total) by mouth daily.  Patient not taking: Reported on 03/01/2023   Jarold Motto, [place] Not Taking Active   rosuvastatin (CRESTOR) 10 MG tablet [id number] No Take 1 tablet (10 mg total) by mouth daily. Jarold Motto, [place] Taking Active   Semaglutide,0.25 or 0.5MG /DOS, (OZEMPIC, 0.25 OR 0.5 MG/DOSE,) 2 MG/3ML SOPN [id number]  Inject 0.25 mg into the skin once a week. Jarold Motto, [place]  Active   traZODone (DESYREL) 50 MG tablet [id number] No Take 0.5-1 tablets (25-50 mg total) by mouth at bedtime as needed for sleep.  Patient not taking: Reported on 03/01/2023   Janeece Agee, NP Not Taking Active               Assessment/Plan:  Type 2 DM - Medication Cost Patient's [employer] insurance - [employer] plan. Trulicity is preferred brand but patient has $7500 deductible.  Resubmitted prior authorization for Ozempic to [facility] [employer] with [employer] plan - Key: [id number] - decision pending.    Henrene Pastor, PharmD Clinical Pharmacist [facility]  06/15/2023 Addendum Received approval for Ozempic thru his [employer] / [employer] plan. Patient was notified.

## 2023-06-15 ENCOUNTER — Telehealth: Payer: Self-pay | Admitting: Pharmacist

## 2023-06-15 NOTE — Telephone Encounter (Signed)
Received message that prior authorization for Ozempic for [employer] / [employer] was approved.     Checked with [facility] and cost to patient is $24.99 LM on VM of patient that prior authorization was approved and cost. [facility] said he could pick up later today.

## 2023-09-14 ENCOUNTER — Emergency Department (HOSPITAL_COMMUNITY): Payer: BC Managed Care – PPO

## 2023-09-14 ENCOUNTER — Encounter (HOSPITAL_COMMUNITY): Payer: Self-pay

## 2023-09-14 ENCOUNTER — Other Ambulatory Visit: Payer: Self-pay

## 2023-09-14 ENCOUNTER — Inpatient Hospital Stay (HOSPITAL_COMMUNITY)
Admission: EM | Admit: 2023-09-14 | Discharge: 2023-09-16 | DRG: 305 | Disposition: A | Discharge status: Home / Self Care | Payer: BC Managed Care – PPO | Attending: Internal Medicine | Admitting: Internal Medicine

## 2023-09-14 DIAGNOSIS — R0602 Shortness of breath: Secondary | ICD-10-CM | POA: Diagnosis not present

## 2023-09-14 DIAGNOSIS — N184 Chronic kidney disease, stage 4 (severe): Secondary | ICD-10-CM | POA: Diagnosis not present

## 2023-09-14 DIAGNOSIS — J45909 Unspecified asthma, uncomplicated: Secondary | ICD-10-CM | POA: Diagnosis not present

## 2023-09-14 DIAGNOSIS — Z91148 Patient's other noncompliance with medication regimen for other reason: Secondary | ICD-10-CM | POA: Diagnosis not present

## 2023-09-14 DIAGNOSIS — I10 Essential (primary) hypertension: Secondary | ICD-10-CM | POA: Diagnosis not present

## 2023-09-14 DIAGNOSIS — Z833 Family history of diabetes mellitus: Secondary | ICD-10-CM | POA: Diagnosis not present

## 2023-09-14 DIAGNOSIS — E1165 Type 2 diabetes mellitus with hyperglycemia: Secondary | ICD-10-CM | POA: Diagnosis not present

## 2023-09-14 DIAGNOSIS — E1169 Type 2 diabetes mellitus with other specified complication: Secondary | ICD-10-CM | POA: Diagnosis not present

## 2023-09-14 DIAGNOSIS — Z7985 Long-term (current) use of injectable non-insulin antidiabetic drugs: Secondary | ICD-10-CM | POA: Diagnosis not present

## 2023-09-14 DIAGNOSIS — I129 Hypertensive chronic kidney disease with stage 1 through stage 4 chronic kidney disease, or unspecified chronic kidney disease: Secondary | ICD-10-CM | POA: Diagnosis not present

## 2023-09-14 DIAGNOSIS — E559 Vitamin D deficiency, unspecified: Secondary | ICD-10-CM | POA: Diagnosis not present

## 2023-09-14 DIAGNOSIS — N189 Chronic kidney disease, unspecified: Secondary | ICD-10-CM

## 2023-09-14 DIAGNOSIS — E1122 Type 2 diabetes mellitus with diabetic chronic kidney disease: Secondary | ICD-10-CM | POA: Diagnosis not present

## 2023-09-14 DIAGNOSIS — Z8249 Family history of ischemic heart disease and other diseases of the circulatory system: Secondary | ICD-10-CM | POA: Diagnosis not present

## 2023-09-14 DIAGNOSIS — R0789 Other chest pain: Secondary | ICD-10-CM | POA: Diagnosis not present

## 2023-09-14 DIAGNOSIS — I161 Hypertensive emergency: Principal | ICD-10-CM

## 2023-09-14 DIAGNOSIS — N185 Chronic kidney disease, stage 5: Secondary | ICD-10-CM

## 2023-09-14 DIAGNOSIS — R918 Other nonspecific abnormal finding of lung field: Secondary | ICD-10-CM | POA: Diagnosis not present

## 2023-09-14 DIAGNOSIS — D631 Anemia in chronic kidney disease: Secondary | ICD-10-CM | POA: Diagnosis present

## 2023-09-14 DIAGNOSIS — E66812 Obesity, class 2: Secondary | ICD-10-CM | POA: Insufficient documentation

## 2023-09-14 DIAGNOSIS — Z91199 Patient's noncompliance with other medical treatment and regimen due to unspecified reason: Secondary | ICD-10-CM | POA: Diagnosis not present

## 2023-09-14 DIAGNOSIS — Z6835 Body mass index (BMI) 35.0-35.9, adult: Secondary | ICD-10-CM | POA: Diagnosis not present

## 2023-09-14 DIAGNOSIS — Z886 Allergy status to analgesic agent status: Secondary | ICD-10-CM | POA: Diagnosis not present

## 2023-09-14 DIAGNOSIS — E785 Hyperlipidemia, unspecified: Secondary | ICD-10-CM | POA: Diagnosis present

## 2023-09-14 DIAGNOSIS — R0609 Other forms of dyspnea: Secondary | ICD-10-CM | POA: Diagnosis not present

## 2023-09-14 DIAGNOSIS — Z87891 Personal history of nicotine dependence: Secondary | ICD-10-CM | POA: Diagnosis not present

## 2023-09-14 DIAGNOSIS — E669 Obesity, unspecified: Secondary | ICD-10-CM | POA: Diagnosis not present

## 2023-09-14 DIAGNOSIS — N179 Acute kidney failure, unspecified: Secondary | ICD-10-CM | POA: Diagnosis not present

## 2023-09-14 DIAGNOSIS — F419 Anxiety disorder, unspecified: Secondary | ICD-10-CM | POA: Diagnosis present

## 2023-09-14 DIAGNOSIS — I160 Hypertensive urgency: Secondary | ICD-10-CM | POA: Diagnosis present

## 2023-09-14 DIAGNOSIS — Z79899 Other long term (current) drug therapy: Secondary | ICD-10-CM | POA: Diagnosis not present

## 2023-09-14 HISTORY — DX: Hypertensive emergency: I16.1

## 2023-09-14 LAB — CBC WITH DIFFERENTIAL/PLATELET
Abs Immature Granulocytes: 0.04 10*3/uL (ref 0.00–0.07)
Basophils Absolute: 0.1 10*3/uL (ref 0.0–0.1)
Basophils Relative: 1 %
Eosinophils Absolute: 0.1 10*3/uL (ref 0.0–0.5)
Eosinophils Relative: 1 %
HCT: 30.5 % — ABNORMAL LOW (ref 39.0–52.0)
Hemoglobin: 9.8 g/dL — ABNORMAL LOW (ref 13.0–17.0)
Immature Granulocytes: 0 %
Lymphocytes Relative: 14 %
Lymphs Abs: 1.6 10*3/uL (ref 0.7–4.0)
MCH: 30.2 pg (ref 26.0–34.0)
MCHC: 32.1 g/dL (ref 30.0–36.0)
MCV: 93.8 fL (ref 80.0–100.0)
Monocytes Absolute: 0.6 10*3/uL (ref 0.1–1.0)
Monocytes Relative: 6 %
Neutro Abs: 8.7 10*3/uL — ABNORMAL HIGH (ref 1.7–7.7)
Neutrophils Relative %: 78 %
Platelets: 289 10*3/uL (ref 150–400)
RBC: 3.25 MIL/uL — ABNORMAL LOW (ref 4.22–5.81)
RDW: 13.2 % (ref 11.5–15.5)
WBC: 11.1 10*3/uL — ABNORMAL HIGH (ref 4.0–10.5)
nRBC: 0 % (ref 0.0–0.2)

## 2023-09-14 LAB — COMPREHENSIVE METABOLIC PANEL
ALT: 17 U/L (ref 0–44)
AST: 18 U/L (ref 15–41)
Albumin: 3.2 g/dL — ABNORMAL LOW (ref 3.5–5.0)
Alkaline Phosphatase: 67 U/L (ref 38–126)
Anion gap: 10 (ref 5–15)
BUN: 52 mg/dL — ABNORMAL HIGH (ref 6–20)
CO2: 22 mmol/L (ref 22–32)
Calcium: 8 mg/dL — ABNORMAL LOW (ref 8.9–10.3)
Chloride: 103 mmol/L (ref 98–111)
Creatinine, Ser: 6.2 mg/dL — ABNORMAL HIGH (ref 0.61–1.24)
GFR, Estimated: 10 mL/min — ABNORMAL LOW (ref >60)
Glucose, Bld: 178 mg/dL — ABNORMAL HIGH (ref 70–99)
Potassium: 4.2 mmol/L (ref 3.5–5.1)
Sodium: 135 mmol/L (ref 135–145)
Total Bilirubin: 0.5 mg/dL (ref <1.2)
Total Protein: 6.6 g/dL (ref 6.5–8.1)

## 2023-09-14 LAB — URINALYSIS, ROUTINE W REFLEX MICROSCOPIC
Bacteria, UA: NONE SEEN
Bilirubin Urine: NEGATIVE
Glucose, UA: 150 mg/dL — AB
Hgb urine dipstick: NEGATIVE
Ketones, ur: NEGATIVE mg/dL
Leukocytes,Ua: NEGATIVE
Nitrite: NEGATIVE
Protein, ur: >=300 mg/dL — AB
Specific Gravity, Urine: 1.012 (ref 1.005–1.030)
pH: 6 (ref 5.0–8.0)

## 2023-09-14 LAB — SODIUM, URINE, RANDOM: Sodium, Ur: 89 mmol/L

## 2023-09-14 LAB — CREATININE, URINE, RANDOM: Creatinine, Urine: 97 mg/dL

## 2023-09-14 LAB — TROPONIN I (HIGH SENSITIVITY)
Troponin I (High Sensitivity): 62 ng/L — ABNORMAL HIGH (ref <18)
Troponin I (High Sensitivity): 65 ng/L — ABNORMAL HIGH (ref <18)

## 2023-09-14 LAB — CBG MONITORING, ED: Glucose-Capillary: 155 mg/dL — ABNORMAL HIGH (ref 70–99)

## 2023-09-14 LAB — MRSA NEXT GEN BY PCR, NASAL: MRSA by PCR Next Gen: NOT DETECTED

## 2023-09-14 MED ORDER — CARVEDILOL 3.125 MG PO TABS
6.2500 mg | ORAL_TABLET | Freq: Two times a day (BID) | ORAL | Status: DC
  Administered 2023-09-14: 6.25 mg via ORAL
  Filled 2023-09-14: qty 2

## 2023-09-14 MED ORDER — POLYETHYLENE GLYCOL 3350 17 G PO PACK: 17.0000 g | PACK | Freq: Every day | ORAL | Status: DC | PRN

## 2023-09-14 MED ORDER — INSULIN ASPART 100 UNIT/ML IJ SOLN
0.0000 [IU] | Freq: Three times a day (TID) | INTRAMUSCULAR | Status: DC
  Administered 2023-09-15: 2 [IU] via SUBCUTANEOUS
  Administered 2023-09-15 (×2): 1 [IU] via SUBCUTANEOUS
  Administered 2023-09-16: 2 [IU] via SUBCUTANEOUS
  Administered 2023-09-16: 1 [IU] via SUBCUTANEOUS

## 2023-09-14 MED ORDER — HYDRALAZINE HCL 25 MG PO TABS: 25.0000 mg | ORAL_TABLET | Freq: Four times a day (QID) | ORAL | Status: DC

## 2023-09-14 MED ORDER — INSULIN ASPART 100 UNIT/ML IJ SOLN: 0.0000 [IU] | Freq: Every day | INTRAMUSCULAR | Status: DC

## 2023-09-14 MED ORDER — ALBUTEROL SULFATE (2.5 MG/3ML) 0.083% IN NEBU: 2.5000 mg | INHALATION_SOLUTION | RESPIRATORY_TRACT | Status: DC | PRN

## 2023-09-14 MED ORDER — SODIUM CHLORIDE 0.9 % IV BOLUS
250.0000 mL | Freq: Once | INTRAVENOUS | Status: AC
  Administered 2023-09-14: 250 mL via INTRAVENOUS

## 2023-09-14 MED ORDER — NICARDIPINE HCL IN NACL 20-0.86 MG/200ML-% IV SOLN
3.0000 mg/h | INTRAVENOUS | Status: DC
  Administered 2023-09-14 – 2023-09-16 (×4): 5 mg/h via INTRAVENOUS
  Filled 2023-09-14 (×3): qty 200

## 2023-09-14 MED ORDER — ALBUTEROL SULFATE HFA 108 (90 BASE) MCG/ACT IN AERS: 2.0000 | INHALATION_SPRAY | RESPIRATORY_TRACT | Status: DC | PRN

## 2023-09-14 MED ORDER — HYDRALAZINE HCL 20 MG/ML IJ SOLN: 20.0000 mg | Freq: Four times a day (QID) | INTRAMUSCULAR | Status: DC | PRN

## 2023-09-14 MED ORDER — ACETAMINOPHEN 325 MG PO TABS: 650.0000 mg | ORAL_TABLET | Freq: Four times a day (QID) | ORAL | Status: DC | PRN

## 2023-09-14 MED ORDER — HEPARIN SODIUM (PORCINE) 5000 UNIT/ML IJ SOLN
5000.0000 [IU] | Freq: Three times a day (TID) | INTRAMUSCULAR | Status: DC
  Administered 2023-09-15 – 2023-09-16 (×4): 5000 [IU] via SUBCUTANEOUS
  Filled 2023-09-14 (×4): qty 1

## 2023-09-14 MED ORDER — SODIUM CHLORIDE 0.9 % IV BOLUS
1000.0000 mL | Freq: Once | INTRAVENOUS | Status: AC
  Administered 2023-09-14: 1000 mL via INTRAVENOUS

## 2023-09-14 MED ORDER — ACETAMINOPHEN 650 MG RE SUPP: 650.0000 mg | Freq: Four times a day (QID) | RECTAL | Status: DC | PRN

## 2023-09-14 MED ORDER — ROSUVASTATIN CALCIUM 10 MG PO TABS
10.0000 mg | ORAL_TABLET | Freq: Every day | ORAL | Status: DC
  Administered 2023-09-15 – 2023-09-16 (×2): 10 mg via ORAL
  Filled 2023-09-14 (×2): qty 1

## 2023-09-14 MED ORDER — HYDRALAZINE HCL 20 MG/ML IJ SOLN
10.0000 mg | INTRAMUSCULAR | Status: DC | PRN
  Administered 2023-09-14 – 2023-09-15 (×4): 10 mg via INTRAVENOUS
  Filled 2023-09-14 (×4): qty 1

## 2023-09-14 MED ORDER — HYDRALAZINE HCL 20 MG/ML IJ SOLN
10.0000 mg | Freq: Once | INTRAMUSCULAR | Status: AC
  Administered 2023-09-14: 10 mg via INTRAVENOUS
  Filled 2023-09-14: qty 1

## 2023-09-14 NOTE — ED Notes (Signed)
ED Provider at bedside. 

## 2023-09-14 NOTE — ED Notes (Signed)
Pt ambulated to restroom. 

## 2023-09-14 NOTE — H&P (Addendum)
TRH H&P   Patient Demographics:    Luis Wood, is a 52 y.o. male  MRN: [id number]   DOB - 12-Jul-1971  Admit Date - 09/14/2023  Outpatient Primary MD for the patient is Jarold Motto, [place]  Referring MD/NP/PA: Dr Elayne Snare  Outpatient Specialists: seen by [facility] and [facility] last year  Patient coming from: Home  Chief Complaint  Patient presents with   Shortness of Breath      HPI:    Luis Wood  is a 52 y.o. 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 where he started to have dyspnea, he did report some chest tightness as well, and reports symptoms has been going on for last 24 hours without much relief with his MDI, fever, chills, urinary, polyuria, hemoptysis, blood pressure was elevated upon presentation to ED, he reports he has not been taking his amlodipine for last 2 weeks as he did not like side effects which include palpitation and anxiety, by reviewing records, he has missed few appointments with his PCP last couple months, it does document in the past he is supposed to be on amlodipine, carvedilol and olmesartan, apparently he was taking only amlodipine intermittently, did not take at all for last 2 weeks. -In ED blood pressure was significantly elevated 247/120, EKG with evidence of LVH, high-sensitivity troponin ACS pattern trending down 62> 65, ED physician discussed with cardiology, EKG findings most likely related to LVH, and patient can stay at [facility], creatinine elevated at 6.2, most recent 4.37 in May of this year, was at 3.36 of last year.  Hemoglobin low at 9.8, [facility] hospitalist consulted to admit.    Review of systems:     A full 10 point  Review of Systems was done, except as stated above, all other Review of Systems were negative.   With Past History of the following :    Past Medical History:  Diagnosis Date   Asthma    Diabetes mellitus without complication (HCC)    Hypertension       History reviewed. No pertinent surgical history.    Social History:     Social History   Tobacco Use   Smoking status: Never   Smokeless tobacco: Never  Substance Use Topics   Alcohol use: No        Family History :     Family History  Problem Relation Age of Onset   Other Mother        uknown   Hypertension Father    Other Father        rhabdolmylosis   Diabetes Father    Cancer Maternal Grandmother    Cancer Paternal Grandmother    Colon cancer Neg Hx  Colon polyps Neg Hx    Esophageal cancer Neg Hx    Rectal cancer Neg Hx    Stomach cancer Neg Hx       Home Medications:   Prior to Admission medications   Medication Sig Start Date End Date Taking? Authorizing Provider  albuterol (PROVENTIL HFA;VENTOLIN HFA) 108 (90 BASE) MCG/ACT inhaler Inhale 2 puffs into the lungs every 4 (four) hours as needed. For wheezing.    [provider]  amLODipine (NORVASC) 10 MG tablet Take 10 mg by mouth daily. 06/19/22   [provider]  carvedilol (COREG) 6.25 MG tablet Take 6.25 mg by mouth 2 (two) times daily. 08/21/22   [provider]  Cholecalciferol (VITAMIN D3) 50 MCG (2000 UT) capsule Take 2,000 Units by mouth daily. 06/19/22   [provider]  Dulaglutide (TRULICITY) 0.75 MG/0.5ML SOPN Inject 0.75 mg into the skin once a week. 02/07/23   Jarold Motto, PA  ezetimibe (ZETIA) 10 MG tablet Take 1 tablet (10 mg total) by mouth daily. 02/08/23 02/03/24  Jarold Motto, PA  FEROSUL 325 (65 Fe) MG tablet Take 325 mg by mouth daily. 08/21/22   [provider]  hydrALAZINE (APRESOLINE) 50 MG tablet Take 50 mg by mouth 2 (two) times daily. 08/21/22   [provider]   hydrOXYzine (ATARAX) 10 MG tablet Take 1 tablet (10 mg total) by mouth 3 (three) times daily as needed. Patient not taking: Reported on 03/01/2023 10/10/21   Janeece Agee, NP  olmesartan (BENICAR) 20 MG tablet Take 1 tablet (20 mg total) by mouth daily. Patient not taking: Reported on 03/01/2023 09/15/22   Jarold Motto, PA  rosuvastatin (CRESTOR) 10 MG tablet Take 1 tablet (10 mg total) by mouth daily. 02/08/23   Jarold Motto, PA  Semaglutide,0.25 or 0.5MG /DOS, (OZEMPIC, 0.25 OR 0.5 MG/DOSE,) 2 MG/3ML SOPN Inject 0.25 mg into the skin once a week. 05/30/23   Jarold Motto, PA  traZODone (DESYREL) 50 MG tablet Take 0.5-1 tablets (25-50 mg total) by mouth at bedtime as needed for sleep. Patient not taking: Reported on 03/01/2023 10/10/21   Janeece Agee, NP     Allergies:     Allergies  Allergen Reactions   Aspirin Other (See Comments)    Dizziness and fainting, this was a childhood allergy     Physical Exam:   Vitals  Blood pressure (!) 223/105, pulse (!) 102, temperature 98.3 F (36.8 C), resp. rate 16, height 5\' 7"  (1.702 m), weight 102.5 kg, SpO2 99%.   1. General Well-developed male, laying in bed, no apparent distress  2. Normal affect and insight, Not Suicidal or Homicidal, Awake Alert, Oriented X 3.  3. No F.N deficits, ALL C.Nerves Intact, Strength 5/5 all 4 extremities, Sensation intact all 4 extremities, Plantars down going.  4. Ears and Eyes appear Normal, Conjunctivae clear, PERRLA. Moist Oral Mucosa.  5. Supple Neck, No JVD, No cervical lymphadenopathy appriciated, No Carotid Bruits.  6. Symmetrical Chest wall movement, Good air movement bilaterally, CTAB.  7. RRR, No Gallops, Rubs or Murmurs, No Parasternal Heave.  8. Positive Bowel Sounds, Abdomen Soft, No tenderness, No organomegaly appriciated,No rebound -guarding or rigidity.  9.  No Cyanosis, Normal Skin Turgor, No Skin Rash or Bruise.  10. Good muscle tone,  joints appear normal , no effusions,  Normal ROM.     Data Review:    CBC Recent Labs  Lab 09/14/23 1211  WBC 11.1*  HGB 9.8*  HCT 30.5*  PLT 289  MCV 93.8  MCH  30.2  MCHC 32.1  RDW 13.2  LYMPHSABS 1.6  MONOABS 0.6  EOSABS 0.1  BASOSABS 0.1   ------------------------------------------------------------------------------------------------------------------  Chemistries  Recent Labs  Lab 09/14/23 1211  NA 135  K 4.2  CL 103  CO2 22  GLUCOSE 178*  BUN 52*  CREATININE 6.20*  CALCIUM 8.0*  AST 18  ALT 17  ALKPHOS 67  BILITOT 0.5   ------------------------------------------------------------------------------------------------------------------ estimated creatinine clearance is 16.3 mL/min (A) (by C-G formula based on SCr of 6.2 mg/dL (H)). ------------------------------------------------------------------------------------------------------------------ No results for input(s): "TSH", "T4TOTAL", "T3FREE", "THYROIDAB" in the last 72 hours.  Invalid input(s): "FREET3"  Coagulation profile No results for input(s): "INR", "PROTIME" in the last 168 hours. ------------------------------------------------------------------------------------------------------------------- No results for input(s): "DDIMER" in the last 72 hours. -------------------------------------------------------------------------------------------------------------------  Cardiac Enzymes No results for input(s): "CKMB", "TROPONINI", "MYOGLOBIN" in the last 168 hours.  Invalid input(s): "CK" ------------------------------------------------------------------------------------------------------------------ No results found for: "BNP"   ---------------------------------------------------------------------------------------------------------------  Urinalysis    Component Value Date/Time   COLORURINE YELLOW 08/21/2011 1831   APPEARANCEUR CLEAR 08/21/2011 1831   LABSPEC 1.025 08/21/2011 1831   PHURINE 5.5 08/21/2011 1831   GLUCOSEU  >1000 (A) 08/21/2011 1831   HGBUR TRACE (A) 08/21/2011 1831   BILIRUBINUR NEGATIVE 08/21/2011 1831   KETONESUR TRACE (A) 08/21/2011 1831   PROTEINUR TRACE (A) 08/21/2011 1831   UROBILINOGEN 0.2 08/21/2011 1831   NITRITE NEGATIVE 08/21/2011 1831   LEUKOCYTESUR NEGATIVE 08/21/2011 1831    ----------------------------------------------------------------------------------------------------------------   Imaging Results:    DG Chest 2 View Result Date: 09/14/2023 CLINICAL DATA:  Shortness of breath. EXAM: CHEST - 2 VIEW COMPARISON:  October 09, 2012. FINDINGS: The heart size and mediastinal contours are within normal limits. Bilateral interstitial prominence and peribronchial cuffing. No focal consolidation. No pleural effusion or pneumothorax. No acute osseous abnormality. IMPRESSION: Bilateral interstitial prominence and peribronchial cuffing most concerning for bronchitis. No focal consolidation. Electronically Signed   By: Hart Robinsons M.D.   On: 09/14/2023 14:09     EKG:  PR interval 157 ms QRS duration 99 ms QT/QTcB 356/455 ms P-R-T axes 73 1 209 Sinus rhythm Abnormal R-wave progression, early transition LVH with secondary repolarization abnormality  Assessment & Plan:    Principal Problem:   Hypertensive emergency Active Problems:   Essential hypertension   Asthma   Type 2 diabetes mellitus with diabetic chronic kidney disease (HCC)   Hyperlipidemia associated with type 2 diabetes mellitus (HCC)   Obesity   Vitamin D deficiency   CKD (chronic kidney disease) stage 4, GFR 15-29 ml/min (HCC)    Hypertensive emergency -Blood pressure severely elevated to 247/120, endorgan damage as evidenced by AKI.  And chest pain dyspnea -Noncompliance with medication, reports he is only supposed to be on amlodipine which she has not been taking given side effects including palpitation and anxiety -He will be admitted to ICU, he will be started on nicardipine drip, with target blood  pressure lowering 20 to 25% initially. -Will start on low-dose oral regimen, preferably beta-blockers, hydralazine, uptitrate dose gradually hoping to wean off nicardipine drip 24 to 48 hours.  Addendum 9:31 PM: -With near syncopal event upon standing Prenate, appears blood pressure has dropped significantly, 138/72, he was laid supine, symptoms has resolved, before starting fluid bolus blood pressure has been up to 166/89, no further symptoms, we will try to keep off nicardipine drip if possible, will continue with scheduled Coreg, hydralazine and as needed IV hydralazine.  .    AKI on  CKD stage IV -Patient has progressive function  over the last year.  This likely in setting of uncontrolled blood pressure -Will obtain renal ultrasound -Will check bladder scan-check UA, urine creatinine and urine sodium -Will consult renal -No hyperkalemia, no uremia  Chest tightness -Most likely related to his hypertensive emergency, ED discussed EKG findings with cardiology, is related to his hypertension, troponins elevated in the setting of hypertensive emergency and chronic kidney disease, non-ACS pattern. -Will obtain 2D echo  Diabetes mellitus, type II -patient reports he is on Ozempic reports his last A1c was 6.1, will check A1c -Will keep on insulin sliding scale during hospital stay   Vitamin D deficiency -Continue with supplements  Asthma - presents with dyspnea, had some wheezing, resolved with as needed albuterol, will continue as needed  Obsesity class II -Body mass index is 35.4 kg/m. -He is on Ozempic  hyperlipidemia -Will check lipid panel -Continue with Crestor  Anemia of chronic kidney disease -hemoglobin at baseline    DVT Prophylaxis Heparin   AM Labs Ordered, also please review Full Orders  Family Communication: Admission, patients condition and plan of care including tests being ordered have been discussed with the patient  who indicate understanding and agree  with the plan and Code Status.  Code Status full code  Likely DC to home  Consults called: Nephrology consult requested in epic  Admission status: Inpatient  Time spent in minutes : 70 minutes   Huey Bienenstock M.D on 09/14/2023 at 7:28 PM   [facility] - Office  [phone]

## 2023-09-14 NOTE — ED Notes (Signed)
Hospitalist at bedside 

## 2023-09-14 NOTE — ED Provider Notes (Signed)
[facility] AT [facility] Provider Note   CSN: [id number] Arrival date & time: 09/14/23  1145     History  Chief Complaint  Patient presents with   Shortness of Breath    Luis Wood is a 52 y.o. male.  With history of hypertension, asthma, CKD and type 2 diabetes who presents to the ED for shortness of breath.  He reports that his shortness of breath and chest discomfort began yesterday and has persisted since the onset.  He describes the chest discomfort as a tightness made worse with deep breaths.  He uses albuterol MDI at home which has not helped much.  Patient notes he has not been compliant with his amlodipine at home because of adverse effects   Shortness of Breath      Home Medications Prior to Admission medications   Medication Sig Start Date End Date Taking? Authorizing Provider  albuterol (PROVENTIL HFA;VENTOLIN HFA) 108 (90 BASE) MCG/ACT inhaler Inhale 2 puffs into the lungs every 4 (four) hours as needed. For wheezing.    [provider]  amLODipine (NORVASC) 10 MG tablet Take 10 mg by mouth daily. 06/19/22   [provider]  carvedilol (COREG) 6.25 MG tablet Take 6.25 mg by mouth 2 (two) times daily. 08/21/22   [provider]  Cholecalciferol (VITAMIN D3) 50 MCG (2000 UT) capsule Take 2,000 Units by mouth daily. 06/19/22   [provider]  Dulaglutide (TRULICITY) 0.75 MG/0.5ML SOPN Inject 0.75 mg into the skin once a week. 02/07/23   Jarold Motto, PA  ezetimibe (ZETIA) 10 MG tablet Take 1 tablet (10 mg total) by mouth daily. 02/08/23 02/03/24  Jarold Motto, PA  FEROSUL 325 (65 Fe) MG tablet Take 325 mg by mouth daily. 08/21/22   [provider]  hydrALAZINE (APRESOLINE) 50 MG tablet Take 50 mg by mouth 2 (two) times daily. 08/21/22   [provider]  hydrOXYzine (ATARAX) 10 MG tablet Take 1 tablet (10 mg total) by mouth 3 (three) times daily as needed. Patient not taking:  Reported on 03/01/2023 10/10/21   Janeece Agee, NP  olmesartan (BENICAR) 20 MG tablet Take 1 tablet (20 mg total) by mouth daily. Patient not taking: Reported on 03/01/2023 09/15/22   Jarold Motto, PA  rosuvastatin (CRESTOR) 10 MG tablet Take 1 tablet (10 mg total) by mouth daily. 02/08/23   Jarold Motto, PA  Semaglutide,0.25 or 0.5MG /DOS, (OZEMPIC, 0.25 OR 0.5 MG/DOSE,) 2 MG/3ML SOPN Inject 0.25 mg into the skin once a week. 05/30/23   Jarold Motto, PA  traZODone (DESYREL) 50 MG tablet Take 0.5-1 tablets (25-50 mg total) by mouth at bedtime as needed for sleep. Patient not taking: Reported on 03/01/2023 10/10/21   Janeece Agee, NP      Allergies    Aspirin    Review of Systems   Review of Systems  Respiratory:  Positive for shortness of breath.     Physical Exam Updated Vital Signs BP (!) 231/111   Pulse (!) 106   Temp 98.2 F (36.8 C) (Oral)   Resp 20   Ht 5\' 7"  (1.702 m)   Wt 102.5 kg   SpO2 99%   BMI 35.40 kg/m  Physical Exam Vitals and nursing note reviewed.  HENT:     Head: Normocephalic and atraumatic.  Eyes:     Pupils: Pupils are equal, round, and reactive to light.  Cardiovascular:     Rate and Rhythm: Normal rate and regular rhythm.  Pulmonary:  Effort: Pulmonary effort is normal. No respiratory distress.     Breath sounds: Normal breath sounds. No wheezing.  Abdominal:     Palpations: Abdomen is soft.     Tenderness: There is no abdominal tenderness.  Musculoskeletal:     Right lower leg: No edema.     Left lower leg: No edema.  Skin:    General: Skin is warm and dry.  Neurological:     Mental Status: He is alert.  Psychiatric:        Mood and Affect: Mood normal.     ED Results / Procedures / Treatments   Labs (all labs ordered are listed, but only abnormal results are displayed) Labs Reviewed  CBC WITH DIFFERENTIAL/PLATELET - Abnormal; Notable for the following components:      Result Value   WBC 11.1 (*)    RBC 3.25 (*)     Hemoglobin 9.8 (*)    HCT 30.5 (*)    Neutro Abs 8.7 (*)    All other components within normal limits  COMPREHENSIVE METABOLIC PANEL - Abnormal; Notable for the following components:   Glucose, Bld 178 (*)    BUN 52 (*)    Creatinine, Ser 6.20 (*)    Calcium 8.0 (*)    Albumin 3.2 (*)    GFR, Estimated 10 (*)    All other components within normal limits  TROPONIN I (HIGH SENSITIVITY) - Abnormal; Notable for the following components:   Troponin I (High Sensitivity) 62 (*)    All other components within normal limits  TROPONIN I (HIGH SENSITIVITY) - Abnormal; Notable for the following components:   Troponin I (High Sensitivity) 65 (*)    All other components within normal limits    EKG EKG Interpretation Date/Time:  Friday September 14 2023 16:39:47 EST Ventricular Rate:  98 PR Interval:  157 QRS Duration:  99 QT Interval:  356 QTC Calculation: 455 R Axis:   1  Text Interpretation: Sinus rhythm Abnormal R-wave progression, early transition LVH with secondary repolarization abnormality Confirmed by Estelle June [phone]) on 09/14/2023 7:05:57 PM  Radiology DG Chest 2 View Result Date: 09/14/2023 CLINICAL DATA:  Shortness of breath. EXAM: CHEST - 2 VIEW COMPARISON:  October 09, 2012. FINDINGS: The heart size and mediastinal contours are within normal limits. Bilateral interstitial prominence and peribronchial cuffing. No focal consolidation. No pleural effusion or pneumothorax. No acute osseous abnormality. IMPRESSION: Bilateral interstitial prominence and peribronchial cuffing most concerning for bronchitis. No focal consolidation. Electronically Signed   By: Hart Robinsons M.D.   On: 09/14/2023 14:09    Procedures Procedures    Medications Ordered in ED Medications  albuterol (VENTOLIN HFA) 108 (90 Base) MCG/ACT inhaler 2 puff (has no administration in time range)  nicardipine (CARDENE) 20mg  in 0.86% saline IV infusion (0.1 mg/ml) (has no administration in time  range)  hydrALAZINE (APRESOLINE) injection 10 mg (10 mg Intravenous Given 09/14/23 1751)  sodium chloride 0.9 % bolus 1,000 mL (1,000 mLs Intravenous New Bag/Given 09/14/23 1752)    ED Course/ Medical Decision Making/ A&P Clinical Course as of 09/14/23 1906  Fri Sep 14, 2023  1902 High-sensitivity troponin initially of 62 with delta of 65.  EKG shows LVH with repolarization but no ischemic changes or dysrhythmia.  Discussed with cardiology who does not feel this represents NSTEMI low suspicion for primary cardiac event.  Metabolic panel notable for significantly elevated BUN/creatinine from baseline concerning for uncontrolled hypertension and acute renal failure.  Will provide IV fluids and give  hydralazine 10 mg to see if this helps with his blood pressure.  No focal consolidation on chest x-ray.  Patient's breathing remains clear at this time not requiring nebulizer treatments.  His chest tightness may be in setting of severe hypertension and have less to do with of his asthma.  Discussed admitting hospitalist accept patient for admission [MP]  1904 Hydralazine did not provide any improvement of blood pressure.  Hospitalist will start nicardipine drip [MP]    Clinical Course User Index [MP] Royanne Foots, DO                                 Medical Decision Making 52 year old male with history as above presenting for shortness of breath and chest tightness x 2 days.  Albuterol does not help much at home.  Chest tightness made worse with dry cough.  No fevers chills or URI symptoms.  Vital signs notable for tachycardia and significantly elevated blood pressure with systolic above 200s.  Has been noncompliant with amlodipine at home.  Differential diagnosis includes ACS, dysrhythmia, pneumonia, viral URI, asthma exacerbation and acute heart failure.  Will obtain S3 troponin, EKG, chest x-ray and laboratory workup.  Amount and/or Complexity of Data Reviewed Labs: ordered. Radiology:  ordered.  Risk Prescription drug management. Decision regarding hospitalization.           Final Clinical Impression(s) / ED Diagnoses Final diagnoses:  Hypertensive emergency  Acute kidney injury (HCC)  Chronic kidney disease, unspecified CKD stage    Rx / DC Orders ED Discharge Orders     None         Royanne Foots, DO 09/14/23 1906

## 2023-09-14 NOTE — ED Notes (Signed)
ED TO INPATIENT HANDOFF REPORT  ED Nurse Name and Phone #: [phone]  S Name/Age/Gender Luis Wood 52 y.o. male Room/Bed: APA01/APA01  Code Status   Code Status: Full Code  Home/SNF/Other Home Patient oriented to: self, place, time, and situation Is this baseline? Yes   Triage Complete: Triage complete  Chief Complaint Hypertensive emergency [I16.1]  Triage Note SOB and chest tightness started yesterday  Heavy breathing and SOB and horse talking every time he enters a room that was recently remodeled (new floors).  Hx of asthma, inhalers not helping at home    Allergies Allergies  Allergen Reactions   Aspirin Other (See Comments)    Dizziness and fainting, this was a childhood allergy    Level of Care/Admitting Diagnosis ED Disposition     ED Disposition  Admit   Condition  --   Comment  Hospital Area: [facility] [[id number]]  Level of Care: ICU [6]  Covid Evaluation: Asymptomatic - no recent exposure (last 10 days) testing not required  Diagnosis: Hypertensive emergency [454098]  Admitting Physician: Chiquita Loth  Attending Physician: Chiquita Loth  Certification:: I certify this patient will need inpatient services for at least 2 midnights  Expected Medical Readiness: 09/16/2023          B Medical/Surgery History Past Medical History:  Diagnosis Date   Asthma    Diabetes mellitus without complication (HCC)    Hypertension    History reviewed. No pertinent surgical history.   A IV Location/Drains/Wounds Patient Lines/Drains/Airways Status     Active Line/Drains/Airways     Name Placement date Placement time Site Days   Peripheral IV 09/14/23 20 G Left Antecubital 09/14/23  1748  Antecubital  less than 1            Intake/Output Last 24 hours No intake or output data in the 24 hours ending 09/14/23 1948  Labs/Imaging Results for orders placed or performed during the hospital encounter of  09/14/23 (from the past 48 hours)  CBC with Differential     Status: Abnormal   Collection Time: 09/14/23 12:11 PM  Result Value Ref Range   WBC 11.1 (H) 4.0 - 10.5 K/uL   RBC 3.25 (L) 4.22 - 5.81 MIL/uL   Hemoglobin 9.8 (L) 13.0 - 17.0 g/dL   HCT 11.9 (L) 14.7 - 82.9 %   MCV 93.8 80.0 - 100.0 fL   MCH 30.2 26.0 - 34.0 pg   MCHC 32.1 30.0 - 36.0 g/dL   RDW 56.2 13.0 - 86.5 %   Platelets 289 150 - 400 K/uL   nRBC 0.0 0.0 - 0.2 %   Neutrophils Relative % 78 %   Neutro Abs 8.7 (H) 1.7 - 7.7 K/uL   Lymphocytes Relative 14 %   Lymphs Abs 1.6 0.7 - 4.0 K/uL   Monocytes Relative 6 %   Monocytes Absolute 0.6 0.1 - 1.0 K/uL   Eosinophils Relative 1 %   Eosinophils Absolute 0.1 0.0 - 0.5 K/uL   Basophils Relative 1 %   Basophils Absolute 0.1 0.0 - 0.1 K/uL   Immature Granulocytes 0 %   Abs Immature Granulocytes 0.04 0.00 - 0.07 K/uL    Comment: Performed at [facility], [address] [place], [place] [address]  Comprehensive metabolic panel     Status: Abnormal   Collection Time: 09/14/23 12:11 PM  Result Value Ref Range   Sodium 135 135 - 145 mmol/L   Potassium 4.2 3.5 - 5.1 mmol/L  Chloride 103 98 - 111 mmol/L   CO2 22 22 - 32 mmol/L   Glucose, Bld 178 (H) 70 - 99 mg/dL    Comment: Glucose reference range applies only to samples taken after fasting for at least 8 hours.   BUN 52 (H) 6 - 20 mg/dL   Creatinine, Ser 4.33 (H) 0.61 - 1.24 mg/dL   Calcium 8.0 (L) 8.9 - 10.3 mg/dL   Total Protein 6.6 6.5 - 8.1 g/dL   Albumin 3.2 (L) 3.5 - 5.0 g/dL   AST 18 15 - 41 U/L   ALT 17 0 - 44 U/L   Alkaline Phosphatase 67 38 - 126 U/L   Total Bilirubin 0.5 <1.2 mg/dL   GFR, Estimated 10 (L) >60 mL/min    Comment: (NOTE) Calculated using the CKD-EPI Creatinine Equation (2021)    Anion gap 10 5 - 15    Comment: Performed at [facility], [address], [place], [place] [address]  Troponin I (High Sensitivity)     Status: Abnormal   Collection Time: 09/14/23 12:11 PM  Result Value Ref  Range   Troponin I (High Sensitivity) 62 (H) <18 ng/L    Comment: (NOTE) Elevated high sensitivity troponin I (hsTnI) values and significant  changes across serial measurements may suggest ACS but many other  chronic and acute conditions are known to elevate hsTnI results.  Refer to the "Links" section for chest pain algorithms and additional  guidance. Performed at [facility], [address], [place], [place] [address]   Troponin I (High Sensitivity)     Status: Abnormal   Collection Time: 09/14/23  2:28 PM  Result Value Ref Range   Troponin I (High Sensitivity) 65 (H) <18 ng/L    Comment: (NOTE) Elevated high sensitivity troponin I (hsTnI) values and significant  changes across serial measurements may suggest ACS but many other  chronic and acute conditions are known to elevate hsTnI results.  Refer to the "Links" section for chest pain algorithms and additional  guidance. Performed at [facility], [address] [place], [place] [address]    DG Chest 2 View Result Date: 09/14/2023 CLINICAL DATA:  Shortness of breath. EXAM: CHEST - 2 VIEW COMPARISON:  October 09, 2012. FINDINGS: The heart size and mediastinal contours are within normal limits. Bilateral interstitial prominence and peribronchial cuffing. No focal consolidation. No pleural effusion or pneumothorax. No acute osseous abnormality. IMPRESSION: Bilateral interstitial prominence and peribronchial cuffing most concerning for bronchitis. No focal consolidation. Electronically Signed   By: Hart Robinsons M.D.   On: 09/14/2023 14:09    Pending Labs Unresulted Labs (From admission, onward)     Start     Ordered   09/15/23 0500  HIV Antibody (routine testing w rflx)  (HIV Antibody (Routine testing w reflex) panel)  Tomorrow morning,   R        09/14/23 1923   09/15/23 0500  Basic metabolic panel  Tomorrow morning,   R        09/14/23 1923   09/15/23 0500  CBC  Tomorrow morning,   R        09/14/23 1923   09/15/23 0500   Lipid panel  Tomorrow morning,   R        09/14/23 1924   09/14/23 1927  Sodium, urine, random  Once,   R        09/14/23 1926   09/14/23 1927  Creatinine, urine, random  Once,   R  09/14/23 1926   09/14/23 1925  Hemoglobin A1c  Add-on,   AD       Comments: To assess prior glycemic control    09/14/23 1924   09/14/23 1909  Urinalysis, Routine w reflex microscopic -Urine, Clean Catch  Once,   R       Question:  Specimen Source  Answer:  Urine, Clean Catch   09/14/23 1909            Vitals/Pain Today's Vitals   09/14/23 1900 09/14/23 1923 09/14/23 1930 09/14/23 1940  BP: (!) 231/111 (!) 223/105 (!) 204/111 (!) 225/97  Pulse: (!) 106 (!) 102 (!) 105 (!) 120  Resp: 20 16 13  (!) 22  Temp:  98.3 F (36.8 C)    TempSrc:      SpO2: 99% 99% 97% 100%  Weight:      Height:      PainSc:        Isolation Precautions No active isolations  Medications Medications  nicardipine (CARDENE) 20mg  in 0.86% saline IV infusion (0.1 mg/ml) (7.5 mg/hr Intravenous Rate/Dose Change 09/14/23 1934)  heparin injection 5,000 Units (has no administration in time range)  acetaminophen (TYLENOL) tablet 650 mg (has no administration in time range)    Or  acetaminophen (TYLENOL) suppository 650 mg (has no administration in time range)  polyethylene glycol (MIRALAX / GLYCOLAX) packet 17 g (has no administration in time range)  albuterol (PROVENTIL) (2.5 MG/3ML) 0.083% nebulizer solution 2.5 mg (has no administration in time range)  insulin aspart (novoLOG) injection 0-5 Units (has no administration in time range)  insulin aspart (novoLOG) injection 0-9 Units (has no administration in time range)  rosuvastatin (CRESTOR) tablet 10 mg (has no administration in time range)  hydrALAZINE (APRESOLINE) injection 10 mg (10 mg Intravenous Given 09/14/23 1751)  sodium chloride 0.9 % bolus 1,000 mL (0 mLs Intravenous Stopped 09/14/23 1919)    Mobility walks     Focused Assessments Cardiac  Assessment Handoff:    No results found for: "CKTOTAL", "CKMB", "CKMBINDEX", "TROPONINI" No results found for: "DDIMER" Does the Patient currently have chest pain? No    R Recommendations: See Admitting Provider Note  Report given to:   Additional Notes: Pt has been non compliant with blood pressure medication, stating "he didn't take all his prescribed medication because he felt it was too much". Pt spoke with hospitalist who explained the importance of taking prescribed medication and the ramifications of being non compliant. Pt appears to be anxious and verbalizes that he is frustrated with himself.

## 2023-09-14 NOTE — ED Triage Notes (Signed)
SOB and chest tightness started yesterday  Heavy breathing and SOB and horse talking every time he enters a room that was recently remodeled (new floors).  Hx of asthma, inhalers not helping at home

## 2023-09-15 ENCOUNTER — Inpatient Hospital Stay (HOSPITAL_COMMUNITY): Payer: BC Managed Care – PPO

## 2023-09-15 ENCOUNTER — Other Ambulatory Visit (HOSPITAL_COMMUNITY): Payer: Self-pay | Admitting: *Deleted

## 2023-09-15 DIAGNOSIS — I10 Essential (primary) hypertension: Secondary | ICD-10-CM | POA: Insufficient documentation

## 2023-09-15 DIAGNOSIS — N184 Chronic kidney disease, stage 4 (severe): Secondary | ICD-10-CM

## 2023-09-15 DIAGNOSIS — R0609 Other forms of dyspnea: Secondary | ICD-10-CM

## 2023-09-15 DIAGNOSIS — N179 Acute kidney failure, unspecified: Secondary | ICD-10-CM

## 2023-09-15 DIAGNOSIS — D631 Anemia in chronic kidney disease: Secondary | ICD-10-CM

## 2023-09-15 DIAGNOSIS — E66812 Obesity, class 2: Secondary | ICD-10-CM | POA: Diagnosis not present

## 2023-09-15 DIAGNOSIS — N185 Chronic kidney disease, stage 5: Secondary | ICD-10-CM

## 2023-09-15 LAB — CBC
HCT: 30.7 % — ABNORMAL LOW (ref 39.0–52.0)
Hemoglobin: 9.8 g/dL — ABNORMAL LOW (ref 13.0–17.0)
MCH: 30.1 pg (ref 26.0–34.0)
MCHC: 31.9 g/dL (ref 30.0–36.0)
MCV: 94.2 fL (ref 80.0–100.0)
Platelets: 268 10*3/uL (ref 150–400)
RBC: 3.26 MIL/uL — ABNORMAL LOW (ref 4.22–5.81)
RDW: 13.3 % (ref 11.5–15.5)
WBC: 10.7 10*3/uL — ABNORMAL HIGH (ref 4.0–10.5)
nRBC: 0 % (ref 0.0–0.2)

## 2023-09-15 LAB — ECHOCARDIOGRAM COMPLETE
Area-P 1/2: 3.91 cm2
Height: 67 in
S' Lateral: 3.1 cm
Weight: 3587.33 [oz_av]

## 2023-09-15 LAB — GLUCOSE, CAPILLARY
Glucose-Capillary: 149 mg/dL — ABNORMAL HIGH (ref 70–99)
Glucose-Capillary: 157 mg/dL — ABNORMAL HIGH (ref 70–99)
Glucose-Capillary: 128 mg/dL — ABNORMAL HIGH (ref 70–99)
Glucose-Capillary: 153 mg/dL — ABNORMAL HIGH (ref 70–99)

## 2023-09-15 LAB — IRON AND TIBC
Iron: 62 ug/dL (ref 45–182)
Saturation Ratios: 21 % (ref 17.9–39.5)
TIBC: 299 ug/dL (ref 250–450)
UIBC: 237 ug/dL

## 2023-09-15 LAB — BASIC METABOLIC PANEL
Anion gap: 12 (ref 5–15)
BUN: 49 mg/dL — ABNORMAL HIGH (ref 6–20)
CO2: 19 mmol/L — ABNORMAL LOW (ref 22–32)
Calcium: 8.4 mg/dL — ABNORMAL LOW (ref 8.9–10.3)
Chloride: 107 mmol/L (ref 98–111)
Creatinine, Ser: 6.09 mg/dL — ABNORMAL HIGH (ref 0.61–1.24)
GFR, Estimated: 10 mL/min — ABNORMAL LOW (ref >60)
Glucose, Bld: 173 mg/dL — ABNORMAL HIGH (ref 70–99)
Potassium: 4.3 mmol/L (ref 3.5–5.1)
Sodium: 138 mmol/L (ref 135–145)

## 2023-09-15 LAB — LIPID PANEL
Cholesterol: 300 mg/dL — ABNORMAL HIGH (ref 0–200)
HDL: 49 mg/dL (ref >40)
LDL Cholesterol: 230 mg/dL — ABNORMAL HIGH (ref 0–99)
Total CHOL/HDL Ratio: 6.1 {ratio}
Triglycerides: 105 mg/dL (ref <150)
VLDL: 21 mg/dL (ref 0–40)

## 2023-09-15 LAB — RAPID URINE DRUG SCREEN, HOSP PERFORMED
Amphetamines: NOT DETECTED
Barbiturates: NOT DETECTED
Benzodiazepines: NOT DETECTED
Cocaine: NOT DETECTED
Opiates: NOT DETECTED
Tetrahydrocannabinol: NOT DETECTED

## 2023-09-15 LAB — PROTEIN / CREATININE RATIO, URINE
Creatinine, Urine: 120 mg/dL
Protein Creatinine Ratio: 3.19 mg/mg{creat} — ABNORMAL HIGH (ref 0.00–0.15)
Total Protein, Urine: 383 mg/dL

## 2023-09-15 LAB — FERRITIN: Ferritin: 125 ng/mL (ref 24–336)

## 2023-09-15 LAB — HIV ANTIBODY (ROUTINE TESTING W REFLEX): HIV Screen 4th Generation wRfx: NONREACTIVE

## 2023-09-15 LAB — FOLATE: Folate: 7.8 ng/mL (ref >5.9)

## 2023-09-15 LAB — HEMOGLOBIN A1C
Hgb A1c MFr Bld: 5.5 % (ref 4.8–5.6)
Mean Plasma Glucose: 111.15 mg/dL

## 2023-09-15 LAB — VITAMIN B12: Vitamin B-12: 493 pg/mL (ref 180–914)

## 2023-09-15 MED ORDER — LABETALOL HCL 5 MG/ML IV SOLN
5.0000 mg | Freq: Once | INTRAVENOUS | Status: AC | PRN
  Administered 2023-09-15: 5 mg via INTRAVENOUS
  Filled 2023-09-15: qty 4

## 2023-09-15 MED ORDER — HYDRALAZINE HCL 50 MG PO TABS
50.0000 mg | ORAL_TABLET | Freq: Three times a day (TID) | ORAL | Status: DC
  Administered 2023-09-15 – 2023-09-16 (×2): 50 mg via ORAL
  Filled 2023-09-15 (×2): qty 1

## 2023-09-15 MED ORDER — HYDRALAZINE HCL 25 MG PO TABS
25.0000 mg | ORAL_TABLET | Freq: Three times a day (TID) | ORAL | Status: DC
  Administered 2023-09-15 (×2): 25 mg via ORAL
  Filled 2023-09-15 (×2): qty 1

## 2023-09-15 MED ORDER — CARVEDILOL 3.125 MG PO TABS
6.2500 mg | ORAL_TABLET | Freq: Once | ORAL | Status: AC
  Administered 2023-09-15: 6.25 mg via ORAL
  Filled 2023-09-15: qty 2

## 2023-09-15 MED ORDER — CARVEDILOL 3.125 MG PO TABS
6.2500 mg | ORAL_TABLET | Freq: Two times a day (BID) | ORAL | Status: DC
  Administered 2023-09-15 (×2): 6.25 mg via ORAL
  Filled 2023-09-15 (×2): qty 2

## 2023-09-15 MED ORDER — CHLORHEXIDINE GLUCONATE CLOTH 2 % EX PADS: 6.0000 | MEDICATED_PAD | Freq: Every day | CUTANEOUS | Status: DC

## 2023-09-15 MED ORDER — SODIUM CHLORIDE 0.9 % IV SOLN: INTRAVENOUS | Status: AC

## 2023-09-15 MED ORDER — CARVEDILOL 12.5 MG PO TABS: 12.5000 mg | ORAL_TABLET | Freq: Two times a day (BID) | ORAL | Status: DC

## 2023-09-15 NOTE — Progress Notes (Signed)
   09/15/23 1645  TOC Brief Assessment  Insurance and Status Reviewed  Patient has primary care physician Yes  Home environment has been reviewed From home  Prior level of function: Independent  Prior/Current Home Services No current home services  Social Drivers of Health Review SDOH reviewed no interventions necessary  Readmission risk has been reviewed Yes (Yellow)  Transition of care needs no transition of care needs at this time     Transition of Care Department [facility]) has reviewed patient and no TOC needs have been identified at this time. We will continue to monitor patient advancement through interdisciplinary progression rounds. If new patient transition needs arise, please place a TOC consult.

## 2023-09-15 NOTE — Plan of Care (Signed)
  Problem: Education: Goal: Knowledge of General Education information will improve Description: Including pain rating scale, medication(s)/side effects and non-pharmacologic comfort measures Outcome: Progressing   Problem: Clinical Measurements: Goal: Will remain free from infection Outcome: Progressing   Problem: Pain Management: Goal: General experience of comfort will improve Outcome: Progressing   Problem: Safety: Goal: Ability to remain free from injury will improve Outcome: Progressing   Problem: Skin Integrity: Goal: Risk for impaired skin integrity will decrease Outcome: Progressing   Problem: Coping: Goal: Ability to adjust to condition or change in health will improve Outcome: Progressing   Problem: Skin Integrity: Goal: Risk for impaired skin integrity will decrease Outcome: Progressing   Problem: Tissue Perfusion: Goal: Adequacy of tissue perfusion will improve Outcome: Progressing

## 2023-09-15 NOTE — Plan of Care (Signed)
  Problem: Education: Goal: Knowledge of General Education information will improve Description: Including pain rating scale, medication(s)/side effects and non-pharmacologic comfort measures Outcome: Progressing   Problem: Clinical Measurements: Goal: Ability to maintain clinical measurements within normal limits will improve Outcome: Progressing   Problem: Skin Integrity: Goal: Risk for impaired skin integrity will decrease Outcome: Progressing   

## 2023-09-15 NOTE — Hospital Course (Addendum)
52 year old male with a history of hypertension, hyperlipidemia, diabetes mellitus type 2, CKD, and asthma presenting with 1 to 2-day history of chest discomfort and shortness of breath.  The patient states that this would occur at rest as well as exertion.  He denied any coughing, hemoptysis, fevers, chills.  He denies any headache, nausea, vomiting or diarrhea, abdominal pain.  He states his appetite has been good. He denies any NSAIDs or over-the-counter medications. The patient had a previous history of regular alcohol use but stopped in 2021.  He used to smoke cigars regularly, but he stopped in 2021.  He denies any illicit drugs. He states that he has not taken his amlodipine for last 2 weeks.  Prior to that, he endorsed compliance.  He feels that the amlodipine was causing him anxiety and palpitations  In the ED, the patient was afebrile and hemodynamically stable.  Blood pressure was up to 247/120.  Oxygen saturation was 99% room air.  WBC 11.1, hemoglobin 9.8, platelets 289.  Sodium 135, potassium 4.2, bicarbonate 22, serum creatinine 6.20. The patient was started on a nicardipine drip initially.  He had an episode of near syncope.  This was discontinued and he was started on oral antihypertensive medications and followed closely.

## 2023-09-15 NOTE — Progress Notes (Signed)
PROGRESS NOTE  Luis Wood WUJ:[id number] DOB: 09/30/1970 DOA: 09/14/2023 PCP: Jarold Motto, PA  Brief History:  52 year old male with a history of hypertension, hyperlipidemia, diabetes mellitus type 2, CKD, and asthma presenting with 1 to 2-day history of chest discomfort and shortness of breath.  The patient states that this would occur at rest as well as exertion.  He denied any coughing, hemoptysis, fevers, chills.  He denies any headache, nausea, vomiting or diarrhea, abdominal pain.  He states his appetite has been good. He denies any NSAIDs or over-the-counter medications. The patient had a previous history of regular alcohol use but stopped in 2021.  He used to smoke cigars regularly, but he stopped in 2021.  He denies any illicit drugs. He states that he has not taken his amlodipine for last 2 weeks.  Prior to that, he endorsed compliance.  He feels that the amlodipine was causing him anxiety and palpitations  In the ED, the patient was afebrile and hemodynamically stable.  Blood pressure was up to 247/120.  Oxygen saturation was 99% room air.  WBC 11.1, hemoglobin 9.8, platelets 289.  Sodium 135, potassium 4.2, bicarbonate 22, serum creatinine 6.20. The patient was started on a nicardipine drip initially.  He had an episode of near syncope.  This was discontinued and he was started on oral antihypertensive medications and followed closely.   Assessment/Plan: Hypertensive urgency/malignant hypertension -Initially on nicardipine drip -Transition to oral antihypertensive medications -Check UDS -start coreg and hydralazine -urine protein creatinine ratio  Acute on chronic renal failure--CKD4 -Patient has serum creatinine 4.37 on Feb 07, 2023 -Likely has progression of underlying CKD -Presented with serum creatinine 6.20 -Judicious IV fluids for next 12 hours -Repeat BMP -Renal ultrasound -case discussed with renal--can dc for outpt follow up if electrolytes  and serum creatinine remains  Anemia of CKD -Check iron studies -Baseline creatinine~9  Hyperlipidemia -Continue statin -Total cholesterol 300, LDL 230,TG 105  Elevated troponin/chest discomfort -Secondary to elevated BP -Chest discomfort is resolved with controlling blood pressure -Troponin 62>> 65 -Echocardiogram  Diabetes mellitus type 2 with hyperglycemia -Check hemoglobin A1c -NovoLog sliding scale -Holding Trulicity        Family Communication:   no Family at bedside  Consultants:  none  Code Status:  FULL   DVT Prophylaxis:  [place] Heparin    Procedures: As Listed in Progress Note Above  Antibiotics: None        Subjective: Patient denies fevers, chills, headache, chest pain, dyspnea, nausea, vomiting, diarrhea, abdominal pain, dysuria, hematuria, hematochezia, and melena.   Objective: Vitals:   09/15/23 0600 09/15/23 0630 09/15/23 0637 09/15/23 0700  BP: (!) 197/88 (!) 204/98 (!) 214/102 (!) 179/102  Pulse: (!) 103 97 (!) 101 (!) 105  Resp: 19 15 (!) 9 18  Temp:      TempSrc:      SpO2: 96% 98% 99% 98%  Weight:      Height:        Intake/Output Summary (Last 24 hours) at 09/15/2023 0745 Last data filed at 09/15/2023 0400 Gross per 24 hour  Intake 874.35 ml  Output 850 ml  Net 24.35 ml   Weight change:  Exam:  General:  Pt is alert, follows commands appropriately, not in acute distress HEENT: No icterus, No thrush, No neck mass, [place]/AT Cardiovascular: RRR, S1/S2, no rubs, no gallops Respiratory: CTA bilaterally, no wheezing, no crackles, no rhonchi Abdomen: Soft/+BS, non tender, non distended, no guarding Extremities:  No edema, No lymphangitis, No petechiae, No rashes, no synovitis   Data Reviewed: I have personally reviewed following labs and imaging studies Basic Metabolic Panel: Recent Labs  Lab 09/14/23 1211 09/15/23 0442  NA 135 138  K 4.2 4.3  CL 103 107  CO2 22 19*  GLUCOSE 178* 173*  BUN 52* 49*  CREATININE  6.20* 6.09*  CALCIUM 8.0* 8.4*   Liver Function Tests: Recent Labs  Lab 09/14/23 1211  AST 18  ALT 17  ALKPHOS 67  BILITOT 0.5  PROT 6.6  ALBUMIN 3.2*   No results for input(s): "LIPASE", "AMYLASE" in the last 168 hours. No results for input(s): "AMMONIA" in the last 168 hours. Coagulation Profile: No results for input(s): "INR", "PROTIME" in the last 168 hours. CBC: Recent Labs  Lab 09/14/23 1211 09/15/23 0442  WBC 11.1* 10.7*  NEUTROABS 8.7*  --   HGB 9.8* 9.8*  HCT 30.5* 30.7*  MCV 93.8 94.2  PLT 289 268   Cardiac Enzymes: No results for input(s): "CKTOTAL", "CKMB", "CKMBINDEX", "TROPONINI" in the last 168 hours. BNP: Invalid input(s): "POCBNP" CBG: Recent Labs  Lab 09/14/23 2039  GLUCAP 155*   HbA1C: No results for input(s): "HGBA1C" in the last 72 hours. Urine analysis:    Component Value Date/Time   COLORURINE STRAW (A) 09/14/2023 1909   APPEARANCEUR CLEAR 09/14/2023 1909   LABSPEC 1.012 09/14/2023 1909   PHURINE 6.0 09/14/2023 1909   GLUCOSEU 150 (A) 09/14/2023 1909   HGBUR NEGATIVE 09/14/2023 1909   BILIRUBINUR NEGATIVE 09/14/2023 1909   KETONESUR NEGATIVE 09/14/2023 1909   PROTEINUR >=300 (A) 09/14/2023 1909   UROBILINOGEN 0.2 08/21/2011 1831   NITRITE NEGATIVE 09/14/2023 1909   LEUKOCYTESUR NEGATIVE 09/14/2023 1909   Sepsis Labs: @LABRCNTIP (procalcitonin:4,lacticidven:4) ) Recent Results (from the past 240 hours)  MRSA Next Gen by PCR, Nasal     Status: None   Collection Time: 09/14/23  9:47 PM   Specimen: Nasal Mucosa; Nasal Swab  Result Value Ref Range Status   MRSA by PCR Next Gen NOT DETECTED NOT DETECTED Final    Comment: (NOTE) The GeneXpert MRSA Assay (FDA approved for NASAL specimens only), is one component of a comprehensive MRSA colonization surveillance program. It is not intended to diagnose MRSA infection nor to guide or monitor treatment for MRSA infections. Test performance is not FDA approved in patients less than 76  years old. Performed at [facility], [address], [place], [place] [address]      Scheduled Meds:  carvedilol  6.25 mg Oral BID WC   Chlorhexidine Gluconate Cloth  6 each Topical Daily   heparin  5,000 Units Subcutaneous Q8H   hydrALAZINE  25 mg Oral Q8H   insulin aspart  0-5 Units Subcutaneous QHS   insulin aspart  0-9 Units Subcutaneous TID WC   rosuvastatin  10 mg Oral Daily   Continuous Infusions:  niCARDipine Stopped (09/15/23 0110)    Procedures/Studies: DG Chest 2 View Result Date: 09/14/2023 CLINICAL DATA:  Shortness of breath. EXAM: CHEST - 2 VIEW COMPARISON:  October 09, 2012. FINDINGS: The heart size and mediastinal contours are within normal limits. Bilateral interstitial prominence and peribronchial cuffing. No focal consolidation. No pleural effusion or pneumothorax. No acute osseous abnormality. IMPRESSION: Bilateral interstitial prominence and peribronchial cuffing most concerning for bronchitis. No focal consolidation. Electronically Signed   By: Hart Robinsons M.D.   On: 09/14/2023 14:09    Catarina Hartshorn, DO  [facility]  If 7PM-7AM, please contact night-coverage [email or web address] Password [facility] 09/15/2023,  7:45 AM   LOS: 1 day

## 2023-09-15 NOTE — Progress Notes (Signed)
*  PRELIMINARY RESULTS* Echocardiogram 2D Echocardiogram has been performed.  Stacey Drain 09/15/2023, 11:51 AM

## 2023-09-16 DIAGNOSIS — N184 Chronic kidney disease, stage 4 (severe): Secondary | ICD-10-CM | POA: Diagnosis not present

## 2023-09-16 DIAGNOSIS — E66812 Obesity, class 2: Secondary | ICD-10-CM | POA: Diagnosis not present

## 2023-09-16 DIAGNOSIS — I10 Essential (primary) hypertension: Secondary | ICD-10-CM | POA: Diagnosis not present

## 2023-09-16 LAB — BASIC METABOLIC PANEL
Anion gap: 9 (ref 5–15)
BUN: 51 mg/dL — ABNORMAL HIGH (ref 6–20)
CO2: 19 mmol/L — ABNORMAL LOW (ref 22–32)
Calcium: 8.3 mg/dL — ABNORMAL LOW (ref 8.9–10.3)
Chloride: 110 mmol/L (ref 98–111)
Creatinine, Ser: 6.17 mg/dL — ABNORMAL HIGH (ref 0.61–1.24)
GFR, Estimated: 10 mL/min — ABNORMAL LOW (ref >60)
Glucose, Bld: 144 mg/dL — ABNORMAL HIGH (ref 70–99)
Potassium: 4.3 mmol/L (ref 3.5–5.1)
Sodium: 138 mmol/L (ref 135–145)

## 2023-09-16 LAB — GLUCOSE, CAPILLARY
Glucose-Capillary: 142 mg/dL — ABNORMAL HIGH (ref 70–99)
Glucose-Capillary: 163 mg/dL — ABNORMAL HIGH (ref 70–99)

## 2023-09-16 LAB — TSH: TSH: 2.583 u[IU]/mL (ref 0.350–4.500)

## 2023-09-16 MED ORDER — CLONIDINE HCL 0.1 MG PO TABS
0.1000 mg | ORAL_TABLET | Freq: Three times a day (TID) | ORAL | Status: DC
  Administered 2023-09-16 (×2): 0.1 mg via ORAL
  Filled 2023-09-16 (×2): qty 1

## 2023-09-16 MED ORDER — ROSUVASTATIN CALCIUM 40 MG PO TABS: 40.0000 mg | ORAL_TABLET | Freq: Every day | ORAL | 2 refills | Status: DC

## 2023-09-16 MED ORDER — CARVEDILOL 12.5 MG PO TABS
25.0000 mg | ORAL_TABLET | Freq: Two times a day (BID) | ORAL | Status: DC
Start: 2023-09-16 — End: 2023-09-16
  Administered 2023-09-16: 25 mg via ORAL
  Filled 2023-09-16: qty 2

## 2023-09-16 MED ORDER — CLONIDINE HCL 0.1 MG PO TABS: 0.1000 mg | ORAL_TABLET | Freq: Three times a day (TID) | ORAL | 2 refills | Status: DC

## 2023-09-16 MED ORDER — HYDRALAZINE HCL 50 MG PO TABS
100.0000 mg | ORAL_TABLET | Freq: Three times a day (TID) | ORAL | Status: DC
Start: 2023-09-16 — End: 2023-09-16
  Administered 2023-09-16: 100 mg via ORAL
  Filled 2023-09-16: qty 2

## 2023-09-16 MED ORDER — CARVEDILOL 25 MG PO TABS: 25.0000 mg | ORAL_TABLET | Freq: Two times a day (BID) | ORAL | 2 refills | Status: DC

## 2023-09-16 MED ORDER — HYDRALAZINE HCL 100 MG PO TABS: 100.0000 mg | ORAL_TABLET | Freq: Three times a day (TID) | ORAL | 2 refills | Status: DC

## 2023-09-16 NOTE — Progress Notes (Signed)
Patients blood pressure at shift change was around 185 systolic. During night I was told blood pressure had climbed to over 200 systolic and patient was placed on Cardene at that time.

## 2023-09-16 NOTE — Discharge Summary (Signed)
Physician Discharge Summary   Patient: Luis Wood MRN: [id number] DOB: 1971/03/18  Admit date:     09/14/2023  Discharge date: 09/16/23  Discharge Physician: Onalee Hua Tondra Reierson   PCP: Jarold Motto, PA   Recommendations at discharge:   Please follow up with primary care provider within 1-2 weeks  Please repeat BMP in one week Follow up with [facility], Dr. Allena Katz in 1-2 weeks    Hospital Course: 52 year old male with a history of hypertension, hyperlipidemia, diabetes mellitus type 2, CKD, and asthma presenting with 1 to 2-day history of chest discomfort and shortness of breath.  The patient states that this would occur at rest as well as exertion.  He denied any coughing, hemoptysis, fevers, chills.  He denies any headache, nausea, vomiting or diarrhea, abdominal pain.  He states his appetite has been good. He denies any NSAIDs or over-the-counter medications. The patient had a previous history of regular alcohol use but stopped in 2021.  He used to smoke cigars regularly, but he stopped in 2021.  He denies any illicit drugs. He states that he has not taken his amlodipine for last 2 weeks.  Prior to that, he endorsed compliance.  He feels that the amlodipine was causing him anxiety and palpitations  In the ED, the patient was afebrile and hemodynamically stable.  Blood pressure was up to 247/120.  Oxygen saturation was 99% room air.  WBC 11.1, hemoglobin 9.8, platelets 289.  Sodium 135, potassium 4.2, bicarbonate 22, serum creatinine 6.20. The patient was started on a nicardipine drip initially.  He had an episode of near syncope.  This was discontinued and he was started on oral antihypertensive medications and followed closely.  Assessment and Plan: Hypertensive urgency/malignant hypertension -Initially on nicardipine drip -Transition to oral antihypertensive medications -Check UDS--neg -started coreg and hydralazine -urine protein creatinine ratio--3.19 -d/c home with coreg  25 mg bid, hydralazine 100 mg q 8 hours and clonidine 0.1 mg tid -SBP 160s-170 at time of d/c -follow up with PCP/renal for titration of BP meds   Acute on chronic renal failure--CKD4 -Patient has serum creatinine 4.37 on Feb 07, 2023 -Likely has progression of underlying CKD -Presented with serum creatinine 6.20 -Judicious IV fluids for next 12 hours - serum creatinine ranged 6.09-6.20 during hospitalization -Renal ultrasound--no hydronephrosis; normal echogenicity -case discussed with renal (Dr. Dellis Anes dc for outpt follow up if electrolytes and serum creatinine remains stable -follow up with his established nephrologist, Dr. Zetta Bills   Anemia of CKD -Check iron studies>>iron sat 21%, ferritin 125 -Baseline creatinine~9 -stable   Hyperlipidemia -Continue statin -Total cholesterol 300, LDL 230,TG 105 -crestor increased to 40 mg daily   Elevated troponin/chest discomfort -Secondary to elevated BP -Chest discomfort is resolved with controlling blood pressure -Troponin 62>> 65 -Echocardiogram   Diabetes mellitus type 2 with hyperglycemia -12/21 hemoglobin A1c 5.5 -NovoLog sliding scale -Holding Trulicity>>resume after d/c        Consultants: none Procedures performed: none  Disposition: Home Diet recommendation:  Low sodium DISCHARGE MEDICATION: Allergies as of 09/16/2023       Reactions   Aspirin Other (See Comments)   Dizziness and fainting, this was a childhood allergy        Medication List     STOP taking these medications    amLODipine 10 MG tablet Commonly known as: NORVASC       TAKE these medications    albuterol 108 (90 Base) MCG/ACT inhaler Commonly known as: VENTOLIN HFA Inhale 2 puffs into the lungs  every 4 (four) hours as needed. For wheezing.   carvedilol 25 MG tablet Commonly known as: COREG Take 1 tablet (25 mg total) by mouth 2 (two) times daily with a meal.   cloNIDine 0.1 MG tablet Commonly known as: CATAPRES Take 1  tablet (0.1 mg total) by mouth 3 (three) times daily.   FeroSul 325 (65 Fe) MG tablet Generic drug: ferrous sulfate Take 325 mg by mouth daily.   hydrALAZINE 100 MG tablet Commonly known as: APRESOLINE Take 1 tablet (100 mg total) by mouth every 8 (eight) hours.   Ozempic (0.25 or 0.5 MG/DOSE) 2 MG/3ML Sopn Generic drug: Semaglutide(0.25 or 0.5MG /DOS) Inject 0.25 mg into the skin once a week. What changed: when to take this   rosuvastatin 40 MG tablet Commonly known as: Crestor Take 1 tablet (40 mg total) by mouth daily. What changed:  medication strength how much to take   Vitamin D3 50 MCG (2000 UT) capsule Take 2,000 Units by mouth daily.        Follow-up Information     Dagoberto Ligas, MD Follow up in 1 week(s).   Specialties: Nephrology, Vascular Surgery Contact information: [address] [place] [address] [phone]                Discharge Exam: Filed Weights   09/14/23 1152 09/15/23 0420  Weight: 102.5 kg 101.7 kg   HEENT:  [place]/AT, No thrush, no icterus CV:  RRR, no rub, no S3, no S4 Lung:  CTA, no wheeze, no rhonchi Abd:  soft/+BS, NT Ext:  No edema, no lymphangitis, no synovitis, no rash   Condition at discharge: stable  The results of significant diagnostics from this hospitalization (including imaging, microbiology, ancillary and laboratory) are listed below for reference.   Imaging Studies: ECHOCARDIOGRAM COMPLETE Result Date: 09/15/2023    ECHOCARDIOGRAM REPORT   Patient Name:   Luis Wood Date of Exam: 09/15/2023 Medical Rec #:  [id number]         Height:       67.0 in Accession #:    [id number]        Weight:       224.2 lb Date of Birth:  1971/09/03        BSA:          2.123 m Patient Age:    52 years          BP:           179/102 mmHg Patient Gender: M                 HR:           98 bpm. [facility] Procedure: 2D Echo, Cardiac Doppler and Color Doppler Indications:    Dyspnea R06.00  History:        Patient  has prior history of Echocardiogram examinations, most                 recent 08/01/2022. Risk Factors:Dyslipidemia, Diabetes and                 Hypertension. CKD (chronic kidney disease) stage 4, GFR 15-29                 ml/min (HCC).  Sonographer:    Celesta Gentile RCS Referring Phys: [phone] DAWOOD S [place] IMPRESSIONS  1. Left ventricular ejection fraction, by estimation, is 65 to 70%. The left ventricle has normal function. The left ventricle has no regional wall  motion abnormalities. There is severe concentric left ventricular hypertrophy. Left ventricular diastolic  parameters are consistent with Grade I diastolic dysfunction (impaired relaxation).  2. Right ventricular systolic function is hyperdynamic. The right ventricular size is normal.  3. The mitral valve is abnormal. No evidence of mitral valve regurgitation. No evidence of mitral stenosis.  4. The aortic valve was not well visualized. There is mild calcification of the aortic valve. Aortic valve regurgitation is not visualized. Aortic valve sclerosis is present, with no evidence of aortic valve stenosis.  5. The inferior vena cava is normal in size with greater than 50% respiratory variability, suggesting right atrial pressure of 3 mmHg. FINDINGS  Left Ventricle: Papillary muscle hypertrophy- severe hypertrophy may be due to long standing hypertension. Left ventricular ejection fraction, by estimation, is 65 to 70%. The left ventricle has normal function. The left ventricle has no regional wall motion abnormalities. The left ventricular internal cavity size was normal in size. There is severe concentric left ventricular hypertrophy. Left ventricular diastolic parameters are consistent with Grade I diastolic dysfunction (impaired relaxation). Right Ventricle: The right ventricular size is normal. No increase in right ventricular wall thickness. Right ventricular systolic function is hyperdynamic. Left Atrium: Left atrial size was normal in size. Right  Atrium: Right atrial size was normal in size. Pericardium: There is no evidence of pericardial effusion. Mitral Valve: The mitral valve is abnormal. No evidence of mitral valve regurgitation. No evidence of mitral valve stenosis. The mean mitral valve gradient is 2.4 mmHg with average heart rate of 94 bpm. Tricuspid Valve: The tricuspid valve is not well visualized. Tricuspid valve regurgitation is trivial. No evidence of tricuspid stenosis. Aortic Valve: The aortic valve was not well visualized. There is mild calcification of the aortic valve. There is mild aortic valve annular calcification. Aortic valve regurgitation is not visualized. Aortic valve sclerosis is present, with no evidence of aortic valve stenosis. Pulmonic Valve: The pulmonic valve was not well visualized. Pulmonic valve regurgitation is not visualized. No evidence of pulmonic stenosis. Aorta: The aortic root, ascending aorta and aortic arch are all structurally normal, with no evidence of dilitation or obstruction. Venous: The inferior vena cava is normal in size with greater than 50% respiratory variability, suggesting right atrial pressure of 3 mmHg. IAS/Shunts: The atrial septum is grossly normal.  LEFT VENTRICLE PLAX 2D LVIDd:         4.40 cm   Diastology LVIDs:         3.10 cm   LV e' medial:    5.98 cm/s LV PW:         1.70 cm   LV E/e' medial:  16.9 LV IVS:        1.60 cm   LV e' lateral:   8.05 cm/s LVOT diam:     1.80 cm   LV E/e' lateral: 12.5 LV SV:         52 LV SV Index:   25 LVOT Area:     2.54 cm  RIGHT VENTRICLE RV S prime:     19.90 cm/s TAPSE (M-mode): 2.6 cm LEFT ATRIUM             Index        RIGHT ATRIUM           Index LA diam:        5.00 cm 2.36 cm/m   RA Area:     15.70 cm LA Vol (A2C):   63.3 ml 29.82 ml/m  RA Volume:  40.60 ml  19.13 ml/m LA Vol (A4C):   68.1 ml 32.08 ml/m LA Biplane Vol: 67.6 ml 31.84 ml/m  AORTIC VALVE LVOT Vmax:   107.00 cm/s LVOT Vmean:  77.000 cm/s LVOT VTI:    0.205 m  AORTA Ao Root diam:  3.60 cm MITRAL VALVE MV Area (PHT): 3.91 cm     SHUNTS MV Mean grad:  2.4 mmHg     Systemic VTI:  0.20 m MV Decel Time: 194 msec     Systemic Diam: 1.80 cm MV E velocity: 101.00 cm/s MV A velocity: 127.00 cm/s MV E/A ratio:  0.80 Riley Lam MD Electronically signed by Riley Lam MD Signature Date/Time: 09/15/2023/2:51:03 PM    Final    US RENAL Result Date: 09/15/2023 CLINICAL DATA:  Acute kidney injury EXAM: RENAL / URINARY TRACT ULTRASOUND COMPLETE COMPARISON:  None available FINDINGS: Right Kidney: Renal measurements: 11.8 x 6.2 x 6.3 cm = volume: 241 mL. Echogenicity within normal limits. No mass or hydronephrosis visualized. Left Kidney: Renal measurements: 10.2 x 5.9 x 5.5 cm = volume: 174 mL. Echogenicity within normal limits. No mass or hydronephrosis visualized. Bladder: Appears normal for degree of bladder distention. Other: None. IMPRESSION: No significant sonographic abnormality of the kidneys. Electronically Signed   By: Acquanetta Belling M.D.   On: 09/15/2023 10:51   DG Chest 2 View Result Date: 09/14/2023 CLINICAL DATA:  Shortness of breath. EXAM: CHEST - 2 VIEW COMPARISON:  October 09, 2012. FINDINGS: The heart size and mediastinal contours are within normal limits. Bilateral interstitial prominence and peribronchial cuffing. No focal consolidation. No pleural effusion or pneumothorax. No acute osseous abnormality. IMPRESSION: Bilateral interstitial prominence and peribronchial cuffing most concerning for bronchitis. No focal consolidation. Electronically Signed   By: Hart Robinsons M.D.   On: 09/14/2023 14:09    Microbiology: Results for orders placed or performed during the hospital encounter of 09/14/23  MRSA Next Gen by PCR, Nasal     Status: None   Collection Time: 09/14/23  9:47 PM   Specimen: Nasal Mucosa; Nasal Swab  Result Value Ref Range Status   MRSA by PCR Next Gen NOT DETECTED NOT DETECTED Final    Comment: (NOTE) The GeneXpert MRSA Assay (FDA  approved for NASAL specimens only), is one component of a comprehensive MRSA colonization surveillance program. It is not intended to diagnose MRSA infection nor to guide or monitor treatment for MRSA infections. Test performance is not FDA approved in patients less than 85 years old. Performed at [facility], [address], [place], [place] [address]     Labs: CBC: Recent Labs  Lab 09/14/23 1211 09/15/23 0442  WBC 11.1* 10.7*  NEUTROABS 8.7*  --   HGB 9.8* 9.8*  HCT 30.5* 30.7*  MCV 93.8 94.2  PLT 289 268   Basic Metabolic Panel: Recent Labs  Lab 09/14/23 1211 09/15/23 0442 09/16/23 0428  NA 135 138 138  K 4.2 4.3 4.3  CL 103 107 110  CO2 22 19* 19*  GLUCOSE 178* 173* 144*  BUN 52* 49* 51*  CREATININE 6.20* 6.09* 6.17*  CALCIUM 8.0* 8.4* 8.3*   Liver Function Tests: Recent Labs  Lab 09/14/23 1211  AST 18  ALT 17  ALKPHOS 67  BILITOT 0.5  PROT 6.6  ALBUMIN 3.2*   CBG: Recent Labs  Lab 09/15/23 1144 09/15/23 1542 09/15/23 2052 09/16/23 0740 09/16/23 1137  GLUCAP 157* 128* 153* 142* 163*    Discharge time spent: greater than 30 minutes.  Signed: Catarina Hartshorn, MD  [facility] 09/16/2023

## 2023-09-16 NOTE — Progress Notes (Signed)
Patient being discharged to home. IV access removed and patient dressed himself. Requested to walk himself out, we provided an attendant to escort him through ER where his car was parked.

## 2023-09-17 ENCOUNTER — Telehealth: Payer: Self-pay

## 2023-09-17 NOTE — Transitions of Care (Post Inpatient/ED Visit) (Signed)
   09/17/2023  Name: Luis Wood MRN: [id number] DOB: Aug 20, 1971  Today's TOC FU Call Status: Today's TOC FU Call Status:: Unsuccessful Call (1st Attempt) Unsuccessful Call (1st Attempt) Date: 09/17/23  Attempted to reach the patient regarding the most recent Inpatient/ED visit.  Follow Up Plan: Additional outreach attempts will be made to reach the patient to complete the Transitions of Care (Post Inpatient/ED visit) call.     Antionette Fairy, RN,BSN,CCM RN Care Manager Transitions of Care  [place]  Direct Phone: [phone] Toll Free: [phone] Fax: [phone]

## 2023-09-18 ENCOUNTER — Telehealth: Payer: Self-pay

## 2023-09-18 NOTE — Transitions of Care (Post Inpatient/ED Visit) (Signed)
   09/18/2023  Name: Luis Wood MRN: [id number] DOB: [birth year]  Today's TOC FU Call Status: Today's TOC FU Call Status:: Unsuccessful Call (2nd Attempt) Unsuccessful Call (2nd Attempt) Date: 09/18/23  Attempted to reach the patient regarding the most recent Inpatient/ED visit.  Follow Up Plan: Additional outreach attempts will be made to reach the patient to complete the Transitions of Care (Post Inpatient/ED visit) call.     Antionette Fairy, RN,BSN,CCM RN Care Manager Transitions of Care  [place]  Direct Phone: [phone] Toll Free: [phone] Fax: [phone]

## 2023-09-18 NOTE — Transitions of Care (Post Inpatient/ED Visit) (Signed)
   09/18/2023  Name: Luis Wood MRN: [id number] DOB: 1971-05-16  Today's TOC FU Call Status: Today's TOC FU Call Status:: Unsuccessful Call (3rd Attempt) Unsuccessful Call (3rd Attempt) Date: 09/18/23  Attempted to reach the patient regarding the most recent Inpatient/ED visit.  Follow Up Plan: No further outreach attempts will be made at this time. We have been unable to contact the patient.     Antionette Fairy, RN,BSN,CCM RN Care Manager Transitions of Care  [place]  Direct Phone: [phone] Toll Free: [phone] Fax: [phone]

## 2023-10-05 ENCOUNTER — Encounter: Payer: Self-pay | Admitting: Family Medicine

## 2023-10-05 ENCOUNTER — Encounter (HOSPITAL_BASED_OUTPATIENT_CLINIC_OR_DEPARTMENT_OTHER): Payer: Self-pay | Admitting: Emergency Medicine

## 2023-10-05 ENCOUNTER — Ambulatory Visit: Payer: 59 | Admitting: Family Medicine

## 2023-10-05 ENCOUNTER — Other Ambulatory Visit: Payer: Self-pay

## 2023-10-05 ENCOUNTER — Emergency Department (HOSPITAL_BASED_OUTPATIENT_CLINIC_OR_DEPARTMENT_OTHER)
Admission: EM | Admit: 2023-10-05 | Discharge: 2023-10-05 | Disposition: A | Discharge status: Home / Self Care | Payer: 59 | Attending: Emergency Medicine | Admitting: Emergency Medicine

## 2023-10-05 VITALS — BP 222/112 | HR 95 | Temp 98.8°F | Resp 18 | Ht 67.0 in | Wt 228.2 lb

## 2023-10-05 DIAGNOSIS — Z79899 Other long term (current) drug therapy: Secondary | ICD-10-CM | POA: Diagnosis not present

## 2023-10-05 DIAGNOSIS — N184 Chronic kidney disease, stage 4 (severe): Secondary | ICD-10-CM

## 2023-10-05 DIAGNOSIS — E1122 Type 2 diabetes mellitus with diabetic chronic kidney disease: Secondary | ICD-10-CM | POA: Insufficient documentation

## 2023-10-05 DIAGNOSIS — I129 Hypertensive chronic kidney disease with stage 1 through stage 4 chronic kidney disease, or unspecified chronic kidney disease: Secondary | ICD-10-CM | POA: Insufficient documentation

## 2023-10-05 DIAGNOSIS — N189 Chronic kidney disease, unspecified: Secondary | ICD-10-CM | POA: Insufficient documentation

## 2023-10-05 DIAGNOSIS — E785 Hyperlipidemia, unspecified: Secondary | ICD-10-CM | POA: Diagnosis not present

## 2023-10-05 DIAGNOSIS — E1169 Type 2 diabetes mellitus with other specified complication: Secondary | ICD-10-CM | POA: Diagnosis not present

## 2023-10-05 DIAGNOSIS — I10 Essential (primary) hypertension: Secondary | ICD-10-CM

## 2023-10-05 LAB — CBC WITH DIFFERENTIAL/PLATELET
Abs Immature Granulocytes: 0.03 10*3/uL (ref 0.00–0.07)
Basophils Absolute: 0.1 10*3/uL (ref 0.0–0.1)
Basophils Relative: 1 %
Eosinophils Absolute: 0.1 10*3/uL (ref 0.0–0.5)
Eosinophils Relative: 1 %
HCT: 29.3 % — ABNORMAL LOW (ref 39.0–52.0)
Hemoglobin: 9.5 g/dL — ABNORMAL LOW (ref 13.0–17.0)
Immature Granulocytes: 0 %
Lymphocytes Relative: 17 %
Lymphs Abs: 1.6 10*3/uL (ref 0.7–4.0)
MCH: 29.4 pg (ref 26.0–34.0)
MCHC: 32.4 g/dL (ref 30.0–36.0)
MCV: 90.7 fL (ref 80.0–100.0)
Monocytes Absolute: 0.8 10*3/uL (ref 0.1–1.0)
Monocytes Relative: 9 %
Neutro Abs: 6.7 10*3/uL (ref 1.7–7.7)
Neutrophils Relative %: 72 %
Platelets: 252 10*3/uL (ref 150–400)
RBC: 3.23 MIL/uL — ABNORMAL LOW (ref 4.22–5.81)
RDW: 12.4 % (ref 11.5–15.5)
WBC: 9.3 10*3/uL (ref 4.0–10.5)
nRBC: 0 % (ref 0.0–0.2)

## 2023-10-05 LAB — COMPREHENSIVE METABOLIC PANEL
ALT: 10 U/L (ref 0–44)
AST: 12 U/L — ABNORMAL LOW (ref 15–41)
Albumin: 3.8 g/dL (ref 3.5–5.0)
Alkaline Phosphatase: 73 U/L (ref 38–126)
Anion gap: 8 (ref 5–15)
BUN: 45 mg/dL — ABNORMAL HIGH (ref 6–20)
CO2: 24 mmol/L (ref 22–32)
Calcium: 8.7 mg/dL — ABNORMAL LOW (ref 8.9–10.3)
Chloride: 105 mmol/L (ref 98–111)
Creatinine, Ser: 6.44 mg/dL — ABNORMAL HIGH (ref 0.61–1.24)
GFR, Estimated: 10 mL/min — ABNORMAL LOW (ref >60)
Glucose, Bld: 130 mg/dL — ABNORMAL HIGH (ref 70–99)
Potassium: 4.7 mmol/L (ref 3.5–5.1)
Sodium: 137 mmol/L (ref 135–145)
Total Bilirubin: 0.4 mg/dL (ref 0.0–1.2)
Total Protein: 6.9 g/dL (ref 6.5–8.1)

## 2023-10-05 MED ORDER — CARVEDILOL 12.5 MG PO TABS: 25.0000 mg | ORAL_TABLET | Freq: Once | ORAL | Status: DC

## 2023-10-05 MED ORDER — FLUTICASONE PROPIONATE 50 MCG/ACT NA SUSP
2.0000 | Freq: Once | NASAL | Status: AC
  Administered 2023-10-05: 2 via NASAL
  Filled 2023-10-05: qty 16

## 2023-10-05 MED ORDER — HYDRALAZINE HCL 25 MG PO TABS
100.0000 mg | ORAL_TABLET | Freq: Once | ORAL | Status: AC
  Administered 2023-10-05: 100 mg via ORAL
  Filled 2023-10-05: qty 4

## 2023-10-05 MED ORDER — LABETALOL HCL 5 MG/ML IV SOLN
5.0000 mg | Freq: Once | INTRAVENOUS | Status: AC
  Administered 2023-10-05: 5 mg via INTRAVENOUS
  Filled 2023-10-05: qty 4

## 2023-10-05 MED ORDER — CLONIDINE HCL 0.1 MG PO TABS
0.1000 mg | ORAL_TABLET | Freq: Once | ORAL | Status: AC
  Administered 2023-10-05: 0.1 mg via ORAL
  Filled 2023-10-05: qty 1

## 2023-10-05 NOTE — ED Triage Notes (Signed)
 Pt went to pcp, was wanting meds "for a cold". Referred for hypertension. Pt denies HA or CP. Did not take meds today, reports he is running errands today and it "makes me feel tired"

## 2023-10-05 NOTE — ED Provider Notes (Signed)
 [place] EMERGENCY DEPARTMENT AT [facility] Provider Note   CSN: [id number] Arrival date & time: 10/05/23  1345     History  Chief Complaint  Patient presents with   Hypertension    Luis Wood is a 53 y.o. male with history of type 2 diabetes, hypertension, CKD, presents with concern for elevated blood pressure reading this morning.  States he did not take his blood pressure medications this morning because he was going to do errands and did not want to feel drowsy.  He was at his PCPs office and told to come to the ER for blood pressure management since his blood pressure was high.  He feels at his baseline. Denies any headache, chest pain, shortness of breath, abdominal pain, nausea or vomiting. States his normal blood pressures at home are in the systolic of 160's and 170's.    Hypertension Pertinent negatives include no chest pain and no shortness of breath.       Home Medications Prior to Admission medications   Medication Sig Start Date End Date Taking? Authorizing Provider  albuterol  (PROVENTIL  HFA;VENTOLIN  HFA) 108 (90 BASE) MCG/ACT inhaler Inhale 2 puffs into the lungs every 4 (four) hours as needed. For wheezing.    [provider]  carvedilol  (COREG ) 25 MG tablet Take 1 tablet (25 mg total) by mouth 2 (two) times daily with a meal. 09/16/23   Tat, Alm, MD  Cholecalciferol (VITAMIN D3) 50 MCG (2000 UT) capsule Take 2,000 Units by mouth daily. 06/19/22   [provider]  cloNIDine  (CATAPRES ) 0.1 MG tablet Take 1 tablet (0.1 mg total) by mouth 3 (three) times daily. 09/16/23   Evonnie Alm, MD  FEROSUL 325 (65 Fe) MG tablet Take 325 mg by mouth daily. 08/21/22   [provider]  hydrALAZINE  (APRESOLINE ) 100 MG tablet Take 1 tablet (100 mg total) by mouth every 8 (eight) hours. 09/16/23   Evonnie Alm, MD  rosuvastatin  (CRESTOR ) 40 MG tablet Take 1 tablet (40 mg total) by mouth daily. 09/16/23   Evonnie Alm, MD  Semaglutide ,0.25 or  0.5MG /DOS, (OZEMPIC , 0.25 OR 0.5 MG/DOSE,) 2 MG/3ML SOPN Inject 0.25 mg into the skin once a week. Patient taking differently: Inject 0.25 mg into the skin every Monday. 05/30/23   [name], PA      Allergies    Aspirin    Review of Systems   Review of Systems  Respiratory:  Negative for shortness of breath.   Cardiovascular:  Negative for chest pain.    Physical Exam Updated Vital Signs BP (!) 175/87 (BP Location: Right Arm)   Pulse 87   Temp 98.1 F (36.7 C) (Oral)   Resp 18   Wt 103.4 kg   SpO2 99%   BMI 35.71 kg/m  Physical Exam Vitals and nursing note reviewed.  Constitutional:      General: He is not in acute distress.    Appearance: Normal appearance. He is well-developed.  HENT:     Head: Normocephalic and atraumatic.  Eyes:     Conjunctiva/sclera: Conjunctivae normal.  Cardiovascular:     Rate and Rhythm: Normal rate and regular rhythm.     Heart sounds: No murmur heard. Pulmonary:     Effort: Pulmonary effort is normal. No respiratory distress.     Breath sounds: Normal breath sounds.  Abdominal:     Palpations: Abdomen is soft.     Tenderness: There is no abdominal tenderness.  Musculoskeletal:        General: No swelling.  Cervical back: Neck supple.  Skin:    General: Skin is warm and dry.     Capillary Refill: Capillary refill takes less than 2 seconds.  Neurological:     General: No focal deficit present.     Mental Status: He is alert.  Psychiatric:        Mood and Affect: Mood normal.        Behavior: Behavior normal.     ED Results / Procedures / Treatments   Labs (all labs ordered are listed, but only abnormal results are displayed) Labs Reviewed  CBC WITH DIFFERENTIAL/PLATELET - Abnormal; Notable for the following components:      Result Value   RBC 3.23 (*)    Hemoglobin 9.5 (*)    HCT 29.3 (*)    All other components within normal limits  COMPREHENSIVE METABOLIC PANEL - Abnormal; Notable for the following components:    Glucose, Bld 130 (*)    BUN 45 (*)    Creatinine, Ser 6.44 (*)    Calcium  8.7 (*)    AST 12 (*)    GFR, Estimated 10 (*)    All other components within normal limits    EKG EKG Interpretation Date/Time:  Friday October 05 2023 14:09:50 EST Ventricular Rate:  95 PR Interval:  158 QRS Duration:  86 QT Interval:  350 QTC Calculation: 440 R Axis:   5  Text Interpretation: Sinus rhythm LVH with secondary repolarization abnormality Anterior ST elevation, probably due to LVH Baseline wander in lead(s) V2 V3 No significant change since last tracing Confirmed by Dean Clarity [phone] on 10/05/2023 2:10:59 PM  Radiology No results found.  Procedures Procedures    Medications Ordered in ED Medications  cloNIDine  (CATAPRES ) tablet 0.1 mg (0.1 mg Oral Given 10/05/23 1503)  hydrALAZINE  (APRESOLINE ) tablet 100 mg (100 mg Oral Given 10/05/23 1510)  labetalol  (NORMODYNE ) injection 5 mg (5 mg Intravenous Given 10/05/23 1504)  labetalol  (NORMODYNE ) injection 5 mg (5 mg Intravenous Given 10/05/23 1600)  fluticasone  (FLONASE ) 50 MCG/ACT nasal spray 2 spray (2 sprays Each Nare Given 10/05/23 1603)    ED Course/ Medical Decision Making/ A&P                                 Medical Decision Making Amount and/or Complexity of Data Reviewed Labs: ordered.  Risk Prescription drug management.     Differential diagnosis includes but is not limited to hypertension, hypertensive emergency, medication noncompliance  ED Course:  Patient well-appearing, no acute distress.  Elevated blood pressure upon arrival at 217/111.  He has not taken his blood pressure medications yet today.  Asymptomatic. No concern for hypertensive emergency at this time. EKG with ST elevation in V1, but at baseline compared to prior EKGs. No concern for ACS at this time. Will give home clonidine , hydralazine , and IV labetalol  for BP management here. Will aim to lower 15% for goal systolic of 184.  His blood pressures normally  at home are reported to be in the systolic of 160s and 170s, this is in line with hospital discharge note from 09/16/2023. Upon re-evaluation, about 1 hour after getting medications, blood pressure remains at 199/97.  Not quite at goal.  Will give additional 5 mg labetalol  IV.  Patient also reports concern for some nasal congestion.  Will give Flonase .  His CBC and CMP are at baseline. Upon recheck after additional 5mg  Labetalol , blood pressure now at goal at 175/87.  Patient with no complaints, stable and appropriate for discharge at this time  Impression: Hypertension  Disposition:  The patient was discharged home with instructions to follow-up with PCP within the next month for further blood pressure control.  Follow-up with nephrology within the next week for further management of CKD.  Return precautions given.   Cardiac Monitoring: / EKG: The patient was maintained on a cardiac monitor.  I personally viewed and interpreted the cardiac monitored which showed an underlying rhythm of: Normal sinus rhythm   External records from outside source obtained and reviewed including hospital discharge note from 09/16/2023 where systolic blood pressures were in the 160s to 170 at time of discharge.  He was sent home on Coreg , hydralazine , clonidine  Was also recommended to follow-up with nephrology [place] [facility] Dr. Tobie              Final Clinical Impression(s) / ED Diagnoses Final diagnoses:  Hypertension, unspecified type    Rx / DC Orders ED Discharge Orders     None         Veta Palma, PA-C 10/05/23 1704    Dean Clarity, MD 10/08/23 [phone]

## 2023-10-05 NOTE — Progress Notes (Signed)
 Subjective:     Patient ID: Luis Wood, male    DOB: July 17, 1971, 53 y.o.   MRN: [id number]  Chief Complaint  Patient presents with   Asthma    Getting worse, having more asthma attacks, more flare ups everyday, using inhaler   Cough    Sx started over 5 days ago, want medication that will not affect blood pressure   Nasal Congestion    HPI Hosp 09/14/23 thru 09/16/23 for chest discomfort and sob.  Had stopped amlodipine  for 2 wks believing it was causing anxiety and palpitations.  ER bp was 247/120. Cr 6.2.  was placed on nicardipine  drip then had syncope.  Renal u/s done-nothing acute.  Renal rec outpt f/u Dr. Gordy Blanch.  Didn't take bp meds today as making very drowsy.  Has been taking meds, just none since about 7pm last night (it is now 1pm). Bp's have been 180's/70's  Last saw neph 1 yr ago.     2 days ago, woke up w/tightness in chest and uncomfortable in chest.  Used inhaler-minimal help.  Some wheezing intermitt and hard to take deep breath at times.  Very frustrated.  Crestor  was inc to 40mg  at TC 300 and LDL 230  A1C 5.5 HTN-coreg  25bid, clonidine  0.1mg  tid, hydralazine  100mg  tid Ozempic  0.25mg  weekly  DM.   Has URI-no meds for awhile  sick for 3-4 days-sneezing, watery eyes, mild cough.  Stuffy nose at night wonders if from bp meds.    Works at [employer] Due  Topic Date Due   INFLUENZA VACCINE  Never done   OPHTHALMOLOGY EXAM  Never done   Colonoscopy  Never done   FOOT EXAM  05/23/2023    Past Medical History:  Diagnosis Date   Asthma    Diabetes mellitus without complication (HCC)    Hypertension     History reviewed. No pertinent surgical history.   Current Outpatient Medications:    albuterol  (PROVENTIL  HFA;VENTOLIN  HFA) 108 (90 BASE) MCG/ACT inhaler, Inhale 2 puffs into the lungs every 4 (four) hours as needed. For wheezing., Disp: , Rfl:    carvedilol  (COREG ) 25 MG tablet, Take 1 tablet (25 mg total) by  mouth 2 (two) times daily with a meal., Disp: 60 tablet, Rfl: 2   Cholecalciferol (VITAMIN D3) 50 MCG (2000 UT) capsule, Take 2,000 Units by mouth daily., Disp: , Rfl:    cloNIDine  (CATAPRES ) 0.1 MG tablet, Take 1 tablet (0.1 mg total) by mouth 3 (three) times daily., Disp: 90 tablet, Rfl: 2   FEROSUL 325 (65 Fe) MG tablet, Take 325 mg by mouth daily., Disp: , Rfl:    hydrALAZINE  (APRESOLINE ) 100 MG tablet, Take 1 tablet (100 mg total) by mouth every 8 (eight) hours., Disp: 90 tablet, Rfl: 2   rosuvastatin  (CRESTOR ) 40 MG tablet, Take 1 tablet (40 mg total) by mouth daily., Disp: 30 tablet, Rfl: 2   Semaglutide ,0.25 or 0.5MG /DOS, (OZEMPIC , 0.25 OR 0.5 MG/DOSE,) 2 MG/3ML SOPN, Inject 0.25 mg into the skin once a week. (Patient taking differently: Inject 0.25 mg into the skin every Monday.), Disp: 3 mL, Rfl: 2  Allergies  Allergen Reactions   Aspirin Other (See Comments)    Dizziness and fainting, this was a childhood allergy   ROS neg/noncontributory except as noted HPI/below      Objective:     BP (!) 222/112 (BP Location: Right Arm, Patient Position: Sitting, Cuff Size: Large)   Pulse 95   Temp 98.8  F (37.1 C) (Temporal)   Resp 18   Ht 5' 7 (1.702 m)   Wt 228 lb 4 oz (103.5 kg)   SpO2 99%   BMI 35.75 kg/m  Wt Readings from Last 3 Encounters:  10/05/23 228 lb 4 oz (103.5 kg)  09/15/23 224 lb 3.3 oz (101.7 kg)  03/01/23 231 lb (104.8 kg)    Physical Exam   Gen: WDWN NAD HEENT: NCAT, conjunctiva not injected, sclera nonicteric CARDIAC: tachy RRR, S1S2+, no murmur. MSK: no gross abnormalities.  NEURO: A&O x3.  CN II-XII intact.  PSYCH: normal mood. Good eye contact Reviewed hosp records      Assessment & Plan:  Malignant hypertension  Hyperlipidemia associated with type 2 diabetes mellitus (HCC)  Type 2 diabetes mellitus with stage 4 chronic kidney disease, without long-term current use of insulin  (HCC)  1  malignant HTN-bp's very elevated today.  Hasn't taken meds  yet, but not comfortable monitoring at home.  To ER  meds making very drowsy as well. 2  dm 2 w/ckd 4-5.   Needs to f/u neph.  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    Pt will need intensive mgmt-card, neph, lipid clinic.   Suspect some anxiety as well.  Will need freq visits. But for now, sent to ER d/t bp's  No follow-ups on file.  Jenkins CHRISTELLA Carrel, MD

## 2023-10-05 NOTE — Discharge Instructions (Addendum)
 Please continue to take your blood pressure medications at home as prescribed.  If they are causing you to feel drowsy and this is preventing you from taking them, please discuss this with your primary care provider to see if you would benefit from switching to different medications.  Is very important that you take your blood pressure medications daily.  Please schedule an appointment with the nephrologist as soon as possible to follow-up on your kidney function.  I have attached their contact information below.  Your blood counts were all at your baseline today.  Your kidney function was where it was at your past couple of blood draws.  Return the ER for any chest pain, shortness of breath, dizziness, severe headache, any other new or concerning symptoms.

## 2023-10-08 ENCOUNTER — Telehealth: Payer: Self-pay

## 2023-10-08 NOTE — Transitions of Care (Post Inpatient/ED Visit) (Signed)
   10/08/2023  Name: Luis Wood MRN: [id number] DOB: August 13, 1971  Today's TOC FU Call Status: Today's TOC FU Call Status:: Unsuccessful Call (1st Attempt) Unsuccessful Call (1st Attempt) Date: 10/08/23  Attempted to reach the patient regarding the most recent Inpatient/ED visit.  Follow Up Plan: Additional outreach attempts will be made to reach the patient to complete the Transitions of Care (Post Inpatient/ED visit) call.   Signature Julian Lemmings, LPN [facility] Nurse Health Advisor Direct Dial [phone]

## 2023-10-09 NOTE — Transitions of Care (Post Inpatient/ED Visit) (Signed)
   10/09/2023  Name: Luis Wood MRN: [id number] DOB: [birth year]  Today's TOC FU Call Status: Today's TOC FU Call Status:: Unsuccessful Call (2nd Attempt) Unsuccessful Call (1st Attempt) Date: 10/08/23 Unsuccessful Call (2nd Attempt) Date: 10/09/23  Attempted to reach the patient regarding the most recent Inpatient/ED visit.  Follow Up Plan: Additional outreach attempts will be made to reach the patient to complete the Transitions of Care (Post Inpatient/ED visit) call.   Signature  Julian Lemmings, LPN [facility] Nurse Health Advisor Direct Dial [phone]

## 2023-10-10 NOTE — Transitions of Care (Post Inpatient/ED Visit) (Signed)
   10/10/2023  Name: Luis Wood MRN: [id number] DOB: March 19, 1971  Today's TOC FU Call Status: Today's TOC FU Call Status:: Unsuccessful Call (3rd Attempt) Unsuccessful Call (1st Attempt) Date: 10/08/23 Unsuccessful Call (2nd Attempt) Date: 10/09/23 Unsuccessful Call (3rd Attempt) Date: 10/10/23  Attempted to reach the patient regarding the most recent Inpatient/ED visit.  Follow Up Plan: No further outreach attempts will be made at this time. We have been unable to contact the patient.  Signature Darrall Ellison, LPN [facility] Nurse Health Advisor Direct Dial [phone]

## 2023-11-03 ENCOUNTER — Other Ambulatory Visit: Payer: Self-pay | Admitting: Physician Assistant

## 2023-11-06 ENCOUNTER — Other Ambulatory Visit: Payer: Self-pay | Admitting: *Deleted

## 2023-11-06 MED ORDER — OZEMPIC (0.25 OR 0.5 MG/DOSE) 2 MG/3ML ~~LOC~~ SOPN: 0.2500 mg | PEN_INJECTOR | SUBCUTANEOUS | 2 refills | Status: DC

## 2023-11-22 ENCOUNTER — Telehealth: Payer: Self-pay | Admitting: *Deleted

## 2023-11-22 NOTE — Telephone Encounter (Unsigned)
 Copied from CRM [id number]. Topic: Clinical - Prescription Issue >> Nov 22, 2023 11:46 AM Denese Killings wrote: Reason for CRM: Patient wants prescriptions hydrALAZINE (APRESOLINE) 100 MG tablet, rosuvastatin (CRESTOR) 40 MG tablet, cloNIDine (CATAPRES) 0.1 MG tablet, Semaglutide,0.25 or 0.5MG /DOS, (OZEMPIC, 0.25 OR 0.5 MG/DOSE,) 2 MG/3ML SOPN, albuterol (PROVENTIL HFA;VENTOLIN HFA) 108 (90 BASE) MCG/ACT inhaler changed from [facility] to [facility] on [address] He states that pharmacy is closer to him.   Phone number [phone]

## 2023-11-23 NOTE — Telephone Encounter (Signed)
 Please call patient and schedule ED follow up for next week with [facility]. Tell him I can not send his medications until he comes into see Lelon Mast due to ordered by another prescriber.

## 2023-11-23 NOTE — Telephone Encounter (Signed)
 Pt is not needing refills at this time. He is just wanting to transfer them to another pharmacy. Did schedule on 12/10/23 to discuss Luis Wood taking some of them over.

## 2023-12-10 ENCOUNTER — Ambulatory Visit: Payer: 59 | Admitting: Physician Assistant

## 2024-01-02 NOTE — Progress Notes (Signed)
 Luis Wood is a 53 y.o. male here for a {New prob or follow up:31724}.  History of Present Illness:   No chief complaint on file.   HPI   Hypertension: Pt is on ***carvedilol 25mg  BID and hydralazine 100mg  every 8 hours Good compliance and tolerance ***   ***  Hyperlipidemia: Pt is on ***crestor 40mg  once daily Good compliance and tolerance ***   ***  Type II Diabetes: Pt is on ***ozempic 0.25mg  injected once weekly Good compliance and tolerance ***   ***       Past Medical History:  Diagnosis Date   Asthma    Diabetes mellitus without complication (HCC)    Hypertension      Social History   Tobacco Use   Smoking status: Never   Smokeless tobacco: Never  Substance Use Topics   Alcohol use: No   Drug use: No    No past surgical history on file.  Family History  Problem Relation Age of Onset   Other Mother        uknown   Hypertension Father    Other Father        rhabdolmylosis   Diabetes Father    Cancer Maternal Grandmother    Cancer Paternal Grandmother    Colon cancer Neg Hx    Colon polyps Neg Hx    Esophageal cancer Neg Hx    Rectal cancer Neg Hx    Stomach cancer Neg Hx     Allergies  Allergen Reactions   Aspirin Other (See Comments)    Dizziness and fainting, this was a childhood allergy    Current Medications:   Current Outpatient Medications:    albuterol (PROVENTIL HFA;VENTOLIN HFA) 108 (90 BASE) MCG/ACT inhaler, Inhale 2 puffs into the lungs every 4 (four) hours as needed. For wheezing., Disp: , Rfl:    carvedilol (COREG) 25 MG tablet, Take 1 tablet (25 mg total) by mouth 2 (two) times daily with a meal., Disp: 60 tablet, Rfl: 2   Cholecalciferol (VITAMIN D3) 50 MCG (2000 UT) capsule, Take 2,000 Units by mouth daily., Disp: , Rfl:    cloNIDine (CATAPRES) 0.1 MG tablet, Take 1 tablet (0.1 mg total) by mouth 3 (three) times daily., Disp: 90 tablet, Rfl: 2   FEROSUL 325 (65 Fe) MG tablet, Take 325 mg by mouth daily.,  Disp: , Rfl:    hydrALAZINE (APRESOLINE) 100 MG tablet, Take 1 tablet (100 mg total) by mouth every 8 (eight) hours., Disp: 90 tablet, Rfl: 2   rosuvastatin (CRESTOR) 40 MG tablet, Take 1 tablet (40 mg total) by mouth daily., Disp: 30 tablet, Rfl: 2   Semaglutide,0.25 or 0.5MG /DOS, (OZEMPIC, 0.25 OR 0.5 MG/DOSE,) 2 MG/3ML SOPN, Inject 0.25 mg into the skin once a week., Disp: 3 mL, Rfl: 2   Review of Systems:   Negative unless otherwise specified per HPI.  Vitals:   There were no vitals filed for this visit.   There is no height or weight on file to calculate BMI.  Physical Exam:   Physical Exam  Assessment and Plan:   There are no diagnoses linked to this encounter.   I, Isabelle Course, acting as a Neurosurgeon for Jarold Motto, [place]., have documented all relevant documentation on the behalf of Jarold Motto, [place], as directed by  Jarold Motto, PA while in the presence of Jarold Motto, [place].  I, Isabelle Course, have reviewed all documentation for this visit. The documentation on 01/02/24 for the exam, diagnosis, procedures, and orders are  all accurate and complete.  Jarold Motto, PA-C

## 2024-01-03 ENCOUNTER — Emergency Department (HOSPITAL_BASED_OUTPATIENT_CLINIC_OR_DEPARTMENT_OTHER): Admitting: Radiology

## 2024-01-03 ENCOUNTER — Other Ambulatory Visit: Payer: Self-pay

## 2024-01-03 ENCOUNTER — Encounter (HOSPITAL_BASED_OUTPATIENT_CLINIC_OR_DEPARTMENT_OTHER): Payer: Self-pay | Admitting: Emergency Medicine

## 2024-01-03 ENCOUNTER — Ambulatory Visit: Admitting: Physician Assistant

## 2024-01-03 ENCOUNTER — Observation Stay (HOSPITAL_BASED_OUTPATIENT_CLINIC_OR_DEPARTMENT_OTHER)
Admission: EM | Admit: 2024-01-03 | Discharge: 2024-01-04 | Disposition: A | Discharge status: Home / Self Care | Attending: Internal Medicine | Admitting: Internal Medicine

## 2024-01-03 VITALS — BP 220/98 | HR 110 | Temp 97.9°F | Ht 67.0 in | Wt 225.4 lb

## 2024-01-03 DIAGNOSIS — Z794 Long term (current) use of insulin: Secondary | ICD-10-CM

## 2024-01-03 DIAGNOSIS — E1169 Type 2 diabetes mellitus with other specified complication: Secondary | ICD-10-CM | POA: Diagnosis not present

## 2024-01-03 DIAGNOSIS — I160 Hypertensive urgency: Secondary | ICD-10-CM | POA: Insufficient documentation

## 2024-01-03 DIAGNOSIS — N185 Chronic kidney disease, stage 5: Secondary | ICD-10-CM

## 2024-01-03 DIAGNOSIS — J45909 Unspecified asthma, uncomplicated: Secondary | ICD-10-CM | POA: Insufficient documentation

## 2024-01-03 DIAGNOSIS — Z7985 Long-term (current) use of injectable non-insulin antidiabetic drugs: Secondary | ICD-10-CM | POA: Diagnosis not present

## 2024-01-03 DIAGNOSIS — E1122 Type 2 diabetes mellitus with diabetic chronic kidney disease: Secondary | ICD-10-CM

## 2024-01-03 DIAGNOSIS — I120 Hypertensive chronic kidney disease with stage 5 chronic kidney disease or end stage renal disease: Secondary | ICD-10-CM | POA: Diagnosis present

## 2024-01-03 DIAGNOSIS — E66812 Obesity, class 2: Secondary | ICD-10-CM | POA: Diagnosis not present

## 2024-01-03 DIAGNOSIS — I161 Hypertensive emergency: Principal | ICD-10-CM

## 2024-01-03 DIAGNOSIS — I10 Essential (primary) hypertension: Secondary | ICD-10-CM

## 2024-01-03 DIAGNOSIS — E785 Hyperlipidemia, unspecified: Secondary | ICD-10-CM

## 2024-01-03 DIAGNOSIS — D631 Anemia in chronic kidney disease: Secondary | ICD-10-CM | POA: Insufficient documentation

## 2024-01-03 DIAGNOSIS — N184 Chronic kidney disease, stage 4 (severe): Secondary | ICD-10-CM

## 2024-01-03 DIAGNOSIS — Z79899 Other long term (current) drug therapy: Secondary | ICD-10-CM | POA: Insufficient documentation

## 2024-01-03 LAB — POCT GLYCOSYLATED HEMOGLOBIN (HGB A1C): HbA1c POC (<> result, manual entry): 5.6 % (ref 4.0–5.6)

## 2024-01-03 LAB — CBC WITH DIFFERENTIAL/PLATELET
Abs Immature Granulocytes: 0.02 10*3/uL (ref 0.00–0.07)
Basophils Absolute: 0.1 10*3/uL (ref 0.0–0.1)
Basophils Relative: 1 %
Eosinophils Absolute: 0.2 10*3/uL (ref 0.0–0.5)
Eosinophils Relative: 2 %
HCT: 30.7 % — ABNORMAL LOW (ref 39.0–52.0)
Hemoglobin: 9.9 g/dL — ABNORMAL LOW (ref 13.0–17.0)
Immature Granulocytes: 0 %
Lymphocytes Relative: 11 %
Lymphs Abs: 1.1 10*3/uL (ref 0.7–4.0)
MCH: 29.7 pg (ref 26.0–34.0)
MCHC: 32.2 g/dL (ref 30.0–36.0)
MCV: 92.2 fL (ref 80.0–100.0)
Monocytes Absolute: 0.5 10*3/uL (ref 0.1–1.0)
Monocytes Relative: 6 %
Neutro Abs: 7.5 10*3/uL (ref 1.7–7.7)
Neutrophils Relative %: 80 %
Platelets: 263 10*3/uL (ref 150–400)
RBC: 3.33 MIL/uL — ABNORMAL LOW (ref 4.22–5.81)
RDW: 12.6 % (ref 11.5–15.5)
WBC: 9.3 10*3/uL (ref 4.0–10.5)
nRBC: 0 % (ref 0.0–0.2)

## 2024-01-03 LAB — COMPREHENSIVE METABOLIC PANEL WITH GFR
ALT: 17 U/L (ref 0–44)
AST: 15 U/L (ref 15–41)
Albumin: 4 g/dL (ref 3.5–5.0)
Alkaline Phosphatase: 63 U/L (ref 38–126)
Anion gap: 10 (ref 5–15)
BUN: 58 mg/dL — ABNORMAL HIGH (ref 6–20)
CO2: 22 mmol/L (ref 22–32)
Calcium: 8.2 mg/dL — ABNORMAL LOW (ref 8.9–10.3)
Chloride: 107 mmol/L (ref 98–111)
Creatinine, Ser: 6.71 mg/dL — ABNORMAL HIGH (ref 0.61–1.24)
GFR, Estimated: 9 mL/min — ABNORMAL LOW (ref >60)
Glucose, Bld: 144 mg/dL — ABNORMAL HIGH (ref 70–99)
Potassium: 4.6 mmol/L (ref 3.5–5.1)
Sodium: 139 mmol/L (ref 135–145)
Total Bilirubin: 0.3 mg/dL (ref 0.0–1.2)
Total Protein: 7.2 g/dL (ref 6.5–8.1)

## 2024-01-03 LAB — TROPONIN I (HIGH SENSITIVITY)
Troponin I (High Sensitivity): 46 ng/L — ABNORMAL HIGH (ref <18)
Troponin I (High Sensitivity): 49 ng/L — ABNORMAL HIGH (ref <18)

## 2024-01-03 LAB — GLUCOSE, CAPILLARY: Glucose-Capillary: 150 mg/dL — ABNORMAL HIGH (ref 70–99)

## 2024-01-03 LAB — D-DIMER, QUANTITATIVE: D-Dimer, Quant: 1.08 ug{FEU}/mL — ABNORMAL HIGH (ref 0.00–0.50)

## 2024-01-03 MED ORDER — ROSUVASTATIN CALCIUM 40 MG PO TABS: 40.0000 mg | ORAL_TABLET | Freq: Every day | ORAL | 1 refills | Status: DC

## 2024-01-03 MED ORDER — ACETAMINOPHEN 650 MG RE SUPP: 650.0000 mg | Freq: Four times a day (QID) | RECTAL | Status: DC | PRN

## 2024-01-03 MED ORDER — LABETALOL HCL 5 MG/ML IV SOLN
5.0000 mg | Freq: Once | INTRAVENOUS | Status: AC
  Administered 2024-01-03: 5 mg via INTRAVENOUS
  Filled 2024-01-03: qty 4

## 2024-01-03 MED ORDER — CARVEDILOL 25 MG PO TABS
25.0000 mg | ORAL_TABLET | Freq: Two times a day (BID) | ORAL | Status: DC
  Administered 2024-01-03: 25 mg via ORAL
  Filled 2024-01-03: qty 1

## 2024-01-03 MED ORDER — INSULIN ASPART 100 UNIT/ML IJ SOLN: 0.0000 [IU] | Freq: Three times a day (TID) | INTRAMUSCULAR | Status: DC

## 2024-01-03 MED ORDER — ONDANSETRON HCL 4 MG PO TABS: 4.0000 mg | ORAL_TABLET | Freq: Four times a day (QID) | ORAL | Status: DC | PRN

## 2024-01-03 MED ORDER — HYDRALAZINE HCL 100 MG PO TABS: 100.0000 mg | ORAL_TABLET | Freq: Three times a day (TID) | ORAL | 1 refills | Status: DC

## 2024-01-03 MED ORDER — AMLODIPINE BESYLATE 10 MG PO TABS
10.0000 mg | ORAL_TABLET | Freq: Every day | ORAL | Status: DC
  Administered 2024-01-03 – 2024-01-04 (×2): 10 mg via ORAL
  Filled 2024-01-03 (×2): qty 1

## 2024-01-03 MED ORDER — CARVEDILOL 25 MG PO TABS: 25.0000 mg | ORAL_TABLET | Freq: Two times a day (BID) | ORAL | 1 refills | Status: DC

## 2024-01-03 MED ORDER — ROSUVASTATIN CALCIUM 20 MG PO TABS
40.0000 mg | ORAL_TABLET | Freq: Every day | ORAL | Status: DC
  Administered 2024-01-03 – 2024-01-04 (×2): 40 mg via ORAL
  Filled 2024-01-03 (×2): qty 2

## 2024-01-03 MED ORDER — ACETAMINOPHEN 325 MG PO TABS: 650.0000 mg | ORAL_TABLET | Freq: Four times a day (QID) | ORAL | Status: DC | PRN

## 2024-01-03 MED ORDER — OZEMPIC (0.25 OR 0.5 MG/DOSE) 2 MG/3ML ~~LOC~~ SOPN
0.2500 mg | PEN_INJECTOR | SUBCUTANEOUS | 2 refills | Status: DC
Start: 2024-01-03 — End: 2024-06-27

## 2024-01-03 MED ORDER — AMLODIPINE BESYLATE 10 MG PO TABS: 10.0000 mg | ORAL_TABLET | Freq: Every day | ORAL | 0 refills | Status: DC

## 2024-01-03 MED ORDER — INSULIN ASPART 100 UNIT/ML IJ SOLN: 0.0000 [IU] | Freq: Every day | INTRAMUSCULAR | Status: DC

## 2024-01-03 MED ORDER — ONDANSETRON HCL 4 MG/2ML IJ SOLN: 4.0000 mg | Freq: Four times a day (QID) | INTRAMUSCULAR | Status: DC | PRN

## 2024-01-03 MED ORDER — HYDRALAZINE HCL 50 MG PO TABS
100.0000 mg | ORAL_TABLET | Freq: Three times a day (TID) | ORAL | Status: DC
  Administered 2024-01-03 – 2024-01-04 (×3): 100 mg via ORAL
  Filled 2024-01-03 (×3): qty 2

## 2024-01-03 MED ORDER — SODIUM CHLORIDE 0.9 % IV BOLUS
1000.0000 mL | Freq: Once | INTRAVENOUS | Status: AC
  Administered 2024-01-03: 1000 mL via INTRAVENOUS

## 2024-01-03 NOTE — Patient Instructions (Signed)
 It was great to see you!  I will refill all medications and restart amlodipine 10 mg daily  I need you to go to the ER for evaluation for blood work, imaging/EKG and follow-up with me in 1-2 weeks  Call your kidney doctor and heart doctor as soon as possible to get an appointment so we can stop sending you to the ER   Let's follow-up in 1-2 weeks, sooner if you have concerns.  Take care,  Jarold Motto PA-C

## 2024-01-03 NOTE — ED Triage Notes (Signed)
 Pt referred by pcp for htn. Pt was being seen for medication check

## 2024-01-03 NOTE — Hospital Course (Signed)
 53 y.o. M with HTN, CKD V baseline Cr 6.2 who presented with hypertensive urgency.  Has been feeling well.  Went to his PCP's office today for a routine visit, BP was >200, so he was sent to the ER.  In the ER, CXR clear, Cr at recent baseline.  No angina, no clinical HF.  Troponin minimally elevated due to CKD.  Given labetalol (and a liter of fluids) and sent to the hospital for observation overnight for hypertensive urgency.

## 2024-01-03 NOTE — ED Notes (Signed)
 Called Charleigh at [facility] for transport

## 2024-01-03 NOTE — ED Provider Notes (Signed)
  EMERGENCY DEPARTMENT AT [facility] Provider Note   CSN: [id number] Arrival date & time: 01/03/24  1008     History  Chief Complaint  Patient presents with   Hypertension    Luis Wood is a 53 y.o. male with a history of hypertension, stage IV CKD, and diabetes mellitus who presents the ED today for elevated blood pressure.  Patient was at his primary care office today for routine checkup but was advised to come here when his blood pressure read 220/98 with a heart rate of 110 bpm.  Patient states he gets anxious when going to doctors offices and thinks that is why his blood pressure could be elevated.  He did take his hydralazine and carvedilol this morning.  States his blood pressures are usually in the 170s over 90s range at home when he checks on his Apple Watch.  Denies any chest pain, shortness of breath, headaches, vision changes, or lightheadedness.  Endorses an episode of orthopnea yesterday.  Triage notes mentions patient is having a syncopal event, which patient denies.  No lower extremity pain or swelling, long travels, hospitalizations, or surgeries.  No additional complaints or concerns at this time.    Home Medications Prior to Admission medications   Medication Sig Start Date End Date Taking? Authorizing Provider  albuterol (PROVENTIL HFA;VENTOLIN HFA) 108 (90 BASE) MCG/ACT inhaler Inhale 2 puffs into the lungs every 4 (four) hours as needed. For wheezing.   Yes [provider]  amLODipine (NORVASC) 10 MG tablet Take 1 tablet (10 mg total) by mouth daily. 01/03/24  Yes Alexander Iba, PA  carvedilol (COREG) 25 MG tablet Take 1 tablet (25 mg total) by mouth 2 (two) times daily with a meal. 01/03/24  Yes Alexander Iba, PA  Cholecalciferol (VITAMIN D3) 50 MCG (2000 UT) capsule Take 2,000 Units by mouth daily. 06/19/22  Yes [provider]  hydrALAZINE (APRESOLINE) 100 MG tablet Take 1 tablet (100 mg total) by mouth every 8 (eight)  hours. 01/03/24  Yes Alexander Iba, PA  rosuvastatin (CRESTOR) 40 MG tablet Take 1 tablet (40 mg total) by mouth daily. 01/03/24  Yes Worley, Ova Bloomer, PA  Semaglutide,0.25 or 0.5MG /DOS, (OZEMPIC, 0.25 OR 0.5 MG/DOSE,) 2 MG/3ML SOPN Inject 0.25 mg into the skin once a week. 01/03/24  Yes Alexander Iba, PA  cloNIDine (CATAPRES) 0.1 MG tablet Take 1 tablet (0.1 mg total) by mouth 3 (three) times daily. Patient not taking: Reported on 01/03/2024 09/16/23   Demaris Fillers, MD      Allergies    Aspirin    Review of Systems   Review of Systems  Cardiovascular:        High blood pressure  All other systems reviewed and are negative.   Physical Exam Updated Vital Signs BP (!) 214/102   Pulse 94   Temp 98.5 F (36.9 C) (Oral)   Resp 18   Ht 5\' 7"  (1.702 m)   Wt 102.5 kg   SpO2 100%   BMI 35.39 kg/m  Physical Exam Vitals and nursing note reviewed.  Constitutional:      General: He is not in acute distress.    Appearance: Normal appearance.  HENT:     Head: Normocephalic and atraumatic.     Mouth/Throat:     Mouth: Mucous membranes are moist.  Eyes:     Conjunctiva/sclera: Conjunctivae normal.     Pupils: Pupils are equal, round, and reactive to light.  Cardiovascular:     Rate and Rhythm: Regular rhythm.  Tachycardia present.     Pulses: Normal pulses.     Heart sounds: Normal heart sounds.  Pulmonary:     Effort: Pulmonary effort is normal.     Breath sounds: Normal breath sounds.  Abdominal:     Palpations: Abdomen is soft.     Tenderness: There is no abdominal tenderness.  Musculoskeletal:        General: Normal range of motion.     Cervical back: Normal range of motion.     Right lower leg: No edema.     Left lower leg: No edema.  Skin:    General: Skin is warm and dry.     Findings: No rash.  Neurological:     General: No focal deficit present.     Mental Status: He is alert.     Sensory: No sensory deficit.     Motor: No weakness.  Psychiatric:        Mood  and Affect: Mood normal.        Behavior: Behavior normal.    ED Results / Procedures / Treatments   Labs (all labs ordered are listed, but only abnormal results are displayed) Labs Reviewed  COMPREHENSIVE METABOLIC PANEL WITH GFR - Abnormal; Notable for the following components:      Result Value   Glucose, Bld 144 (*)    BUN 58 (*)    Creatinine, Ser 6.71 (*)    Calcium 8.2 (*)    GFR, Estimated 9 (*)    All other components within normal limits  CBC WITH DIFFERENTIAL/PLATELET - Abnormal; Notable for the following components:   RBC 3.33 (*)    Hemoglobin 9.9 (*)    HCT 30.7 (*)    All other components within normal limits  D-DIMER, QUANTITATIVE - Abnormal; Notable for the following components:   D-Dimer, Quant 1.08 (*)    All other components within normal limits  TROPONIN I (HIGH SENSITIVITY) - Abnormal; Notable for the following components:   Troponin I (High Sensitivity) 46 (*)    All other components within normal limits  TROPONIN I (HIGH SENSITIVITY) - Abnormal; Notable for the following components:   Troponin I (High Sensitivity) 49 (*)    All other components within normal limits    EKG EKG Interpretation Date/Time:  Thursday January 03 2024 10:16:54 EDT Ventricular Rate:  104 PR Interval:  158 QRS Duration:  74 QT Interval:  332 QTC Calculation: 436 R Axis:   4  Text Interpretation: Sinus tachycardia Minimal voltage criteria for LVH, may be normal variant ( R in aVL ) ST & T wave abnormality, consider inferolateral ischemia Abnormal ECG When compared with ECG of 05-Oct-2023 14:09, PREVIOUS ECG IS PRESENT similar to prior EKG Confirmed by Hershel Los [phone]) on 01/03/2024 11:06:23 AM  Radiology DG Chest 2 View Result Date: 01/03/2024 CLINICAL DATA:  Hypertension EXAM: CHEST - 2 VIEW COMPARISON:  09/14/2023 FINDINGS: The heart size and mediastinal contours are within normal limits. Both lungs are clear. The visualized skeletal structures are unremarkable.  IMPRESSION: No active cardiopulmonary disease. Electronically Signed   By: Janeece Mechanic M.D.   On: 01/03/2024 12:17    Procedures .Critical Care  Performed by: Sonnie Dusky, PA-C Authorized by: Sonnie Dusky, PA-C   Critical care provider statement:    Critical care time (minutes):  35   Critical care was necessary to treat or prevent imminent or life-threatening deterioration of the following conditions:  Cardiac failure   Critical care was time spent personally by me  on the following activities:  Blood draw for specimens, development of treatment plan with patient or surrogate, discussions with consultants, evaluation of patient's response to treatment, examination of patient, review of old charts, re-evaluation of patient's condition, pulse oximetry, ordering and review of radiographic studies, ordering and review of laboratory studies and ordering and performing treatments and interventions   Care discussed with: admitting provider       Medications Ordered in ED Medications  labetalol (NORMODYNE) injection 5 mg (5 mg Intravenous Given 01/03/24 1103)  sodium chloride 0.9 % bolus 1,000 mL (0 mLs Intravenous Stopped 01/03/24 1315)    ED Course/ Medical Decision Making/ A&P                                 Medical Decision Making Amount and/or Complexity of Data Reviewed Labs: ordered. Radiology: ordered.  Risk Prescription drug management. Decision regarding hospitalization.   This patient presents to the ED for concern of hypertension, this involves an extensive number of treatment options, and is a complaint that carries with it a high risk of complications and morbidity.   Differential diagnosis includes: hypertensive emergency, hypertensive urgency,    Comorbidities  See HPI above   Additional History  Additional history obtained from primary care note and prior ED visits.   Cardiac Monitoring / EKG  The patient was maintained on a cardiac monitor.  I  personally viewed and interpreted the cardiac monitored which showed: sinus tachycardia with a heart rate of 104 bpm.   Lab Tests  I ordered and personally interpreted labs.  The pertinent results include:   Initial troponin of 46, D-dimer of 1.08, BUN of 58, creatinine of 6.71, GFR of 9 on CMP - history of stage 4 CKD but BUN and Cr are more elevated than normal  CBC is within normal limits for patient   Imaging Studies  I ordered imaging studies including CXR  I independently visualized and interpreted imaging which showed:  No active cardiopulmonary disease I agree with the radiologist interpretation   Consultations  I requested consultation with Dr. Christianne Cowper with nephrology,  and discussed lab and imaging findings as well as pertinent plan - they recommend: their service will consult if we admit I requested consultation with Dr. Rufina Cough with [facility],  and discussed lab and imaging findings as well as pertinent plan - they recommend: admission for further work-up and evaluation   Problem List / ED Course / Critical Interventions / Medication Management  Patient reports that he was sent over by his primary care for having elevated blood pressure in office.  Blood pressure was initially 240/100 with a pulse of 110 bpm.  Patient takes hydralazine and carvedilol regularly for his blood pressure and did take it this morning prior to arrival at PCP office.  He states he has a history of whitecoat hypertension.  He felt anxious going to his primary care office and does not know if that could be associated with his elevated blood pressures. In the ED after getting labetalol BP dropped to 188/100.  Did not want to give patient more blood pressure medication and drop his blood pressure too low since he is most likely elevated at baseline.  Patient states that his blood pressures are usually 170/100 at home when he takes them on his Apple Watch. No chest pain, shortness of breath, headaches, or vision  changes.  Did have an episode of orthopnea yesterday and did again  while in the ED today.  Had an elevated D-dimer but CTA PE study could not be performed due to patient's GFR being 9.   He has not seen a nephrologist in the past 2 years and is not on hemodialysis currently.  His BUN and creatinine are increased compared to last labs that were drawn 3 months ago.  Suspicious for hypertensive emergency. Patient staffed with my attending, Dr. Nolia Baumgartner. I ordered medications including: Labetalol for hypertension  NS for elevated kidney function Reevaluation of the patient after these medicines showed that the patient improved some. I have reviewed the patients home medicines and have made adjustments as needed Patient ambulated in the ED - HR elevated at 106 bpm with oxygen saturation of 96% on room air.   Social Determinants of Health  Physical activity   Test / Admission - Considered  Discussed findings with patient. He is agreeable with plan for admission.       Final Clinical Impression(s) / ED Diagnoses Final diagnoses:  Hypertensive emergency    Rx / DC Orders ED Discharge Orders     None         Sonnie Dusky, PA-C 01/03/24 1731    Hershel Los, MD 01/06/24 2251

## 2024-01-03 NOTE — Plan of Care (Signed)

## 2024-01-03 NOTE — Assessment & Plan Note (Signed)
 BMI >35 with comorbid HTN, DM

## 2024-01-03 NOTE — Progress Notes (Signed)
 Patient admitted to room 5W 29.

## 2024-01-03 NOTE — Assessment & Plan Note (Signed)
Cr stable relative to baseline 

## 2024-01-03 NOTE — Assessment & Plan Note (Signed)
 One episode of brief SOB with exertion yesterday, but otherwise no angina, no exertional symptoms, no swelling, no orthopnea.  Tni low and flat due to poor clearance.    At baseline BP is 170s he reports. He takes hyrdalazine, Coreg and clonidine nightly.  Has intolerance to amlodipine.  Is concerned about ED side effects. - Resume home hydralazine and Coreg - Resume amlodipine - Stop clonidine - Plan to bring down BP slowly, can start a diuretic at discharge or Nephrology follow up

## 2024-01-03 NOTE — ED Notes (Signed)
 [facility] at bedside

## 2024-01-03 NOTE — Assessment & Plan Note (Signed)
 Cr 3.5-4.5 early last year, by last Dec had stabilized at 6 mg/dL (see admission [facility] Dec 2024).  Stable since then.  No uremia, no hyperkalemia, CXR clear.  No indication for urgent HD. - Follow up with Nephrology

## 2024-01-03 NOTE — ED Notes (Addendum)
 Charting error.

## 2024-01-03 NOTE — H&P (Signed)
 History and Physical    Patient: Luis Wood ZOX:[id number] DOB: Jan 06, 1971 DOA: 01/03/2024 DOS: the patient was seen and examined on 01/03/2024 PCP: Jarold Motto, PA  Patient coming from: Home  Chief Complaint:  Chief Complaint  Patient presents with   Hypertension       HPI:  53 y.o. M with HTN, CKD V baseline Cr 6.2 who presented with hypertensive urgency.  Has been feeling well.  Went to his PCP's office today for a routine visit, BP was >200, so he was sent to the ER.  In the ER, CXR clear, Cr at recent baseline.  No angina, no clinical HF.  Troponin minimally elevated due to CKD.  Given labetalol (and a liter of fluids) and sent to the hospital for observation overnight for hypertensive urgency.      Review of Systems  Respiratory:  Negative for cough and shortness of breath.   Cardiovascular:  Negative for chest pain, palpitations, orthopnea, leg swelling and PND.  Genitourinary:        Normal urine output  Neurological:  Negative for sensory change, speech change, focal weakness, seizures, loss of consciousness, weakness and headaches.  All other systems reviewed and are negative.    Past Medical History:  Diagnosis Date   Asthma    Diabetes mellitus without complication (HCC)    Hypertension    Hypertensive emergency 09/14/2023   History reviewed. No pertinent surgical history. Social History:  reports that he has never smoked. He has never used smokeless tobacco. He reports that he does not drink alcohol and does not use drugs.  Allergies  Allergen Reactions   Aspirin Other (See Comments)    Dizziness and fainting, this was a childhood allergy    Family History  Problem Relation Age of Onset   Other Mother        uknown   Hypertension Father    Other Father        rhabdolmylosis   Diabetes Father    Cancer Maternal Grandmother    Cancer Paternal Grandmother    Colon cancer Neg Hx    Colon polyps Neg Hx    Esophageal cancer Neg Hx     Rectal cancer Neg Hx    Stomach cancer Neg Hx     Prior to Admission medications   Medication Sig Start Date End Date Taking? Authorizing Provider  albuterol (PROVENTIL HFA;VENTOLIN HFA) 108 (90 BASE) MCG/ACT inhaler Inhale 2 puffs into the lungs every 4 (four) hours as needed. For wheezing.   Yes [provider]  amLODipine (NORVASC) 10 MG tablet Take 1 tablet (10 mg total) by mouth daily. 01/03/24  Yes Jarold Motto, PA  carvedilol (COREG) 25 MG tablet Take 1 tablet (25 mg total) by mouth 2 (two) times daily with a meal. 01/03/24  Yes Jarold Motto, PA  Cholecalciferol (VITAMIN D3) 50 MCG (2000 UT) capsule Take 2,000 Units by mouth daily. 06/19/22  Yes [provider]  hydrALAZINE (APRESOLINE) 100 MG tablet Take 1 tablet (100 mg total) by mouth every 8 (eight) hours. 01/03/24  Yes Jarold Motto, PA  rosuvastatin (CRESTOR) 40 MG tablet Take 1 tablet (40 mg total) by mouth daily. 01/03/24  Yes Worley, Lelon Mast, PA  Semaglutide,0.25 or 0.5MG /DOS, (OZEMPIC, 0.25 OR 0.5 MG/DOSE,) 2 MG/3ML SOPN Inject 0.25 mg into the skin once a week. 01/03/24  Yes Jarold Motto, [place]    Physical Exam: Vitals:   01/03/24 1445 01/03/24 1500 01/03/24 1619 01/03/24 1632  BP: (!) 184/94 (!) 187/93 Marland Kitchen)  218/105 (!) 214/102  Pulse: 92 94 92 94  Resp: 19 13 10 18   Temp:   98.5 F (36.9 C)   TempSrc:   Oral   SpO2: 96% 97% 98% 100%  Weight:    102.5 kg  Height:    5\' 7"  (1.702 m)   Adult male, sitting up in bed, interactive and appropriate RRR, no murmurs, no peripheral edema Respiratory rate normal, lungs clear without rales or wheezes Abdomen soft without tenderness palpation or guarding, no ascites or distention Attention normal, affect normal, judgment insight appear normal, face symmetric, speech fluent, oriented to person, place, and time, strength symmetric in both upper and lower extremities bilaterally    Data Reviewed: Discussed with nephrology Basic metabolic panel notable  for creatinine 6.7, potassium normal, bicarb normal BUN up to 58 CBC shows mild normocytic anemia Troponin low in fat D-dimer minimally elevated, likely due to poor clearance Chest x-ray, personally reviewed, clear ECG, shows nonspecific T wave changes, LVH Echocardiogram from last December shows severe LVH, normal EF    Assessment and Plan: * Hypertensive urgency One episode of brief SOB with exertion yesterday, but otherwise no angina, no exertional symptoms, no swelling, no orthopnea.  Tni low and flat due to poor clearance.    At baseline BP is 170s he reports. He takes hyrdalazine, Coreg and clonidine nightly.  Has intolerance to amlodipine.  Is concerned about ED side effects. - Resume home hydralazine and Coreg - Resume amlodipine - Stop clonidine - Plan to bring down BP slowly, can start a diuretic at discharge or Nephrology follow up    CKD (chronic kidney disease), stage V (HCC) Cr 3.5-4.5 early last year, by last Dec had stabilized at 6 mg/dL (see admission [facility] Dec 2024).  Stable since then.  No uremia, no hyperkalemia, CXR clear.  No indication for urgent HD. - Follow up with Nephrology   Morbid obesity BMI >35 with comorbid HTN, DM  Anemia due to chronic kidney disease Cr stable relative to baseline   Type 2 diabetes mellitus with diabetic chronic kidney disease (HCC) Glucose controlled, on Ozempic only. - SS corrections here         Advance Care Planning: Full code  Consults: Discussed with nephrology, they recommended observation overnight, call back in the morning if questions  Family Communication: None present  Severity of Illness: The appropriate patient status for this patient is OBSERVATION. Observation status is judged to be reasonable and necessary in order to provide the required intensity of service to ensure the patient's safety. The patient's presenting symptoms, physical exam findings, and initial radiographic and laboratory data  in the context of their medical condition is felt to place them at decreased risk for further clinical deterioration. Furthermore, it is anticipated that the patient will be medically stable for discharge from the hospital within 2 midnights of admission.   Author: Alberteen Sam, MD 01/03/2024 5:54 PM  For on call review [email or web address].

## 2024-01-03 NOTE — Assessment & Plan Note (Signed)
 Glucose controlled, on Ozempic only. - SS corrections here

## 2024-01-04 ENCOUNTER — Other Ambulatory Visit (HOSPITAL_COMMUNITY): Payer: Self-pay

## 2024-01-04 DIAGNOSIS — I160 Hypertensive urgency: Secondary | ICD-10-CM | POA: Diagnosis not present

## 2024-01-04 LAB — BASIC METABOLIC PANEL WITH GFR
Anion gap: 10 (ref 5–15)
BUN: 56 mg/dL — ABNORMAL HIGH (ref 6–20)
CO2: 18 mmol/L — ABNORMAL LOW (ref 22–32)
Calcium: 8.4 mg/dL — ABNORMAL LOW (ref 8.9–10.3)
Chloride: 111 mmol/L (ref 98–111)
Creatinine, Ser: 6.54 mg/dL — ABNORMAL HIGH (ref 0.61–1.24)
GFR, Estimated: 10 mL/min — ABNORMAL LOW (ref >60)
Glucose, Bld: 103 mg/dL — ABNORMAL HIGH (ref 70–99)
Potassium: 4.3 mmol/L (ref 3.5–5.1)
Sodium: 139 mmol/L (ref 135–145)

## 2024-01-04 LAB — CBC
HCT: 27.8 % — ABNORMAL LOW (ref 39.0–52.0)
Hemoglobin: 9 g/dL — ABNORMAL LOW (ref 13.0–17.0)
MCH: 29.7 pg (ref 26.0–34.0)
MCHC: 32.4 g/dL (ref 30.0–36.0)
MCV: 91.7 fL (ref 80.0–100.0)
Platelets: 238 10*3/uL (ref 150–400)
RBC: 3.03 MIL/uL — ABNORMAL LOW (ref 4.22–5.81)
RDW: 12.6 % (ref 11.5–15.5)
WBC: 8.8 10*3/uL (ref 4.0–10.5)
nRBC: 0 % (ref 0.0–0.2)

## 2024-01-04 MED ORDER — CARVEDILOL 25 MG PO TABS
37.5000 mg | ORAL_TABLET | Freq: Two times a day (BID) | ORAL | Status: DC
  Administered 2024-01-04: 37.5 mg via ORAL
  Filled 2024-01-04: qty 1

## 2024-01-04 MED ORDER — ISOSORBIDE MONONITRATE ER 30 MG PO TB24
30.0000 mg | ORAL_TABLET | Freq: Every day | ORAL | 0 refills | Status: DC
  Filled 2024-01-04: qty 30, 30d supply, fill #0

## 2024-01-04 MED ORDER — CARVEDILOL 12.5 MG PO TABS
37.5000 mg | ORAL_TABLET | Freq: Two times a day (BID) | ORAL | 0 refills | Status: DC
  Filled 2024-01-04: qty 180, 30d supply, fill #0

## 2024-01-04 MED ORDER — ISOSORBIDE MONONITRATE ER 30 MG PO TB24
30.0000 mg | ORAL_TABLET | Freq: Every day | ORAL | Status: DC
  Administered 2024-01-04: 30 mg via ORAL
  Filled 2024-01-04: qty 1

## 2024-01-04 MED ORDER — ISOSORBIDE MONONITRATE ER 60 MG PO TB24: 60.0000 mg | ORAL_TABLET | Freq: Every day | ORAL | Status: DC

## 2024-01-04 MED ORDER — ISOSORBIDE MONONITRATE ER 30 MG PO TB24: 30.0000 mg | ORAL_TABLET | Freq: Every day | ORAL | Status: DC

## 2024-01-04 NOTE — Plan of Care (Signed)

## 2024-01-04 NOTE — TOC Transition Note (Addendum)
 Transition of Care [facility]) - Discharge Note   Patient Details  Name: Luis Wood MRN: [id number] Date of Birth: 1971/01/30  [facility] CM/SW Contact:  Gordy Clement, RN Phone Number: 01/04/2024, 11:44 AM   Clinical Narrative:     Patient will DC to home today No TOC needs have been identified. Patient is insured and does have a PCP.  Patient will follow up as directed on AVS   TOC signing off           Patient Goals and CMS Choice            Discharge Placement                       Discharge Plan and Services Additional resources added to the After Visit Summary for                                       Social Drivers of Health (SDOH) Interventions SDOH Screenings   Food Insecurity: No Food Insecurity (01/03/2024)  Housing: Low Risk  (01/03/2024)  Transportation Needs: No Transportation Needs (01/03/2024)  Utilities: Not At Risk (01/03/2024)  Depression (PHQ2-9): Low Risk  (02/07/2023)  Financial Resource Strain: Low Risk  (01/03/2024)  Physical Activity: Insufficiently Active (01/03/2024)  Social Connections: Moderately Integrated (01/03/2024)  Stress: No Stress Concern Present (01/03/2024)  Tobacco Use: Low Risk  (01/03/2024)     Readmission Risk Interventions     No data to display

## 2024-01-04 NOTE — Progress Notes (Addendum)
   01/04/24 1114  Mobility  Activity Ambulated independently in hallway  Level of Assistance Independent  Assistive Device None  Distance Ambulated (ft) 300 ft  Activity Response Tolerated fair  Mobility Referral Yes  Mobility visit 1 Mobility  Mobility Specialist Start Time (ACUTE ONLY) 1054  Mobility Specialist Stop Time (ACUTE ONLY) 1114  Mobility Specialist Time Calculation (min) (ACUTE ONLY) 20 min   Mobility Specialist: Progress Note- Visit:2  Pre-Mobility:      HR 100, SpO2 98% RA During Mobility: HR 117, SpO2 97% RA Post-Mobility:    HR 103, SpO2 98% RA  The following BP's were taken throughout visit: BP at rest 184/84 (113) BP at EOB 182/91 (115) BP at standing 162/ 88 (108)  BP after walking 192/91 (118)  Pt agreeable to mobility session - received in bed. Pt was asymptomatic throughout session with no complaints. Returned to EOB with all needs met - call bell within reach.  ___________________________________________________________________________ Pre-Mobility:      HR 97 During Mobility: HR 101 Post-Mobility:    HR 95  The following BP's were taken throughout visit: BP at rest 159/82 (103) BP at EOB 145/76 (95) BP at standing 132/74 (89)  BP after walking 152/86  (107)  Pt agreeable to mobility session - received in bed. Pt was asymptomatic throughout session with no complaints. Returned to EOB with all needs met - call bell within reach.   Barnie Mort, BS Mobility Specialist Please contact via SecureChat or  Rehab office at [phone].

## 2024-01-04 NOTE — Discharge Instructions (Signed)
 Follow with Primary MD Jarold Motto, PA in 3 days, follow-up with your nephrologist within a week of discharge.  Get CBC, CMP, 2 view Chest X ray -  checked next visit with your primary MD   Activity: As tolerated with Full fall precautions use walker/cane & assistance as needed  Disposition Home     Diet: Heart Healthy    Special Instructions: If you have smoked or chewed Tobacco  in the last 2 yrs please stop smoking, stop any regular Alcohol  and or any Recreational drug use.  On your next visit with your primary care physician please Get Medicines reviewed and adjusted.  Please request your Prim.MD to go over all Hospital Tests and Procedure/Radiological results at the follow up, please get all Hospital records sent to your Prim MD by signing hospital release before you go home.  If you experience worsening of your admission symptoms, develop shortness of breath, life threatening emergency, suicidal or homicidal thoughts you must seek medical attention immediately by calling 911 or calling your MD immediately  if symptoms less severe.  You Must read complete instructions/literature along with all the possible adverse reactions/side effects for all the Medicines you take and that have been prescribed to you. Take any new Medicines after you have completely understood and accpet all the possible adverse reactions/side effects.   Do not drive when taking Pain medications.  Do not take more than prescribed Pain, Sleep and Anxiety Medications  Wear Seat belts while driving.

## 2024-01-04 NOTE — Progress Notes (Signed)
 Patient discharge for home. Discharge education done by discharge RN.

## 2024-01-04 NOTE — Plan of Care (Signed)
                                      [facility]                            [address] [place], [place] [address]      Luis Wood was admitted to the Hospital on 01/03/2024 and Discharged  01/04/2024 and should be excused from work/school   for 4  days starting from date -  01/03/2024 , may return to work/school without any restrictions.  Call Susa Raring MD, [facility]  [phone] with questions.  Susa Raring M.D on 01/04/2024,at 2:01 PM  [facility]   Office  [phone]

## 2024-01-04 NOTE — Discharge Summary (Signed)
 Luis Wood ZOX:[id number] DOB: [birth year] DOA: 01/03/2024  PCP: Jarold Motto, PA  Admit date: 01/03/2024  Discharge date: 01/04/2024  Admitted From: Home   Disposition:  Home   Recommendations for Outpatient Follow-up:   Follow up with PCP in 1-2 weeks  PCP Please obtain BMP/CBC, 2 view CXR in 1week,  (see Discharge instructions)   PCP Please follow up on the following pending results:    Home Health: None   Equipment/Devices: None  Consultations: None  Discharge Condition: Stable    CODE STATUS: Full    Diet Recommendation: Heart Healthy     Chief Complaint  Patient presents with   Hypertension     Brief history of present illness from the day of admission and additional interim summary    53 y.o. M with HTN, CKD V baseline Cr 6.2 who presented with hypertensive urgency.   Has been feeling well.  Went to his PCP's office today for a routine visit, BP was >200, so he was sent to the ER.  In the ER, CXR clear, Cr at recent baseline.  No angina, no clinical HF.  Troponin minimally elevated due to CKD.   Given labetalol (and a liter of fluids) and sent to the hospital for observation overnight for hypertensive urgency.  Of note patient was on combination of hydralazine, high-dose Coreg and Catapres, he had stopped taking Catapres 5 days prior to admission as it was making him feel bad.                                                                   Hospital Course   Hypertensive urgency  One episode of brief SOB with exertion yesterday, but otherwise no angina, no exertional symptoms, no swelling, no orthopnea.  [place] due to abrupt stoppage of Catapres few days prior to hospital admission, counseled on compliance, medications adjusted, Coreg dose increased, continue home hydralazine dose and  added Imdur.  Blood pressure better he is completely symptom-free will be discharged home with outpatient follow-up with PCP and his nephrologist.  He had stopped following with his nephrologist but will call today and set up an appointment within a week.  Also requested to buy a home blood pressure device and monitor it twice a day and follow-up with PCP in 3 days with the results.   CKD (chronic kidney disease), stage V (HCC) Cr 3.5-4.5 early last year, by last Dec had stabilized at 6 mg/dL (see admission [facility] Dec 2024).  Stable since then.  No uremia, no hyperkalemia, CXR clear.  No indication for urgent HD. - Follow up with Nephrology outpatient as above.   Morbid obesity BMI >35 with comorbid HTN, DM, follow-up with PCP outpatient   Anemia due to chronic kidney disease Cr stable relative to baseline  Type 2 diabetes mellitus with diabetic chronic kidney disease (HCC) Glucose controlled, on Ozempic only. - Continue to follow-up with PCP for glycemic control and A1c monitoring.  Discharge diagnosis     Principal Problem:   Hypertensive urgency Active Problems:   Type 2 diabetes mellitus with diabetic chronic kidney disease (HCC)   Anemia due to chronic kidney disease   Morbid obesity   CKD (chronic kidney disease), stage V [facility])    Discharge instructions    Discharge Instructions     Discharge instructions   Complete by: As directed    Follow with Primary MD Jarold Motto, PA in 3 days, follow-up with your nephrologist within a week of discharge.  Get CBC, CMP, 2 view Chest X ray -  checked next visit with your primary MD   Activity: As tolerated with Full fall precautions use walker/cane & assistance as needed  Disposition Home     Diet: Heart Healthy    Special Instructions: If you have smoked or chewed Tobacco  in the last 2 yrs please stop smoking, stop any regular Alcohol  and or any Recreational drug use.  On your next visit with your primary care  physician please Get Medicines reviewed and adjusted.  Please request your Prim.MD to go over all Hospital Tests and Procedure/Radiological results at the follow up, please get all Hospital records sent to your Prim MD by signing hospital release before you go home.  If you experience worsening of your admission symptoms, develop shortness of breath, life threatening emergency, suicidal or homicidal thoughts you must seek medical attention immediately by calling 911 or calling your MD immediately  if symptoms less severe.  You Must read complete instructions/literature along with all the possible adverse reactions/side effects for all the Medicines you take and that have been prescribed to you. Take any new Medicines after you have completely understood and accpet all the possible adverse reactions/side effects.   Do not drive when taking Pain medications.  Do not take more than prescribed Pain, Sleep and Anxiety Medications  Wear Seat belts while driving.   Increase activity slowly   Complete by: As directed        Discharge Medications   Allergies as of 01/04/2024       Reactions   Aspirin Other (See Comments)   Dizziness and fainting, this was a childhood allergy        Medication List     TAKE these medications    albuterol 108 (90 Base) MCG/ACT inhaler Commonly known as: VENTOLIN HFA Inhale 2 puffs into the lungs every 4 (four) hours as needed. For wheezing.   amLODipine 10 MG tablet Commonly known as: NORVASC Take 1 tablet (10 mg total) by mouth daily.   carvedilol 12.5 MG tablet Commonly known as: COREG Take 3 tablets (37.5 mg total) by mouth 2 (two) times daily with a meal. What changed:  medication strength how much to take   hydrALAZINE 100 MG tablet Commonly known as: APRESOLINE Take 1 tablet (100 mg total) by mouth every 8 (eight) hours.   isosorbide mononitrate 30 MG 24 hr tablet Commonly known as: IMDUR Take 1 tablet (30 mg total) by mouth  daily. Start taking on: January 05, 2024   Ozempic (0.25 or 0.5 MG/DOSE) 2 MG/3ML Sopn Generic drug: Semaglutide(0.25 or 0.5MG /DOS) Inject 0.25 mg into the skin once a week.   rosuvastatin 40 MG tablet Commonly known as: Crestor Take 1 tablet (40 mg total) by mouth daily.   Vitamin  D3 50 MCG (2000 UT) capsule Take 2,000 Units by mouth daily.         Follow-up Information     Jarold Motto, [place]. Schedule an appointment as soon as possible for a visit in 3 day(s).   Specialty: Physician Assistant Why: Also follow-up with your nephrologist within a week of discharge. Contact information: [address] [place] [address] [phone]                 Major procedures and Radiology Reports - PLEASE review detailed and final reports thoroughly  -      DG Chest 2 View Result Date: 01/03/2024 CLINICAL DATA:  Hypertension EXAM: CHEST - 2 VIEW COMPARISON:  09/14/2023 FINDINGS: The heart size and mediastinal contours are within normal limits. Both lungs are clear. The visualized skeletal structures are unremarkable. IMPRESSION: No active cardiopulmonary disease. Electronically Signed   By: Charlett Nose M.D.   On: 01/03/2024 12:17    Micro Results    No results found for this or any previous visit (from the past 240 hours).  Today   Subjective    Aki Abalos today has no headache,no chest abdominal pain,no new weakness tingling or numbness, feels much better, says he feels great and would like to be discharged home as soon as possible.   Objective   Blood pressure 165/85, pulse 90, temperature 98.8 F (37.1 C), temperature source Oral, resp. rate 19, height 5\' 7"  (1.702 m), weight 102.5 kg, SpO2 98%.  No intake or output data in the 24 hours ending 01/04/24 1110  Exam  Awake Alert, No new F.N deficits,    [place].AT,PERRAL Supple Neck,   Symmetrical Chest wall movement, Good air movement bilaterally, CTAB RRR,No Gallops,   +ve B.Sounds, Abd Soft, Non  tender,  No Cyanosis, Clubbing or edema    Data Review   Recent Labs  Lab 01/03/24 1054 01/04/24 0620  WBC 9.3 8.8  HGB 9.9* 9.0*  HCT 30.7* 27.8*  PLT 263 238  MCV 92.2 91.7  MCH 29.7 29.7  MCHC 32.2 32.4  RDW 12.6 12.6  LYMPHSABS 1.1  --   MONOABS 0.5  --   EOSABS 0.2  --   BASOSABS 0.1  --     Recent Labs  Lab 01/03/24 0934 01/03/24 1054 01/04/24 0620  NA  --  139 139  K  --  4.6 4.3  CL  --  107 111  CO2  --  22 18*  ANIONGAP  --  10 10  GLUCOSE  --  144* 103*  BUN  --  58* 56*  CREATININE  --  6.71* 6.54*  AST  --  15  --   ALT  --  17  --   ALKPHOS  --  63  --   BILITOT  --  0.3  --   ALBUMIN  --  4.0  --   DDIMER  --  1.08*  --   HGBA1C 5.6  --   --   CALCIUM  --  8.2* 8.4*    Total Time in preparing paper work, data evaluation and todays exam - 35 minutes  Signature  -    Susa Raring M.D on 01/04/2024 at 11:10 AM   -  To page go to [email or web address]

## 2024-05-07 ENCOUNTER — Other Ambulatory Visit: Payer: Self-pay | Admitting: Physician Assistant

## 2024-06-27 ENCOUNTER — Other Ambulatory Visit: Payer: Self-pay | Admitting: Physician Assistant

## 2024-09-09 ENCOUNTER — Encounter (HOSPITAL_BASED_OUTPATIENT_CLINIC_OR_DEPARTMENT_OTHER): Payer: Self-pay

## 2024-09-09 ENCOUNTER — Emergency Department (HOSPITAL_BASED_OUTPATIENT_CLINIC_OR_DEPARTMENT_OTHER)

## 2024-09-09 ENCOUNTER — Other Ambulatory Visit: Payer: Self-pay

## 2024-09-09 ENCOUNTER — Inpatient Hospital Stay (HOSPITAL_BASED_OUTPATIENT_CLINIC_OR_DEPARTMENT_OTHER)
Admission: EM | Admit: 2024-09-09 | Discharge: 2024-09-11 | DRG: 177 | Disposition: A | Discharge status: Home / Self Care | Attending: Internal Medicine | Admitting: Internal Medicine

## 2024-09-09 ENCOUNTER — Inpatient Hospital Stay (HOSPITAL_COMMUNITY)

## 2024-09-09 DIAGNOSIS — Z7985 Long-term (current) use of injectable non-insulin antidiabetic drugs: Secondary | ICD-10-CM | POA: Diagnosis not present

## 2024-09-09 DIAGNOSIS — E785 Hyperlipidemia, unspecified: Secondary | ICD-10-CM | POA: Diagnosis not present

## 2024-09-09 DIAGNOSIS — E66812 Obesity, class 2: Secondary | ICD-10-CM | POA: Diagnosis present

## 2024-09-09 DIAGNOSIS — Z833 Family history of diabetes mellitus: Secondary | ICD-10-CM

## 2024-09-09 DIAGNOSIS — E1122 Type 2 diabetes mellitus with diabetic chronic kidney disease: Secondary | ICD-10-CM | POA: Diagnosis present

## 2024-09-09 DIAGNOSIS — Z79899 Other long term (current) drug therapy: Secondary | ICD-10-CM | POA: Diagnosis not present

## 2024-09-09 DIAGNOSIS — I120 Hypertensive chronic kidney disease with stage 5 chronic kidney disease or end stage renal disease: Secondary | ICD-10-CM

## 2024-09-09 DIAGNOSIS — J45909 Unspecified asthma, uncomplicated: Secondary | ICD-10-CM | POA: Diagnosis present

## 2024-09-09 DIAGNOSIS — U071 COVID-19: Secondary | ICD-10-CM | POA: Diagnosis present

## 2024-09-09 DIAGNOSIS — J9601 Acute respiratory failure with hypoxia: Secondary | ICD-10-CM | POA: Diagnosis present

## 2024-09-09 DIAGNOSIS — E1169 Type 2 diabetes mellitus with other specified complication: Secondary | ICD-10-CM | POA: Diagnosis present

## 2024-09-09 DIAGNOSIS — Z6835 Body mass index (BMI) 35.0-35.9, adult: Secondary | ICD-10-CM | POA: Diagnosis not present

## 2024-09-09 DIAGNOSIS — I132 Hypertensive heart and chronic kidney disease with heart failure and with stage 5 chronic kidney disease, or end stage renal disease: Secondary | ICD-10-CM | POA: Diagnosis present

## 2024-09-09 DIAGNOSIS — D631 Anemia in chronic kidney disease: Secondary | ICD-10-CM | POA: Diagnosis present

## 2024-09-09 DIAGNOSIS — N179 Acute kidney failure, unspecified: Secondary | ICD-10-CM

## 2024-09-09 DIAGNOSIS — J188 Other pneumonia, unspecified organism: Secondary | ICD-10-CM

## 2024-09-09 DIAGNOSIS — Z8249 Family history of ischemic heart disease and other diseases of the circulatory system: Secondary | ICD-10-CM | POA: Diagnosis not present

## 2024-09-09 DIAGNOSIS — Z886 Allergy status to analgesic agent status: Secondary | ICD-10-CM | POA: Diagnosis not present

## 2024-09-09 DIAGNOSIS — N2581 Secondary hyperparathyroidism of renal origin: Secondary | ICD-10-CM | POA: Diagnosis present

## 2024-09-09 DIAGNOSIS — E7849 Other hyperlipidemia: Secondary | ICD-10-CM | POA: Diagnosis present

## 2024-09-09 DIAGNOSIS — N185 Chronic kidney disease, stage 5: Secondary | ICD-10-CM | POA: Diagnosis present

## 2024-09-09 DIAGNOSIS — I5033 Acute on chronic diastolic (congestive) heart failure: Secondary | ICD-10-CM | POA: Diagnosis present

## 2024-09-09 DIAGNOSIS — R0609 Other forms of dyspnea: Secondary | ICD-10-CM | POA: Diagnosis not present

## 2024-09-09 LAB — RESP PANEL BY RT-PCR (RSV, FLU A&B, COVID)  RVPGX2
Influenza A by PCR: NEGATIVE
Influenza B by PCR: NEGATIVE
Resp Syncytial Virus by PCR: NEGATIVE
SARS Coronavirus 2 by RT PCR: POSITIVE — AB

## 2024-09-09 LAB — PROCALCITONIN: Procalcitonin: 0.33 ng/mL

## 2024-09-09 LAB — BASIC METABOLIC PANEL WITH GFR
Anion gap: 17 — ABNORMAL HIGH (ref 5–15)
BUN: 59 mg/dL — ABNORMAL HIGH (ref 6–20)
CO2: 21 mmol/L — ABNORMAL LOW (ref 22–32)
Calcium: 8.2 mg/dL — ABNORMAL LOW (ref 8.9–10.3)
Chloride: 102 mmol/L (ref 98–111)
Creatinine, Ser: 8.44 mg/dL — ABNORMAL HIGH (ref 0.61–1.24)
GFR, Estimated: 7 mL/min — ABNORMAL LOW (ref >60)
Glucose, Bld: 154 mg/dL — ABNORMAL HIGH (ref 70–99)
Potassium: 3.8 mmol/L (ref 3.5–5.1)
Sodium: 139 mmol/L (ref 135–145)

## 2024-09-09 LAB — CBC
HCT: 27.8 % — ABNORMAL LOW (ref 39.0–52.0)
Hemoglobin: 9.3 g/dL — ABNORMAL LOW (ref 13.0–17.0)
MCH: 30 pg (ref 26.0–34.0)
MCHC: 33.5 g/dL (ref 30.0–36.0)
MCV: 89.7 fL (ref 80.0–100.0)
Platelets: 335 K/uL (ref 150–400)
RBC: 3.1 MIL/uL — ABNORMAL LOW (ref 4.22–5.81)
RDW: 13.2 % (ref 11.5–15.5)
WBC: 13 K/uL — ABNORMAL HIGH (ref 4.0–10.5)
nRBC: 0 % (ref 0.0–0.2)

## 2024-09-09 LAB — URINALYSIS, ROUTINE W REFLEX MICROSCOPIC
Bilirubin Urine: NEGATIVE
Glucose, UA: 100 mg/dL — AB
Ketones, ur: NEGATIVE mg/dL
Leukocytes,Ua: NEGATIVE
Nitrite: NEGATIVE
Protein, ur: 100 mg/dL — AB
Specific Gravity, Urine: 1.009 (ref 1.005–1.030)
pH: 6.5 (ref 5.0–8.0)

## 2024-09-09 LAB — STREP PNEUMONIAE URINARY ANTIGEN: Strep Pneumo Urinary Antigen: NEGATIVE

## 2024-09-09 LAB — GLUCOSE, CAPILLARY: Glucose-Capillary: 127 mg/dL — ABNORMAL HIGH (ref 70–99)

## 2024-09-09 LAB — LACTIC ACID, PLASMA: Lactic Acid, Venous: 0.7 mmol/L (ref 0.5–1.9)

## 2024-09-09 LAB — PRO BRAIN NATRIURETIC PEPTIDE: Pro Brain Natriuretic Peptide: 9001 pg/mL — ABNORMAL HIGH (ref <300.0)

## 2024-09-09 MED ORDER — AMLODIPINE BESYLATE 10 MG PO TABS
10.0000 mg | ORAL_TABLET | Freq: Every day | ORAL | Status: DC
  Administered 2024-09-09 – 2024-09-10 (×2): 10 mg via ORAL
  Filled 2024-09-09: qty 1
  Filled 2024-09-09: qty 2

## 2024-09-09 MED ORDER — ACETAMINOPHEN 650 MG RE SUPP: 650.0000 mg | Freq: Four times a day (QID) | RECTAL | Status: DC | PRN

## 2024-09-09 MED ORDER — NITROGLYCERIN 2 % TD OINT
1.0000 [in_us] | TOPICAL_OINTMENT | Freq: Once | TRANSDERMAL | Status: AC
  Administered 2024-09-09: 15:00:00 1 [in_us] via TOPICAL
  Filled 2024-09-09: qty 1

## 2024-09-09 MED ORDER — CARVEDILOL 25 MG PO TABS
37.5000 mg | ORAL_TABLET | Freq: Two times a day (BID) | ORAL | Status: DC
  Administered 2024-09-09 – 2024-09-11 (×4): 37.5 mg via ORAL
  Filled 2024-09-09 (×3): qty 1
  Filled 2024-09-09: qty 3

## 2024-09-09 MED ORDER — ONDANSETRON HCL 4 MG/2ML IJ SOLN: 4.0000 mg | Freq: Four times a day (QID) | INTRAMUSCULAR | Status: DC | PRN

## 2024-09-09 MED ORDER — DEXAMETHASONE 4 MG PO TABS
6.0000 mg | ORAL_TABLET | Freq: Every day | ORAL | Status: DC
  Administered 2024-09-09 – 2024-09-10 (×2): 6 mg via ORAL
  Filled 2024-09-09 (×2): qty 2

## 2024-09-09 MED ORDER — ROSUVASTATIN CALCIUM 20 MG PO TABS
40.0000 mg | ORAL_TABLET | Freq: Every day | ORAL | Status: DC
  Administered 2024-09-09 – 2024-09-11 (×3): 40 mg via ORAL
  Filled 2024-09-09 (×3): qty 2

## 2024-09-09 MED ORDER — NITROGLYCERIN 2 % TD OINT
1.0000 [in_us] | TOPICAL_OINTMENT | Freq: Four times a day (QID) | TRANSDERMAL | Status: DC
  Administered 2024-09-10 – 2024-09-11 (×5): 1 [in_us] via TOPICAL
  Filled 2024-09-09: qty 30

## 2024-09-09 MED ORDER — CLONIDINE HCL 0.2 MG PO TABS: 0.2000 mg | ORAL_TABLET | ORAL | Status: DC | PRN

## 2024-09-09 MED ORDER — ACETAMINOPHEN 500 MG PO TABS: 1000.0000 mg | ORAL_TABLET | Freq: Four times a day (QID) | ORAL | Status: DC | PRN

## 2024-09-09 MED ORDER — ONDANSETRON HCL 4 MG PO TABS: 4.0000 mg | ORAL_TABLET | Freq: Four times a day (QID) | ORAL | Status: DC | PRN

## 2024-09-09 MED ORDER — IPRATROPIUM-ALBUTEROL 0.5-2.5 (3) MG/3ML IN SOLN: 3.0000 mL | Freq: Four times a day (QID) | RESPIRATORY_TRACT | Status: DC | PRN

## 2024-09-09 MED ORDER — SODIUM CHLORIDE 0.9 % IV SOLN
2.0000 g | INTRAVENOUS | Status: DC
  Administered 2024-09-09: 21:00:00 2 g via INTRAVENOUS
  Filled 2024-09-09: qty 20

## 2024-09-09 MED ORDER — NITROGLYCERIN IN D5W 200-5 MCG/ML-% IV SOLN
0.0000 ug/min | INTRAVENOUS | Status: DC
  Filled 2024-09-09: qty 250

## 2024-09-09 MED ORDER — HYDRALAZINE HCL 50 MG PO TABS
100.0000 mg | ORAL_TABLET | Freq: Three times a day (TID) | ORAL | Status: DC
  Administered 2024-09-09 – 2024-09-11 (×6): 100 mg via ORAL
  Filled 2024-09-09: qty 4
  Filled 2024-09-09 (×5): qty 2

## 2024-09-09 MED ORDER — LABETALOL HCL 5 MG/ML IV SOLN
20.0000 mg | Freq: Once | INTRAVENOUS | Status: AC
  Administered 2024-09-09: 15:00:00 20 mg via INTRAVENOUS
  Filled 2024-09-09: qty 4

## 2024-09-09 MED ORDER — HEPARIN SODIUM (PORCINE) 5000 UNIT/ML IJ SOLN
5000.0000 [IU] | Freq: Three times a day (TID) | INTRAMUSCULAR | Status: DC
  Administered 2024-09-09 – 2024-09-11 (×5): 5000 [IU] via SUBCUTANEOUS
  Filled 2024-09-09 (×5): qty 1

## 2024-09-09 MED ORDER — FUROSEMIDE 10 MG/ML IJ SOLN
40.0000 mg | Freq: Once | INTRAMUSCULAR | Status: AC
  Administered 2024-09-09: 06:00:00 40 mg via INTRAVENOUS
  Filled 2024-09-09: qty 4

## 2024-09-09 MED ORDER — AZITHROMYCIN 500 MG PO TABS
500.0000 mg | ORAL_TABLET | Freq: Every day | ORAL | Status: DC
  Administered 2024-09-09 – 2024-09-11 (×3): 500 mg via ORAL
  Filled 2024-09-09 (×3): qty 1

## 2024-09-09 MED ORDER — NITROGLYCERIN 2 % TD OINT
1.0000 [in_us] | TOPICAL_OINTMENT | Freq: Once | TRANSDERMAL | Status: AC
  Administered 2024-09-09: 06:00:00 1 [in_us] via TOPICAL
  Filled 2024-09-09: qty 1

## 2024-09-09 NOTE — Assessment & Plan Note (Addendum)
 09/09/2024 patient with consistent systolic blood pressures of greater than 200.  Patient is not complaining of any shortness of breath while he is on oxygen.  Will given 20 mg of IV labetalol  here in the ER.  Continue on on nitroglycerin  paste every 6 hours.  If his blood pressure is not controlled with this, may need to escalate to IV nitroglycerin .  Nephrology has been consulted to assist with blood pressure management. Resume his coreg  37.5 mg bid. Hydralazine  100 mg tid.

## 2024-09-09 NOTE — ED Provider Notes (Signed)
 [place] EMERGENCY DEPARTMENT AT [facility] Provider Note   CSN: [id number] Arrival date & time: 09/09/24  0502     Patient presents with: Cough and Shortness of Breath   Luis Wood is a 53 y.o. male.   The history is provided by the patient.  Patient with history of chronic kidney disease, asthma, hypertension, diabetes presents with shortness of breath.  Patient reports over the past 2 days he has had cough, body aches and chills.  Over the past several hours he reports his shortness of breath is worsening.  He reports he woke up short of breath.  No recorded fevers.  No chest pain.  No hemoptysis.  He is a non-smoker.  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   Past Medical History:  Diagnosis Date   Asthma    Diabetes mellitus without complication (HCC)    Hypertension    Hypertensive emergency 09/14/2023    Prior to Admission medications  Medication Sig Start Date End Date Taking? Authorizing Provider  albuterol  (PROVENTIL  HFA;VENTOLIN  HFA) 108 (90 BASE) MCG/ACT inhaler Inhale 2 puffs into the lungs every 4 (four) hours as needed. For wheezing.    [provider]  amLODipine  (NORVASC ) 10 MG tablet TAKE 1 TABLET BY MOUTH EVERY DAY 05/07/24   [name], PA  carvedilol  (COREG ) 12.5 MG tablet Take 3 tablets (37.5 mg total) by mouth 2 (two) times daily with a meal. 01/04/24   Dennise Lavada POUR, MD  Cholecalciferol (VITAMIN D3) 50 MCG (2000 UT) capsule Take 2,000 Units by mouth daily. 06/19/22   [provider]  hydrALAZINE  (APRESOLINE ) 100 MG tablet Take 1 tablet (100 mg total) by mouth every 8 (eight) hours. 01/03/24   [name], PA  isosorbide  mononitrate (IMDUR ) 30 MG 24 hr tablet Take 1 tablet (30 mg total) by mouth daily. 01/05/24   Dennise Lavada POUR, MD  rosuvastatin  (CRESTOR ) 40 MG tablet Take 1 tablet (40 mg total) by mouth daily. 01/03/24    [name], PA  Semaglutide ,0.25 or 0.5MG /DOS, (OZEMPIC , 0.25 OR 0.5 MG/DOSE,) 2 MG/3ML SOPN INJECT 0.25MG  INTO THE SKIN ONE TIME PER WEEK 06/27/24   [name], PA    Allergies: Aspirin    Review of Systems  Constitutional:  Positive for chills.  Respiratory:  Positive for cough and shortness of breath.   Cardiovascular:  Negative for chest pain.  Gastrointestinal:  Negative for vomiting.    Updated Vital Signs BP (!) 208/104   Pulse (!) 114   Temp 98.3 F (36.8 C) (Oral)   Resp (!) 26   SpO2 92%   Physical Exam CONSTITUTIONAL: Well developed/well nourished, distress noted HEAD: Normocephalic/atraumatic EYES: EOMI ENMT: Mucous membranes moist, no stridor or drooling NECK: supple no meningeal signs, no JVD SPINE/BACK:entire spine nontender CV: S1/S2 noted, no murmurs/rubs/gallops noted LUNGS: Crackles bilaterally, tachypnea ABDOMEN: soft, nontender NEURO: Pt is awake/alert/appropriate, moves all extremitiesx4.  No facial droop.   EXTREMITIES: pulses normal/equal, full ROM 1+ symmetric pitting edema bilateral lower extremities SKIN: warm, color normal PSYCH: no abnormalities of mood noted, alert and oriented to situation  (all labs ordered are listed, but only abnormal results are displayed) Labs Reviewed  RESP PANEL BY RT-PCR (RSV, FLU A&B, COVID)  RVPGX2 - Abnormal; Notable for the following components:      Result Value   SARS Coronavirus 2 by RT PCR POSITIVE (*)    All other components within normal limits  BASIC METABOLIC PANEL WITH GFR - Abnormal; Notable for the following components:   CO2 21 (*)    Glucose, Bld 154 (*)    BUN 59 (*)    Creatinine, Ser 8.44 (*)    Calcium  8.2 (*)    GFR, Estimated 7 (*)    Anion gap 17 (*)    All other components within normal limits  CBC - Abnormal; Notable for the following components:   WBC 13.0 (*)    RBC 3.10 (*)    Hemoglobin 9.3 (*)    HCT 27.8 (*)    All other components within normal limits  PRO  BRAIN NATRIURETIC PEPTIDE - Abnormal; Notable for the following components:   Pro Brain Natriuretic Peptide 9,001.0 (*)    All other components within normal limits  CULTURE, BLOOD (ROUTINE X 2)  CULTURE, BLOOD (ROUTINE X 2)  LACTIC ACID, PLASMA    EKG: ED ECG REPORT   Date: 09/09/2024 9487  Rate: 111  Rhythm: sinus tachycardia  QRS Axis: normal  Intervals: normal  ST/T Wave abnormalities: nonspecific ST changes  Conduction Disutrbances:LVH noted  Narrative Interpretation:   Old EKG Reviewed: unchanged  I have personally reviewed the EKG tracing and agree with the computerized printout as noted.     Radiology: DG Chest Portable 1 View Result Date: 09/09/2024 EXAM: 1 VIEW(S) XRAY OF THE CHEST 09/09/2024 05:24:08 AM COMPARISON: 01/03/2024 CLINICAL HISTORY: SOB FINDINGS: LUNGS AND PLEURA: Low lung volumes. Diffuse interstitial prominence with bilateral perihilar opacities. Imaging findings are concerning for multifocal pneumonia. No pleural effusion. No pneumothorax. HEART AND MEDIASTINUM: Cardiomegaly. Calcified aorta. BONES AND SOFT TISSUES: No acute osseous abnormality. IMPRESSION: 1. Imaging findings concerning for bilateral multifocal pneumonia. 2. Cardiomegaly. Electronically signed by: Waddell Calk MD 09/09/2024 05:32 AM EST RP Workstation: [id number]     .Critical Care  Performed by: Midge Golas, MD Authorized by: Midge Golas, MD   Critical care provider statement:    Critical care time (minutes):  60   Critical care start time:  09/09/2024 5:45 AM   Critical care end time:  09/09/2024 6:45 AM   Critical care time was exclusive of:  Separately billable procedures and treating other patients   Critical care was necessary to treat or prevent imminent or life-threatening deterioration of the following conditions:  Respiratory failure and cardiac failure   Critical care was time spent personally by me on the following activities:  Obtaining history from patient  or surrogate, examination of patient, evaluation of patient's response to treatment, discussions with primary provider, development of treatment plan with patient or surrogate, discussions with consultants, ordering and performing treatments and interventions, ordering and review of laboratory studies, ordering and review of radiographic studies, pulse oximetry, review of old charts and re-evaluation of patient's condition   I assumed direction of critical care for this patient from another provider in my specialty: no     Care discussed with: admitting provider      Medications Ordered in the ED  furosemide  (LASIX ) injection 40 mg (40 mg Intravenous Given 09/09/24 0608)  nitroGLYCERIN  (NITROGLYN) 2 % ointment 1 inch (1 inch Topical Given 09/09/24 0618)    Clinical Course as of 09/09/24 0652  Tue Sep 09, 2024  0559 WBC(!): 13.0 Leukocytosis [DW]  0559 Hemoglobin(!): 9.3 Anemia [DW]  0559 Creatinine(!): 8.44 Acute kidney injury [DW]  0600 Patient presents in respiratory distress and hypoxia.  He is now improving on nasal cannula Patient reports medication compliance but his blood pressure is significantly elevated.  He has crackles on exam. He reports sick contacts and feeling chills, but he is currently afebrile. His presentation is mixed between infectious versus cardiac failure.  Will start with blood pressure management at this time and diuresis. Lactic acid is pending at this time as well as viral panel Patient may also need antibiotics  [DW]  [phone] Patient reports that he did have a recent COVID exposure.  I suspect he developed COVID symptoms that triggered pulmonary edema  He is overall appearing improved.  Will call for admission [DW]  [phone] Patient overall appears improved on nasal cannula Discussed with Dr. Marcene for admission Will defer antibiotics for now as this was likely triggered by viral illness, lactate is negative and he is afebrile with an elevated BNP which is more  consistent with decompensated heart failure [DW]    Clinical Course User Index [DW] Midge Golas, MD                                 Medical Decision Making Amount and/or Complexity of Data Reviewed Labs: ordered. Decision-making details documented in ED Course. Radiology: ordered.  Risk Prescription drug management. Decision regarding hospitalization.   This patient presents to the ED for concern of shortness of breath, this involves an extensive number of treatment options, and is a complaint that carries with it a high risk of complications and morbidity.  The differential diagnosis includes but is not limited to Acute coronary syndrome, pneumonia, acute pulmonary edema, pneumothorax, acute anemia, pulmonary embolism, influenza, COVID-19   Comorbidities that complicate the patient evaluation: Patients presentation is complicated by their history of hypertension, chronic kidney disease, diabetes  Social Determinants of Health: Patients patient in a detention center  increases the complexity of managing their presentation  Additional history obtained: Records reviewed previous admission documents  Lab Tests: I Ordered, and personally interpreted labs.  The pertinent results include: Acute kidney injury, leukocytosis, positive COVID-19  Imaging Studies ordered: I ordered imaging studies including X-ray chest  I independently visualized and interpreted imaging which showed edema versus infiltrates I agree with the radiologist interpretation  Cardiac Monitoring: The patient was maintained on a cardiac monitor.  I personally viewed and interpreted the cardiac monitor which showed an underlying rhythm of:  sinus rhythm  Medicines ordered and prescription drug management: I ordered medication including nitroglycerin  and Lasix  for CHF  Critical Interventions:   nitroglycerin  and Lasix , oxygen  Consultations Obtained: I requested consultation with the admitting physician  [facility], and discussed  findings as well as pertinent plan - they recommend: Will admit  Reevaluation: After the interventions noted above, I reevaluated the patient and found that they have :improved  Complexity of problems addressed: Patients presentation is most consistent with  acute presentation with potential threat to life or bodily function  Disposition: After consideration of the diagnostic results and the patients response to treatment,  I feel that the patent would benefit from admission  .        Final diagnoses:  Acute respiratory failure with hypoxia [facility])    ED Discharge Orders     None          Midge Golas, MD 09/09/24 [phone]

## 2024-09-09 NOTE — Plan of Care (Signed)
  Problem: Education: Goal: Knowledge of risk factors and measures for prevention of condition will improve Outcome: Progressing   Problem: Coping: Goal: Psychosocial and spiritual needs will be supported Outcome: Progressing   Problem: Respiratory: Goal: Will maintain a patent airway Outcome: Progressing   Problem: Education: Goal: Knowledge of General Education information will improve Description: Including pain rating scale, medication(s)/side effects and non-pharmacologic comfort measures Outcome: Progressing   Problem: Clinical Measurements: Goal: Ability to maintain clinical measurements within normal limits will improve Outcome: Progressing

## 2024-09-09 NOTE — Assessment & Plan Note (Addendum)
 09/09/2024 continue with Crestor  40 mg daily.

## 2024-09-09 NOTE — Progress Notes (Signed)
 Hospitalist Transfer Note:    Nursing staff, Please call [facility] Admits & Consults System-Wide number on Amion [phone]) as soon as patient's arrival, so appropriate admitting provider can evaluate the pt.   Transferring facility: [facility] Requesting provider: Dr. Midge (EDP at [facility]) Reason for transfer: admission for further evaluation and management of acute hypoxic respiratory failure due to COVID-19 infection, with chest x-ray also showing evidence of pulmonary edema versus multifocal pneumonia.   53 year old male with history of poorly controlled hypertension, stage V CKD not on hemodialysis,  who presented to [facility] ED complaining of shortness of breath starting earlier this evening and preceded by 2 days of cough, chest congestion.  He notes sick exposure at work in the days leading up to the onset of his cough/congestion.  Denies any known baseline supplemental oxygen requirements.  Not associate any chest pain.  He has stage V CKD, but is never required hemodialysis.  Continues to produce urine.  Vital signs in the ED were notable for the following: Initial oxygen saturation was 79% on room air, subsequently improving into the mid 90s on 3 L nasal cannula.  Labs were notable for positive COVID-19 PCR, elevated BNP.  Interval increase in creatinine.  Potassium 3.8.  Imaging notable for chest x-ray, which was read as pulmonary edema versus multifocal pneumonia.  Medications administered prior to transfer included the following: Nitropaste, IV Lasix .  Has been urinating in response to IV Lasix .   Subsequently, I accepted this patient for transfer for inpatient admission to a pcu bed at [facility] for further work-up and management of the above.      Eva Pore, DO Hospitalist

## 2024-09-09 NOTE — H&P (Addendum)
 History and Physical    Luis Wood FMW:[id number] DOB: 1971/02/07 DOA: 09/09/2024  DOS: the patient was seen and examined on 09/09/2024  PCP: [name], PA   Patient coming from: Home  I have personally briefly reviewed patient's old medical records in  Link  CC: fever, chills, SOB HPI: 53 year old African-American male with a history of CKD stage V baseline creatinine about 6, hypertension, type 2 diabetes, hyperlipidemia who presents to the ER with 1 to 2 days of shortness of breath, fever, chills.  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  He was at work yesterday.  He works from 11 AM to 11 PM.  Around 3 PM he noticed himself feeling very chilled and cold.  However he was sweating.  He got home and had his cousin bring him to the ER.  Patient states that he forgot to take his hydralazine  yesterday.  He has been out of his Coreg  for the last 4 days.  He has been taking his Norvasc .  He had a recent admission in April 2025 due to malignant hypertension due to noncompliance.  He has not followed up with his outpatient nephrologist for at least 2 or 3 years.  On arrival to the ER, temp 90.3 heart rate 119 blood pressure 189/128 satting 78% on room air.  He was placed on 5 L of oxygen.  Blood pressure increased to 208/113.  White count 13.0, hemoglobin 9.3, platelets of 335  proBNP elevated 9001  Sodium 139, Tessman 3.8, chloride 102, bicarb 21, BUN of 59, creatinine 8.44, glucose 154, calcium  8.2  COVID was positive, influenza negative, RSV negative  Lactic acid normal at 0.7  Blood cultures x 2 were obtained  Chest x-ray showed diffuse bilateral interstitial prominences with perihilar infiltrates concerning for multifocal pneumonia.  [facility] hospital[facility] contacted for admission.  Significant Events: Admitted 09/09/2024 for multifocal pneumonia, ARF on CKD stage 5, acute respiratory failure with hypoxia   Admission  Labs: White count 13.0, hemoglobin 9.3, platelets of 335 proBNP elevated 9001 Sodium 139, Tessman 3.8, chloride 102, bicarb 21, BUN of 59, creatinine 8.44, glucose 154, calcium  8.2 COVID was positive, influenza negative, RSV negative Lactic acid normal at 0.7 Blood cultures x 2 were obtained  Admission Imaging Studies: Chest x-ray showed diffuse bilateral interstitial prominences with perihilar infiltrates concerning for multifocal pneumonia.  Significant Labs:   Significant Imaging Studies:   Antibiotic Therapy: Anti-infectives (From admission, onward)    None       Procedures:   Consultants: Nephrology - Dennise    ED Course: pt started on oxygen. Given IV lasix . Started on IV ABX.  Review of Systems:  Review of Systems  Constitutional:  Positive for chills, fever and malaise/fatigue.  HENT: Negative.    Eyes: Negative.   Respiratory:  Positive for cough and shortness of breath.   Cardiovascular: Negative.  Negative for chest pain and palpitations.  Gastrointestinal: Negative.  Negative for abdominal pain, nausea and vomiting.  Genitourinary: Negative.   Musculoskeletal:  Positive for myalgias.  Skin: Negative.   Neurological: Negative.   Endo/Heme/Allergies: Negative.   Psychiatric/Behavioral: Negative.    All other systems reviewed and are negative.   Past Medical History:  Diagnosis Date   Asthma    Diabetes mellitus without complication (HCC)    Hypertension    Hypertensive emergency 09/14/2023    History reviewed. No pertinent surgical history.   reports that he has never smoked. He has never used smokeless tobacco. He  reports that he does not drink alcohol and does not use drugs.  Allergies[1]  Family History  Problem Relation Age of Onset   Other Mother        uknown   Hypertension Father    Other Father        rhabdolmylosis   Diabetes Father    Cancer Maternal Grandmother    Cancer Paternal Grandmother    Colon cancer Neg Hx     Colon polyps Neg Hx    Esophageal cancer Neg Hx    Rectal cancer Neg Hx    Stomach cancer Neg Hx     Prior to Admission medications  Medication Sig Start Date End Date Taking? Authorizing Provider  albuterol  (PROVENTIL  HFA;VENTOLIN  HFA) 108 (90 BASE) MCG/ACT inhaler Inhale 2 puffs into the lungs every 4 (four) hours as needed. For wheezing.    [provider]  amLODipine  (NORVASC ) 10 MG tablet TAKE 1 TABLET BY MOUTH EVERY DAY 05/07/24   [name], PA  carvedilol  (COREG ) 12.5 MG tablet Take 3 tablets (37.5 mg total) by mouth 2 (two) times daily with a meal. 01/04/24   Dennise Lavada POUR, MD  Cholecalciferol (VITAMIN D3) 50 MCG (2000 UT) capsule Take 2,000 Units by mouth daily. 06/19/22   [provider]  rosuvastatin  (CRESTOR ) 40 MG tablet Take 1 tablet (40 mg total) by mouth daily. 01/03/24   [name], PA  Semaglutide ,0.25 or 0.5MG /DOS, (OZEMPIC , 0.25 OR 0.5 MG/DOSE,) 2 MG/3ML SOPN INJECT 0.25MG  INTO THE SKIN ONE TIME PER WEEK 06/27/24   [name], [place]    Physical Exam: Vitals:   09/09/24 0848 09/09/24 0859 09/09/24 1201 09/09/24 1244  BP:  (!) 215/102 (!) 180/102   Pulse:  (!) 109 97   Resp:  17 18   Temp:  98.3 F (36.8 C)  98.5 F (36.9 C)  TempSrc:  Oral  Oral  SpO2:  95% 98%   Weight: 102 kg     Height: 5' 7 (1.702 m)       Physical Exam Vitals and nursing note reviewed.  Constitutional:      General: He is not in acute distress.    Appearance: He is obese. He is not toxic-appearing.  HENT:     Head: Normocephalic and atraumatic.     Nose: Nose normal.  Cardiovascular:     Rate and Rhythm: Regular rhythm. Tachycardia present.  Pulmonary:     Breath sounds: Examination of the right-lower field reveals rales. Examination of the left-lower field reveals rales. Rales present.     Comments: Diminished BS bilaterally  Rales bibasilar  Abdominal:     General: Abdomen is protuberant. Bowel sounds are normal. There is no distension.      Palpations: Abdomen is soft.     Tenderness: There is no abdominal tenderness.  Musculoskeletal:     Right lower leg: No edema.     Left lower leg: No edema.  Skin:    General: Skin is warm and dry.     Capillary Refill: Capillary refill takes less than 2 seconds.  Neurological:     General: No focal deficit present.     Mental Status: He is alert and oriented to person, place, and time.      Labs on Admission: I have personally reviewed following labs and imaging studies  CBC: Recent Labs  Lab 09/09/24 0516  WBC 13.0*  HGB 9.3*  HCT 27.8*  MCV 89.7  PLT 335   Basic Metabolic Panel: Recent Labs  Lab 09/09/24 0516  NA 139  K 3.8  CL 102  CO2 21*  GLUCOSE 154*  BUN 59*  CREATININE 8.44*  CALCIUM  8.2*   GFR: Estimated Creatinine Clearance: 11.8 mL/min (A) (by C-G formula based on SCr of 8.44 mg/dL (H)).  ProBNP, BNP (last 5 results) Recent Labs    09/09/24 0516  PROBNP 9,001.0*   Radiological Exams on Admission: I have personally reviewed images DG Chest Portable 1 View Result Date: 09/09/2024 EXAM: 1 VIEW(S) XRAY OF THE CHEST 09/09/2024 05:24:08 AM COMPARISON: 01/03/2024 CLINICAL HISTORY: SOB FINDINGS: LUNGS AND PLEURA: Low lung volumes. Diffuse interstitial prominence with bilateral perihilar opacities. Imaging findings are concerning for multifocal pneumonia. No pleural effusion. No pneumothorax. HEART AND MEDIASTINUM: Cardiomegaly. Calcified aorta. BONES AND SOFT TISSUES: No acute osseous abnormality. IMPRESSION: 1. Imaging findings concerning for bilateral multifocal pneumonia. 2. Cardiomegaly. Electronically signed by: Waddell Calk MD 09/09/2024 05:32 AM EST RP Workstation: [id number]    EKG: My personal interpretation of EKG shows: sinus tachycardia      Assessment/Plan Principal Problem:   Acute hypoxic respiratory failure (HCC) Active Problems:   Acute renal failure superimposed on stage 5 chronic kidney disease, not on chronic dialysis (HCC)    COVID-19 virus infection   Multifocal pneumonia   Malignant hypertension (arteriolar nephrosclerosis), stage 5 chronic kidney disease or end stage renal disease (HCC)   Type 2 diabetes mellitus with diabetic chronic kidney disease (HCC)   Hyperlipidemia associated with type 2 diabetes mellitus (HCC)   Anemia due to chronic kidney disease   Obesity, Class II, BMI 35-39.9    Assessment and Plan: * Acute hypoxic respiratory failure (HCC) 09/09/2024 likely multifactorial etiology.  Due to COVID-19 infection, multifocal pneumonia, pulmonary edema from malignant hypertension and acute renal failure on CKD stage V.  Continue with supplemental oxygen.  Patient does not smoke.  He is not on home oxygen.  Weaned to room air if tolerated.   Acute renal failure superimposed on stage 5 chronic kidney disease, not on chronic dialysis (HCC) 09/09/2024 baseline serum creatinine approximately 6.0.  Last labs were from April 2025.  Patient has not followed up with his outpatient nephrologist with [facility] in at least 1 or 2 years.  Will consult nephrology's.  Message with Dr. Dennise with nephrology.Check urinalysis.  Malignant hypertension (arteriolar nephrosclerosis), stage 5 chronic kidney disease or end stage renal disease (HCC) 09/09/2024 patient with consistent systolic blood pressures of greater than 200.  Patient is not complaining of any shortness of breath while he is on oxygen.  Will given 20 mg of IV labetalol  here in the ER.  Continue on on nitroglycerin  paste every 6 hours.  If his blood pressure is not controlled with this, may need to escalate to IV nitroglycerin .  Nephrology has been consulted to assist with blood pressure management. Resume his coreg  37.5 mg bid. Hydralazine  100 mg tid.   Multifocal pneumonia 09/09/2024 patient has multifocal pneumonia with respiratory failure and hypoxia.  Certainly could be COVID but given the appearance of his multifocal infiltrates on chest x-ray, we  will cover him empirically with IV Rocephin  and p.o. Zithromax .  Check procalcitonin.   COVID-19 virus infection 09/09/2024 start Decadron  6 mg p.o. daily.  As needed DuoNebs.   Obesity, Class II, BMI 35-39.9 09/09/2024 Body mass index is 35.22 kg/m.    Anemia due to chronic kidney disease 09/09/2024 check iron  studies.   Hyperlipidemia associated with type 2 diabetes mellitus (HCC) 09/09/2024 continue with Crestor  40 mg daily.  Type 2 diabetes mellitus with diabetic chronic kidney disease (HCC) 09/09/2024 he is only on Ozempic  as an outpatient.  Placed him on sensitive sliding scale insulin .  May need to increase the dosage of insulin  while he is on Decadron .    DVT prophylaxis: SQ Heparin  Code Status: Full Code Family Communication: no family at bedside. Pt is decisional  Disposition Plan: home  Consults called: nephrology - Singh  Admission status: Inpatient, progressive bed.   Camellia Door, DO [facility] 09/09/2024, 3:01 PM       [1]  Allergies Allergen Reactions   Aspirin Other (See Comments)    Dizziness and fainting, this was a childhood allergy

## 2024-09-09 NOTE — ED Triage Notes (Signed)
 Pt has cough, body aches, congestion and chills x 2 days. Pt has been exposed to flu.

## 2024-09-09 NOTE — ED Notes (Signed)
 Pt O2 sats after placed on 5L of O2 is 91%. Pt taken off oxygen while transported from triage to room and sats decreased to 78% on room air in less than 1 minute. Dr Midge and Izetta, RT at bedside. O2 replaced at 5L via [place]. Pt is also hypertensive

## 2024-09-09 NOTE — Assessment & Plan Note (Addendum)
 09/09/2024 Body mass index is 35.22 kg/m.

## 2024-09-09 NOTE — Hospital Course (Signed)
 CC: fever, chills, SOB HPI: 53 year old African-American male with a history of CKD stage V baseline creatinine about 6, hypertension, type 2 diabetes, hyperlipidemia who presents to the ER with 1 to 2 days of shortness of breath, fever, chills.  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  He was at work yesterday.  He works from 11 AM to 11 PM.  Around 3 PM he noticed himself feeling very chilled and cold.  However he was sweating.  He got home and had his cousin bring him to the ER.  Patient states that he forgot to take his hydralazine  yesterday.  He has been out of his Coreg  for the last 4 days.  He has been taking his Norvasc .  He had a recent admission in April 2025 due to malignant hypertension due to noncompliance.  He has not followed up with his outpatient nephrologist for at least 2 or 3 years.  On arrival to the ER, temp 90.3 heart rate 119 blood pressure 189/128 satting 78% on room air.  He was placed on 5 L of oxygen.  Blood pressure increased to 208/113.  White count 13.0, hemoglobin 9.3, platelets of 335  proBNP elevated 9001  Sodium 139, Tessman 3.8, chloride 102, bicarb 21, BUN of 59, creatinine 8.44, glucose 154, calcium  8.2  COVID was positive, influenza negative, RSV negative  Lactic acid normal at 0.7  Blood cultures x 2 were obtained  Chest x-ray showed diffuse bilateral interstitial prominences with perihilar infiltrates concerning for multifocal pneumonia.  [facility] hospital[facility] contacted for admission.  Significant Events: Admitted 09/09/2024 for multifocal pneumonia, ARF on CKD stage 5, acute respiratory failure with hypoxia   Admission Labs: White count 13.0, hemoglobin 9.3, platelets of 335 proBNP elevated 9001 Sodium 139, Tessman 3.8, chloride 102, bicarb 21, BUN of 59, creatinine 8.44, glucose 154, calcium  8.2 COVID was positive, influenza negative, RSV negative Lactic acid normal at 0.7 Blood cultures x 2 were  obtained  Admission Imaging Studies: Chest x-ray showed diffuse bilateral interstitial prominences with perihilar infiltrates concerning for multifocal pneumonia.  Significant Labs:   Significant Imaging Studies:   Antibiotic Therapy: Anti-infectives (From admission, onward)    None       Procedures:   Consultants: Nephrology - Dennise

## 2024-09-09 NOTE — Assessment & Plan Note (Addendum)
 09/09/2024 baseline serum creatinine approximately 6.0.  Last labs were from April 2025.  Patient has not followed up with his outpatient nephrologist with [facility] in at least 1 or 2 years.  Will consult nephrology's.  Message with Dr. Dennise with nephrology.Check urinalysis.

## 2024-09-09 NOTE — ED Notes (Signed)
 Called Kim at [facility] for transport 18:07-TC

## 2024-09-09 NOTE — Assessment & Plan Note (Addendum)
 09/09/2024 likely multifactorial etiology.  Due to COVID-19 infection, multifocal pneumonia, pulmonary edema from malignant hypertension and acute renal failure on CKD stage V.  Continue with supplemental oxygen.  Patient does not smoke.  He is not on home oxygen.  Weaned to room air if tolerated.

## 2024-09-09 NOTE — Assessment & Plan Note (Addendum)
 09/09/2024 start Decadron  6 mg p.o. daily.  As needed DuoNebs.

## 2024-09-09 NOTE — Progress Notes (Signed)
° °  Patient was seen on arrival to [facility]. Notes and orders were reviewed.   He is now on room air with saturations in upper 90s, normal RR, and speaking in full sentences. He does not have any new complaints. Will add airborne and contact precautions but otherwise continue current plan.

## 2024-09-09 NOTE — Assessment & Plan Note (Addendum)
 09/09/2024 patient has multifocal pneumonia with respiratory failure and hypoxia.  Certainly could be COVID but given the appearance of his multifocal infiltrates on chest x-ray, we will cover him empirically with IV Rocephin  and p.o. Zithromax .  Check procalcitonin.

## 2024-09-09 NOTE — Assessment & Plan Note (Addendum)
 09/09/2024 check iron  studies.

## 2024-09-09 NOTE — Progress Notes (Addendum)
 Brief Nephrology Note  Informed of patient at [facility] ER. Presents with AHRF secondary to COVID/multifocal PNA and possible pulm edema. Being admitted for this along with AKI on CKD 5 (likely progressive CKD) and malignant HTN. Seems that he has not followed up with Dr. Dolan is quite some time. Patient will be seen upon arrival to [facility], likely tomorrow.  Please call on-call nephrology with any questions/concerns in the meantime.  Ephriam Stank, MD [facility]

## 2024-09-09 NOTE — Assessment & Plan Note (Addendum)
 09/09/2024 he is only on Ozempic  as an outpatient.  Placed him on sensitive sliding scale insulin .  May need to increase the dosage of insulin  while he is on Decadron .

## 2024-09-10 ENCOUNTER — Inpatient Hospital Stay (HOSPITAL_COMMUNITY)

## 2024-09-10 DIAGNOSIS — U071 COVID-19: Secondary | ICD-10-CM | POA: Diagnosis not present

## 2024-09-10 DIAGNOSIS — E1122 Type 2 diabetes mellitus with diabetic chronic kidney disease: Secondary | ICD-10-CM | POA: Diagnosis not present

## 2024-09-10 DIAGNOSIS — R0609 Other forms of dyspnea: Secondary | ICD-10-CM | POA: Diagnosis not present

## 2024-09-10 DIAGNOSIS — I120 Hypertensive chronic kidney disease with stage 5 chronic kidney disease or end stage renal disease: Secondary | ICD-10-CM | POA: Diagnosis not present

## 2024-09-10 DIAGNOSIS — N179 Acute kidney failure, unspecified: Secondary | ICD-10-CM | POA: Diagnosis not present

## 2024-09-10 DIAGNOSIS — J9601 Acute respiratory failure with hypoxia: Secondary | ICD-10-CM | POA: Diagnosis not present

## 2024-09-10 DIAGNOSIS — N185 Chronic kidney disease, stage 5: Secondary | ICD-10-CM | POA: Diagnosis not present

## 2024-09-10 LAB — COMPREHENSIVE METABOLIC PANEL WITH GFR
ALT: 11 U/L (ref 0–44)
AST: 20 U/L (ref 15–41)
Albumin: 3.4 g/dL — ABNORMAL LOW (ref 3.5–5.0)
Alkaline Phosphatase: 76 U/L (ref 38–126)
Anion gap: 13 (ref 5–15)
BUN: 68 mg/dL — ABNORMAL HIGH (ref 6–20)
CO2: 18 mmol/L — ABNORMAL LOW (ref 22–32)
Calcium: 7.8 mg/dL — ABNORMAL LOW (ref 8.9–10.3)
Chloride: 106 mmol/L (ref 98–111)
Creatinine, Ser: 8.8 mg/dL — ABNORMAL HIGH (ref 0.61–1.24)
GFR, Estimated: 7 mL/min — ABNORMAL LOW (ref >60)
Glucose, Bld: 160 mg/dL — ABNORMAL HIGH (ref 70–99)
Potassium: 4.7 mmol/L (ref 3.5–5.1)
Sodium: 137 mmol/L (ref 135–145)
Total Bilirubin: <0.2 mg/dL (ref 0.0–1.2)
Total Protein: 6.2 g/dL — ABNORMAL LOW (ref 6.5–8.1)

## 2024-09-10 LAB — BLOOD CULTURE ID PANEL (REFLEXED) - BCID2

## 2024-09-10 LAB — CBC WITH DIFFERENTIAL/PLATELET
Abs Immature Granulocytes: 0.03 K/uL (ref 0.00–0.07)
Basophils Absolute: 0 K/uL (ref 0.0–0.1)
Basophils Relative: 0 %
Eosinophils Absolute: 0 K/uL (ref 0.0–0.5)
Eosinophils Relative: 0 %
HCT: 26.6 % — ABNORMAL LOW (ref 39.0–52.0)
Hemoglobin: 8.8 g/dL — ABNORMAL LOW (ref 13.0–17.0)
Immature Granulocytes: 0 %
Lymphocytes Relative: 5 %
Lymphs Abs: 0.4 K/uL — ABNORMAL LOW (ref 0.7–4.0)
MCH: 29.8 pg (ref 26.0–34.0)
MCHC: 33.1 g/dL (ref 30.0–36.0)
MCV: 90.2 fL (ref 80.0–100.0)
Monocytes Absolute: 0.2 K/uL (ref 0.1–1.0)
Monocytes Relative: 2 %
Neutro Abs: 7.8 K/uL — ABNORMAL HIGH (ref 1.7–7.7)
Neutrophils Relative %: 93 %
Platelets: 311 K/uL (ref 150–400)
RBC: 2.95 MIL/uL — ABNORMAL LOW (ref 4.22–5.81)
RDW: 13.2 % (ref 11.5–15.5)
WBC: 8.5 K/uL (ref 4.0–10.5)
nRBC: 0 % (ref 0.0–0.2)

## 2024-09-10 LAB — ECHOCARDIOGRAM COMPLETE
AR max vel: 2.1 cm2
AV Area VTI: 2.34 cm2
AV Area mean vel: 2.16 cm2
AV Mean grad: 5 mmHg
AV Peak grad: 9.4 mmHg
Ao pk vel: 1.53 m/s
Area-P 1/2: 5.38 cm2
Height: 67 in
S' Lateral: 3.3 cm
Weight: 3597.91 [oz_av]

## 2024-09-10 LAB — GLUCOSE, CAPILLARY
Glucose-Capillary: 218 mg/dL — ABNORMAL HIGH (ref 70–99)
Glucose-Capillary: 265 mg/dL — ABNORMAL HIGH (ref 70–99)
Glucose-Capillary: 214 mg/dL — ABNORMAL HIGH (ref 70–99)

## 2024-09-10 LAB — RENAL FUNCTION PANEL
Albumin: 3.3 g/dL — ABNORMAL LOW (ref 3.5–5.0)
Anion gap: 13 (ref 5–15)
BUN: 70 mg/dL — ABNORMAL HIGH (ref 6–20)
CO2: 18 mmol/L — ABNORMAL LOW (ref 22–32)
Calcium: 7.7 mg/dL — ABNORMAL LOW (ref 8.9–10.3)
Chloride: 105 mmol/L (ref 98–111)
Creatinine, Ser: 8.98 mg/dL — ABNORMAL HIGH (ref 0.61–1.24)
GFR, Estimated: 7 mL/min — ABNORMAL LOW (ref >60)
Glucose, Bld: 230 mg/dL — ABNORMAL HIGH (ref 70–99)
Phosphorus: 4.8 mg/dL — ABNORMAL HIGH (ref 2.5–4.6)
Potassium: 4.6 mmol/L (ref 3.5–5.1)
Sodium: 137 mmol/L (ref 135–145)

## 2024-09-10 LAB — C-REACTIVE PROTEIN: CRP: <3 mg/dL — ABNORMAL HIGH (ref <1.0)

## 2024-09-10 LAB — HEMOGLOBIN A1C
Hgb A1c MFr Bld: 5.1 % (ref 4.8–5.6)
Mean Plasma Glucose: 99.67 mg/dL

## 2024-09-10 LAB — IRON AND TIBC
Iron: 38 ug/dL — ABNORMAL LOW (ref 45–182)
Saturation Ratios: 13 % — ABNORMAL LOW (ref 17.9–39.5)
TIBC: 302 ug/dL (ref 250–450)
UIBC: 265 ug/dL

## 2024-09-10 LAB — PHOSPHORUS: Phosphorus: 4.8 mg/dL — ABNORMAL HIGH (ref 2.5–4.6)

## 2024-09-10 LAB — FERRITIN: Ferritin: 125 ng/mL (ref 24–336)

## 2024-09-10 MED ORDER — INSULIN ASPART 100 UNIT/ML IJ SOLN
0.0000 [IU] | Freq: Three times a day (TID) | INTRAMUSCULAR | Status: DC
  Administered 2024-09-10: 17:00:00 3 [IU] via SUBCUTANEOUS
  Administered 2024-09-10 – 2024-09-11 (×2): 2 [IU] via SUBCUTANEOUS
  Filled 2024-09-10 (×3): qty 2

## 2024-09-10 MED ORDER — FUROSEMIDE 10 MG/ML IJ SOLN
60.0000 mg | Freq: Two times a day (BID) | INTRAMUSCULAR | Status: DC
  Administered 2024-09-10: 17:00:00 60 mg via INTRAVENOUS
  Filled 2024-09-10: qty 6

## 2024-09-10 MED ORDER — IRON SUCROSE 200 MG IVPB - SIMPLE MED
200.0000 mg | Freq: Every day | Status: DC
  Administered 2024-09-10: 13:00:00 200 mg via INTRAVENOUS
  Filled 2024-09-10: qty 110
  Filled 2024-09-10: qty 200

## 2024-09-10 NOTE — Consult Note (Signed)
 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 Has followed up with Dr. Dolan since 2023 -not exhibiting any uremic symptoms thankfully however he is close to needing to start renal replacement therapy, no indications currently. We discussed this in great detail today. Given that he is young and still works, would recommend home therapies/PD for him. He is going to think about this. He will need very close follow up at our office to get him set up for home therapies eval -Avoid nephrotoxic medications including NSAIDs and iodinated intravenous contrast exposure unless the latter is absolutely indicated.  Preferred narcotic agents for pain control are hydromorphone , fentanyl, and methadone. Morphine should not be used. Avoid Baclofen and avoid oral sodium phosphate  and magnesium citrate based laxatives / bowel preps. Continue strict Input and Output monitoring. Will monitor the patient closely with you and intervene or adjust therapy as indicated by changes in clinical status/labs   Malignant HTN -in the context of nonadherence -BP improving -diuretics as below  AHRF, improving -in the context of multifocal pna and pulm edema -on rocephin  and zithromax  per primary service -improved -diuretics: starting lasix  60mg  IV BID fo rnow  Anemia of CKD -iron  deplete, will start iron  load -transfuse prn for hgb <7  Secondary hyperparathyroidism -no BMD labs since 2023, will repeat while he's here  DM2 -per primary service  Recommendations conveyed to primary service.   Ephriam Stank [facility] 09/10/2024 10:26 AM _____________________________________________________________________________________  History of Present Illness: Luis Wood is a/an 53 y.o. male with a past medical history of CKD5, HTN, DM2, obesity, HLD who presents to [facility] with fever,chills, SOB.  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 Had not taken his hydralazine  and had run out of his coreg . Found to have a Cr up to 8.4, last Cr was 6.5 in April when he was admitted for malignant HTN at that time. He reports feeling much better, SOB resolved. Denies any chest pain, N/V, loss of appetite, dysgeusia, new shakes/tremors, brain fog. He reports that he has not been back to the office since 2023 given work responsibilities.   Medications:  Current Facility-Administered Medications  Medication Dose Route Frequency Provider Last Rate Last Admin   acetaminophen  (TYLENOL ) tablet 1,000 mg  1,000 mg Oral Q6H PRN Laurence Locus, DO       Or   acetaminophen  (TYLENOL ) suppository 650 mg  650 mg Rectal Q6H PRN Laurence Locus, DO       amLODipine  (NORVASC ) tablet 10 mg  10 mg Oral Daily Laurence Locus, DO   10 mg at 09/10/24 0900   azithromycin  (ZITHROMAX ) tablet 500 mg  500 mg Oral Daily Laurence Locus, DO   500 mg at 09/10/24 0900   carvedilol  (COREG ) tablet 37.5 mg  37.5 mg Oral BID WC Laurence Locus, DO   37.5 mg at 09/10/24 9140   cefTRIAXone  (ROCEPHIN ) 2 g in sodium chloride  0.9 % 100 mL IVPB  2 g Intravenous Q24H Laurence Locus, DO   Stopped at 09/09/24 2146   cloNIDine  (CATAPRES ) tablet 0.2 mg  0.2 mg Oral Q4H PRN Laurence Locus, DO       dexamethasone  (DECADRON ) tablet 6 mg  6 mg Oral Daily Laurence Locus, DO   6 mg at 09/10/24 0859   heparin  injection 5,000 Units  5,000 Units Subcutaneous Q8H Laurence Locus, DO   5,000 Units at 09/10/24 9476   hydrALAZINE  (  APRESOLINE ) tablet 100 mg  100 mg Oral TID Laurence Locus, DO   100 mg at 09/10/24 0900   ipratropium-albuterol  (DUONEB) 0.5-2.5 (3) MG/3ML nebulizer solution 3 mL  3 mL Nebulization Q6H PRN Laurence Locus, DO       nitroGLYCERIN  (NITROGLYN) 2 % ointment 1 inch  1 inch Topical Q6H Laurence Locus, DO   1 inch at 09/10/24 0524   ondansetron  (ZOFRAN ) tablet 4 mg  4 mg Oral Q6H PRN Laurence Locus, DO       Or   ondansetron  (ZOFRAN ) injection 4 mg  4 mg  Intravenous Q6H PRN Laurence Locus, DO       rosuvastatin  (CRESTOR ) tablet 40 mg  40 mg Oral Daily Laurence Locus, DO   40 mg at 09/10/24 0900     ALLERGIES Aspirin  MEDICAL HISTORY Past Medical History:  Diagnosis Date   Asthma    Diabetes mellitus without complication (HCC)    Hypertension    Hypertensive emergency 09/14/2023     SOCIAL HISTORY Social History   Socioeconomic History   Marital status: Significant Other    Spouse name: Not on file   Number of children: Not on file   Years of education: Not on file   Highest education level: Some college, no degree  Occupational History   Not on file  Tobacco Use   Smoking status: Never   Smokeless tobacco: Never  Substance and Sexual Activity   Alcohol use: No   Drug use: No   Sexual activity: Not on file  Other Topics Concern   Not on file  Social History Narrative   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[place] -- [employer]   Live with family   Social Drivers of Health   Tobacco Use: Low Risk (09/09/2024)   Patient History    Smoking Tobacco Use: Never    Smokeless Tobacco Use: Never    Passive Exposure: Not on file  Financial Resource Strain: Low Risk (01/03/2024)   Overall Financial Resource Strain (CARDIA)    Difficulty of Paying Living Expenses: Not hard at all  Food Insecurity: No Food Insecurity (01/03/2024)   Hunger Vital Sign    Worried About Running Out of Food in the Last Year: Never true    Ran Out of Food in the Last Year: Never true  Transportation Needs: No Transportation Needs (01/03/2024)   PRAPARE - Administrator, Civil Service (Medical): No    Lack of Transportation (Non-Medical): No  Physical Activity: Insufficiently Active (01/03/2024)   Exercise Vital Sign    Days of Exercise per Week: 3 days    Minutes of Exercise per Session: 30 min  Stress: No Stress Concern Present (01/03/2024)   [employer] of Occupational Health - Occupational Stress Questionnaire    Feeling of Stress : Only a little  Social  Connections: Moderately Integrated (01/03/2024)   Social Connection and Isolation Panel    Frequency of Communication with Friends and Family: More than three times a week    Frequency of Social Gatherings with Friends and Family: Once a week    Attends Religious Services: 1 to 4 times per year    Active Member of [employer] or Organizations: Yes    Attends Banker Meetings: 1 to 4 times per year    Marital Status: Divorced  Intimate Partner Violence: Not At Risk (01/03/2024)   Humiliation, Afraid, Rape, and Kick questionnaire    Fear of Current or Ex-Partner: No    Emotionally Abused: No  Physically Abused: No    Sexually Abused: No  Depression (PHQ2-9): Low Risk (02/07/2023)   Depression (PHQ2-9)    PHQ-2 Score: 4  Alcohol Screen: Not on file  Housing: Low Risk (01/03/2024)   Housing Stability Vital Sign    Unable to Pay for Housing in the Last Year: No    Number of Times Moved in the Last Year: 1    Homeless in the Last Year: No  Utilities: Not At Risk (01/03/2024)   AHC Utilities    Threatened with loss of utilities: No  Health Literacy: Not on file     FAMILY HISTORY Family History  Problem Relation Age of Onset   Other Mother        uknown   Hypertension Father    Other Father        rhabdolmylosis   Diabetes Father    Cancer Maternal Grandmother    Cancer Paternal Grandmother    Colon cancer Neg Hx    Colon polyps Neg Hx    Esophageal cancer Neg Hx    Rectal cancer Neg Hx    Stomach cancer Neg Hx      Review of Systems: 12 systems reviewed Otherwise as per HPI, all other systems reviewed and negative  Physical Exam: Vitals:   09/10/24 0645 09/10/24 0800  BP:  (!) 169/96  Pulse: (!) 101 (!) 107  Resp: 20 (!) 21  Temp:  98.2 F (36.8 C)  SpO2: 92% 96%   No intake/output data recorded.  Intake/Output Summary (Last 24 hours) at 09/10/2024 1026 Last data filed at 09/09/2024 2301 Gross per 24 hour  Intake 100 ml  Output 400 ml  Net -300 ml    General: well-appearing, no acute distress HEENT: anicteric sclera, oropharynx clear without lesions CV: regular rate, normal rhythm, no murmurs, no gallops, no rubs, no peripheral edema Lungs: unlabored, normal work of breathing Abd: soft, non-tender, slightly distended, obese Skin: no visible lesions or rashes Psych: alert, engaged, appropriate mood and affect Musculoskeletal: no obvious deformities Neuro: normal speech, no gross focal deficits, no myoclonic jerks observed  Test Results Reviewed Lab Results  Component Value Date   NA 137 09/10/2024   K 4.7 09/10/2024   CL 106 09/10/2024   CO2 18 (L) 09/10/2024   BUN 68 (H) 09/10/2024   CREATININE 8.80 (H) 09/10/2024   GFR 15.06 (L) 02/07/2023   CALCIUM  7.8 (L) 09/10/2024   ALBUMIN 3.4 (L) 09/10/2024     I have reviewed all relevant outside healthcare records related to the patient's kidney injury.

## 2024-09-10 NOTE — Progress Notes (Signed)
 PROGRESS NOTE        PATIENT DETAILS Name: Luis Wood Age: 53 y.o. Sex: male Date of Birth: 05-20-1971 Admit Date: 09/09/2024 Admitting Physician Eva KATHEE Pore, DO ERE:Tnmozb, Lucie, [place]  Brief Summary: Patient is a 53 y.o.  male with history of CKD 5, HTN, DM-2, HLD-who initially had URI symptoms approximately 2 weeks back-then had progressive worsening shortness of breath-found to have COVID-19 infection and possible CHF on CXR.  Significant events: 12/16>> admit to [facility]  Significant studies: 12/16>> CXR: No PNA 12/17>> renal ultrasound:  Significant microbiology data: 12/16>> COVID PCR: Positive 12/16>> influenza/RSV PCR: Negative 12/16>> blood culture: Gram-positive cocci (staph species)-likely contaminant  Procedures: None  Consults: Nephrology  Subjective: Lying comfortably in bed-denies any chest pain or shortness of breath.  Objective: Vitals: Blood pressure (!) 169/96, pulse (!) 107, temperature 98.2 F (36.8 C), temperature source Oral, resp. rate (!) 21, height 5' 7 (1.702 m), weight 102 kg, SpO2 96%.   Exam: Gen Exam:Alert awake-not in any distress HEENT:atraumatic, normocephalic Chest: B/L clear to auscultation anteriorly CVS:S1S2 regular Abdomen:soft non tender, non distended Extremities:no edema Neurology: Non focal Skin: no rash  Pertinent Labs/Radiology:    Latest Ref Rng & Units 09/10/2024    5:10 AM 09/09/2024    5:16 AM 01/04/2024    6:20 AM  CBC  WBC 4.0 - 10.5 K/uL 8.5  13.0  8.8   Hemoglobin 13.0 - 17.0 g/dL 8.8  9.3  9.0   Hematocrit 39.0 - 52.0 % 26.6  27.8  27.8   Platelets 150 - 400 K/uL 311  335  238     Lab Results  Component Value Date   NA 137 09/10/2024   K 4.7 09/10/2024   CL 106 09/10/2024   CO2 18 (L) 09/10/2024     Assessment/Plan: Acute hypoxic respiratory failure Seems to have rapidly improved-on room air Suspect etiology is pulm edema due to decompensated heart  failure in the setting of worsening renal function given rapid improvement-received Lasix  on admission.  Acute on chronic HFpEF Clinically improved-on room air No significant peripheral edema Diuretics defer to nephrology service  COVID-19 infection CRP<3-given rapid improvement in symptoms-suspect lung findings are mostly related to CHF-doubt PNA at this point ( ruled out) Stop Decadron  Stop IV antibiotics and monitor  CKD 5 Hypoxia better after diuretics-other electrolytes stable Await nephrology input  Normocytic anemia Secondary to CKD Defer iron /Aranesp to nephrology service  HTN Uncontrolled-this is a chronic issue Continue Coreg /amlodipine /hydralazine   HLD Crestor   DM-2 (A1c 5.1 on 12/16) SSI  Recent Labs    09/09/24 1946  GLUCAP 127*    Class 1 Obesity: Estimated body mass index is 35.22 kg/m as calculated from the following:   Height as of this encounter: 5' 7 (1.702 m).   Weight as of this encounter: 102 kg.   Code status:   Code Status: Full Code   DVT Prophylaxis: heparin  injection 5,000 Units Start: 09/09/24 2200 SCDs Start: 09/09/24 2020   Family Communication: None at bedside   Disposition Plan: Status is: Inpatient Remains inpatient appropriate because: Severity of illness   Planned Discharge Destination:Home   Diet: Diet Order             Diet renal/carb modified with fluid restriction Diet-HS Snack? Nothing; Fluid restriction: 1800 mL Fluid; Room service appropriate? Yes; Fluid consistency: Thin  Diet effective now  Antimicrobial agents: Anti-infectives (From admission, onward)    Start     Dose/Rate Route Frequency Ordered Stop   09/09/24 2200  cefTRIAXone  (ROCEPHIN ) 2 g in sodium chloride  0.9 % 100 mL IVPB        2 g 200 mL/hr over 30 Minutes Intravenous Every 24 hours 09/09/24 2019 09/14/24 2159   09/09/24 2200  azithromycin  (ZITHROMAX ) tablet 500 mg        500 mg Oral Daily 09/09/24 2019 09/14/24  0959        MEDICATIONS: Scheduled Meds:  amLODipine   10 mg Oral Daily   azithromycin   500 mg Oral Daily   carvedilol   37.5 mg Oral BID WC   dexamethasone   6 mg Oral Daily   heparin   5,000 Units Subcutaneous Q8H   hydrALAZINE   100 mg Oral TID   nitroGLYCERIN   1 inch Topical Q6H   rosuvastatin   40 mg Oral Daily   Continuous Infusions:  cefTRIAXone  (ROCEPHIN )  IV Stopped (09/09/24 2146)   PRN Meds:.acetaminophen  **OR** acetaminophen , cloNIDine , ipratropium-albuterol , ondansetron  **OR** ondansetron  (ZOFRAN ) IV   I have personally reviewed following labs and imaging studies  LABORATORY DATA: CBC: Recent Labs  Lab 09/09/24 0516 09/10/24 0510  WBC 13.0* 8.5  NEUTROABS  --  7.8*  HGB 9.3* 8.8*  HCT 27.8* 26.6*  MCV 89.7 90.2  PLT 335 311    Basic Metabolic Panel: Recent Labs  Lab 09/09/24 0516 09/10/24 0510  NA 139 137  K 3.8 4.7  CL 102 106  CO2 21* 18*  GLUCOSE 154* 160*  BUN 59* 68*  CREATININE 8.44* 8.80*  CALCIUM  8.2* 7.8*    GFR: Estimated Creatinine Clearance: 11.3 mL/min (A) (by C-G formula based on SCr of 8.8 mg/dL (H)).  Liver Function Tests: Recent Labs  Lab 09/10/24 0510  AST 20  ALT 11  ALKPHOS 76  BILITOT <0.2  PROT 6.2*  ALBUMIN 3.4*   No results for input(s): LIPASE, AMYLASE in the last 168 hours. No results for input(s): AMMONIA in the last 168 hours.  Coagulation Profile: No results for input(s): INR, PROTIME in the last 168 hours.  Cardiac Enzymes: No results for input(s): CKTOTAL, CKMB, CKMBINDEX, TROPONINI in the last 168 hours.  BNP (last 3 results) Recent Labs    09/09/24 0516  PROBNP 9,001.0*    Lipid Profile: No results for input(s): CHOL, HDL, LDLCALC, TRIG, CHOLHDL, LDLDIRECT in the last 72 hours.  Thyroid Function Tests: No results for input(s): TSH, T4TOTAL, FREET4, T3FREE, THYROIDAB in the last 72 hours.  Anemia Panel: Recent Labs    09/09/24 2227  TIBC 302   IRON  38*    Urine analysis:    Component Value Date/Time   COLORURINE COLORLESS (A) 09/09/2024 1650   APPEARANCEUR CLEAR 09/09/2024 1650   LABSPEC 1.009 09/09/2024 1650   PHURINE 6.5 09/09/2024 1650   GLUCOSEU 100 (A) 09/09/2024 1650   HGBUR MODERATE (A) 09/09/2024 1650   BILIRUBINUR NEGATIVE 09/09/2024 1650   KETONESUR NEGATIVE 09/09/2024 1650   PROTEINUR 100 (A) 09/09/2024 1650   UROBILINOGEN 0.2 08/21/2011 1831   NITRITE NEGATIVE 09/09/2024 1650   LEUKOCYTESUR NEGATIVE 09/09/2024 1650    Sepsis Labs: Lactic Acid, Venous    Component Value Date/Time   LATICACIDVEN 0.7 09/09/2024 0533    MICROBIOLOGY: Recent Results (from the past 240 hours)  Resp panel by RT-PCR (RSV, Flu A&B, Covid) Anterior Nasal Swab     Status: Abnormal   Collection Time: 09/09/24  5:16 AM   Specimen: Anterior Nasal Swab  Result Value Ref Range Status   SARS Coronavirus 2 by RT PCR POSITIVE (A) NEGATIVE Final    Comment: (NOTE) SARS-CoV-2 target nucleic acids are DETECTED.  The SARS-CoV-2 RNA is generally detectable in upper respiratory specimens during the acute phase of infection. Positive results are indicative of the presence of the identified virus, but do not rule out bacterial infection or co-infection with other pathogens not detected by the test. Clinical correlation with patient history and other diagnostic information is necessary to determine patient infection status. The expected result is Negative.  Fact Sheet for Patients: bloggercourse.com  Fact Sheet for Healthcare Providers: seriousbroker.it  This test is not yet approved or cleared by the [place]  FDA and  has been authorized for detection and/or diagnosis of SARS-CoV-2 by FDA under an Emergency Use Authorization (EUA).  This EUA will remain in effect (meaning this test can be used) for the duration of  the COVID-19 declaration under Section 564(b)(1) of the A ct,  21 U.S.C. section 360bbb-3(b)(1), unless the authorization is terminated or revoked sooner.     Influenza A by PCR NEGATIVE NEGATIVE Final   Influenza B by PCR NEGATIVE NEGATIVE Final    Comment: (NOTE) The Xpert Xpress SARS-CoV-2/FLU/RSV plus assay is intended as an aid in the diagnosis of influenza from Nasopharyngeal swab specimens and should not be used as a sole basis for treatment. Nasal washings and aspirates are unacceptable for Xpert Xpress SARS-CoV-2/FLU/RSV testing.  Fact Sheet for Patients: bloggercourse.com  Fact Sheet for Healthcare Providers: seriousbroker.it  This test is not yet approved or cleared by the [place]  FDA and has been authorized for detection and/or diagnosis of SARS-CoV-2 by FDA under an Emergency Use Authorization (EUA). This EUA will remain in effect (meaning this test can be used) for the duration of the COVID-19 declaration under Section 564(b)(1) of the Act, 21 U.S.C. section 360bbb-3(b)(1), unless the authorization is terminated or revoked.     Resp Syncytial Virus by PCR NEGATIVE NEGATIVE Final    Comment: (NOTE) Fact Sheet for Patients: bloggercourse.com  Fact Sheet for Healthcare Providers: seriousbroker.it  This test is not yet approved or cleared by the [place]  FDA and has been authorized for detection and/or diagnosis of SARS-CoV-2 by FDA under an Emergency Use Authorization (EUA). This EUA will remain in effect (meaning this test can be used) for the duration of the COVID-19 declaration under Section 564(b)(1) of the Act, 21 U.S.C. section 360bbb-3(b)(1), unless the authorization is terminated or revoked.  Performed at [facility], [address], [place], [place] [address]   Blood culture (routine x 2)     Status: None (Preliminary result)   Collection Time: 09/09/24  5:33 AM   Specimen: BLOOD   Result Value Ref Range Status   Specimen Description   Final    BLOOD LEFT ANTECUBITAL Performed at [facility], [address], [place], [place] [address]    Special Requests   Final    BOTTLES DRAWN AEROBIC AND ANAEROBIC Blood Culture adequate volume Performed at [facility], [address], [place], [place] [address]    Culture  Setup Time   Final    GRAM POSITIVE COCCI IN CLUSTERS AEROBIC BOTTLE ONLY CRITICAL RESULT CALLED TO, READ BACK BY AND VERIFIED WITH: PHARMD G ABBOTT 12/217/2025 @ 0537 BY AB Performed at [facility], [address] [place], [place] [address]    Culture [facility] POSITIVE COCCI  Final   Report Status PENDING  Incomplete  Blood  Culture ID Panel (Reflexed)     Status: Abnormal   Collection Time: 09/09/24  5:33 AM  Result Value Ref Range Status   Enterococcus faecalis NOT DETECTED NOT DETECTED Final   Enterococcus Faecium NOT DETECTED NOT DETECTED Final   Listeria monocytogenes NOT DETECTED NOT DETECTED Final   Staphylococcus species DETECTED (A) NOT DETECTED Final    Comment: CRITICAL RESULT CALLED TO, READ BACK BY AND VERIFIED WITH: PHARMD G ABBOTT 09/10/2024 @ 0537 BY AB    Staphylococcus aureus (BCID) NOT DETECTED NOT DETECTED Final   Staphylococcus epidermidis NOT DETECTED NOT DETECTED Final   Staphylococcus lugdunensis NOT DETECTED NOT DETECTED Final   Streptococcus species NOT DETECTED NOT DETECTED Final   Streptococcus agalactiae NOT DETECTED NOT DETECTED Final   Streptococcus pneumoniae NOT DETECTED NOT DETECTED Final   Streptococcus pyogenes NOT DETECTED NOT DETECTED Final   A.calcoaceticus-baumannii NOT DETECTED NOT DETECTED Final   Bacteroides fragilis NOT DETECTED NOT DETECTED Final   Enterobacterales NOT DETECTED NOT DETECTED Final   Enterobacter cloacae complex NOT DETECTED NOT DETECTED Final   Escherichia coli NOT DETECTED NOT DETECTED Final   Klebsiella aerogenes NOT DETECTED NOT DETECTED  Final   Klebsiella oxytoca NOT DETECTED NOT DETECTED Final   Klebsiella pneumoniae NOT DETECTED NOT DETECTED Final   Proteus species NOT DETECTED NOT DETECTED Final   Salmonella species NOT DETECTED NOT DETECTED Final   Serratia marcescens NOT DETECTED NOT DETECTED Final   Haemophilus influenzae NOT DETECTED NOT DETECTED Final   Neisseria meningitidis NOT DETECTED NOT DETECTED Final   Pseudomonas aeruginosa NOT DETECTED NOT DETECTED Final   Stenotrophomonas maltophilia NOT DETECTED NOT DETECTED Final   Candida albicans NOT DETECTED NOT DETECTED Final   Candida auris NOT DETECTED NOT DETECTED Final   Candida glabrata NOT DETECTED NOT DETECTED Final   Candida krusei NOT DETECTED NOT DETECTED Final   Candida parapsilosis NOT DETECTED NOT DETECTED Final   Candida tropicalis NOT DETECTED NOT DETECTED Final   Cryptococcus neoformans/gattii NOT DETECTED NOT DETECTED Final    Comment: Performed at [facility], [address] [place], [place] [address]  Blood culture (routine x 2)     Status: None (Preliminary result)   Collection Time: 09/09/24  5:43 AM   Specimen: BLOOD  Result Value Ref Range Status   Specimen Description   Final    BLOOD RIGHT ANTECUBITAL Performed at [facility], [address] [place], [place] [address]    Special Requests   Final    NONE Performed at [facility], [address] [place], [place] [address]    Culture   Final    NO GROWTH < 24 HOURS Performed at [facility], [address], [place], [place] [address]    Report Status PENDING  Incomplete    RADIOLOGY STUDIES/RESULTS: US  RENAL Result Date: 09/10/2024 EXAM: US  Retroperitoneum Complete, Renal. 09/10/2024 12:50:00 AM TECHNIQUE: Real-time ultrasonography of the retroperitoneum renal was performed. COMPARISON: None available CLINICAL HISTORY: AKI (acute kidney injury). FINDINGS: FINDINGS: RIGHT KIDNEY/URETER: Right kidney measures 12.2 x 5.5 x 4.5  cm. Calculated volume: 158 ml. Normal cortical echogenicity. No hydronephrosis. No calculus. No mass. LEFT KIDNEY/URETER: Left kidney measures 9.3 x 4.7 x 3.9 cm. Calculated volume: 87 ml. Normal cortical echogenicity. No hydronephrosis. No calculus. No mass. BLADDER: The bladder is decompressed. IMPRESSION: 1. No acute findings. 2. Asymmetrically small left kidney, which may reflect chronic renal parenchymal disease. Electronically signed by: Oneil Devonshire MD 09/10/2024 01:31 AM EST RP Workstation:  [id number]   DG Chest Portable 1 View Result Date: 09/09/2024 EXAM: 1 VIEW(S) XRAY OF THE CHEST 09/09/2024 05:24:08 AM COMPARISON: 01/03/2024 CLINICAL HISTORY: SOB FINDINGS: LUNGS AND PLEURA: Low lung volumes. Diffuse interstitial prominence with bilateral perihilar opacities. Imaging findings are concerning for multifocal pneumonia. No pleural effusion. No pneumothorax. HEART AND MEDIASTINUM: Cardiomegaly. Calcified aorta. BONES AND SOFT TISSUES: No acute osseous abnormality. IMPRESSION: 1. Imaging findings concerning for bilateral multifocal pneumonia. 2. Cardiomegaly. Electronically signed by: Waddell Calk MD 09/09/2024 05:32 AM EST RP Workstation: [id number]     LOS: 1 day   Donalda Applebaum, MD  [facility]    To contact the attending provider between 7A-7P or the covering provider during after hours 7P-7A, please log into the web site [email or web address] and access using universal  password for that web site. If you do not have the password, please call the hospital operator.  09/10/2024, 10:24 AM

## 2024-09-10 NOTE — Progress Notes (Signed)
 PHARMACY - PHYSICIAN COMMUNICATION CRITICAL VALUE ALERT - BLOOD CULTURE IDENTIFICATION (BCID)  Luis Wood is an 53 y.o. male who presented to [facility] on 09/09/2024 with a chief complaint of PNA  Assessment:   1/2 blood cultures growing Staph species, likely contaminant  Name of physician (or Provider) Contacted:  Dr. Marcene  Current antibiotics:  Rocephin  and Azithromycin   Changes to prescribed antibiotics recommended:  No changes needed at this time   Results for orders placed or performed during the hospital encounter of 09/09/24  Blood Culture ID Panel (Reflexed) (Collected: 09/09/2024  5:33 AM)  Result Value Ref Range   Enterococcus faecalis NOT DETECTED NOT DETECTED   Enterococcus Faecium NOT DETECTED NOT DETECTED   Listeria monocytogenes NOT DETECTED NOT DETECTED   Staphylococcus species DETECTED (A) NOT DETECTED   Staphylococcus aureus (BCID) NOT DETECTED NOT DETECTED   Staphylococcus epidermidis NOT DETECTED NOT DETECTED   Staphylococcus lugdunensis NOT DETECTED NOT DETECTED   Streptococcus species NOT DETECTED NOT DETECTED   Streptococcus agalactiae NOT DETECTED NOT DETECTED   Streptococcus pneumoniae NOT DETECTED NOT DETECTED   Streptococcus pyogenes NOT DETECTED NOT DETECTED   A.calcoaceticus-baumannii NOT DETECTED NOT DETECTED   Bacteroides fragilis NOT DETECTED NOT DETECTED   Enterobacterales NOT DETECTED NOT DETECTED   Enterobacter cloacae complex NOT DETECTED NOT DETECTED   Escherichia coli NOT DETECTED NOT DETECTED   Klebsiella aerogenes NOT DETECTED NOT DETECTED   Klebsiella oxytoca NOT DETECTED NOT DETECTED   Klebsiella pneumoniae NOT DETECTED NOT DETECTED   Proteus species NOT DETECTED NOT DETECTED   Salmonella species NOT DETECTED NOT DETECTED   Serratia marcescens NOT DETECTED NOT DETECTED   Haemophilus influenzae NOT DETECTED NOT DETECTED   Neisseria meningitidis NOT DETECTED NOT DETECTED   Pseudomonas aeruginosa NOT DETECTED NOT  DETECTED   Stenotrophomonas maltophilia NOT DETECTED NOT DETECTED   Candida albicans NOT DETECTED NOT DETECTED   Candida auris NOT DETECTED NOT DETECTED   Candida glabrata NOT DETECTED NOT DETECTED   Candida krusei NOT DETECTED NOT DETECTED   Candida parapsilosis NOT DETECTED NOT DETECTED   Candida tropicalis NOT DETECTED NOT DETECTED   Cryptococcus neoformans/gattii NOT DETECTED NOT DETECTED    Dail Cordella Misty 09/10/2024  5:39 AM

## 2024-09-11 ENCOUNTER — Inpatient Hospital Stay (HOSPITAL_COMMUNITY)

## 2024-09-11 ENCOUNTER — Other Ambulatory Visit (HOSPITAL_COMMUNITY): Payer: Self-pay

## 2024-09-11 DIAGNOSIS — I120 Hypertensive chronic kidney disease with stage 5 chronic kidney disease or end stage renal disease: Secondary | ICD-10-CM | POA: Diagnosis not present

## 2024-09-11 DIAGNOSIS — J9601 Acute respiratory failure with hypoxia: Secondary | ICD-10-CM | POA: Diagnosis not present

## 2024-09-11 DIAGNOSIS — E1122 Type 2 diabetes mellitus with diabetic chronic kidney disease: Secondary | ICD-10-CM | POA: Diagnosis not present

## 2024-09-11 DIAGNOSIS — U071 COVID-19: Secondary | ICD-10-CM | POA: Diagnosis not present

## 2024-09-11 LAB — RENAL FUNCTION PANEL
Albumin: 3.3 g/dL — ABNORMAL LOW (ref 3.5–5.0)
Anion gap: 14 (ref 5–15)
BUN: 82 mg/dL — ABNORMAL HIGH (ref 6–20)
CO2: 17 mmol/L — ABNORMAL LOW (ref 22–32)
Calcium: 8 mg/dL — ABNORMAL LOW (ref 8.9–10.3)
Chloride: 105 mmol/L (ref 98–111)
Creatinine, Ser: 9.95 mg/dL — ABNORMAL HIGH (ref 0.61–1.24)
GFR, Estimated: 6 mL/min — ABNORMAL LOW (ref >60)
Glucose, Bld: 160 mg/dL — ABNORMAL HIGH (ref 70–99)
Phosphorus: 5.1 mg/dL — ABNORMAL HIGH (ref 2.5–4.6)
Potassium: 4.8 mmol/L (ref 3.5–5.1)
Sodium: 136 mmol/L (ref 135–145)

## 2024-09-11 LAB — CBC
HCT: 25.6 % — ABNORMAL LOW (ref 39.0–52.0)
Hemoglobin: 8.3 g/dL — ABNORMAL LOW (ref 13.0–17.0)
MCH: 29.4 pg (ref 26.0–34.0)
MCHC: 32.4 g/dL (ref 30.0–36.0)
MCV: 90.8 fL (ref 80.0–100.0)
Platelets: 316 K/uL (ref 150–400)
RBC: 2.82 MIL/uL — ABNORMAL LOW (ref 4.22–5.81)
RDW: 13.1 % (ref 11.5–15.5)
WBC: 15.3 K/uL — ABNORMAL HIGH (ref 4.0–10.5)
nRBC: 0 % (ref 0.0–0.2)

## 2024-09-11 LAB — GLUCOSE, CAPILLARY
Glucose-Capillary: 221 mg/dL — ABNORMAL HIGH (ref 70–99)
Glucose-Capillary: 159 mg/dL — ABNORMAL HIGH (ref 70–99)

## 2024-09-11 MED ORDER — AMLODIPINE BESYLATE 10 MG PO TABS
10.0000 mg | ORAL_TABLET | Freq: Every day | ORAL | 2 refills | Status: DC
  Filled 2024-09-11: qty 90, 90d supply, fill #0

## 2024-09-11 MED ORDER — SODIUM CHLORIDE 0.9 % IV SOLN
200.0000 mg | Freq: Every day | INTRAVENOUS | Status: DC
  Administered 2024-09-11: 09:00:00 200 mg via INTRAVENOUS
  Filled 2024-09-11: qty 10

## 2024-09-11 MED ORDER — TORSEMIDE 20 MG PO TABS
40.0000 mg | ORAL_TABLET | Freq: Every day | ORAL | 2 refills | Status: AC
  Filled 2024-09-11: qty 180, 90d supply, fill #0

## 2024-09-11 MED ORDER — ISOSORBIDE MONONITRATE ER 30 MG PO TB24
30.0000 mg | ORAL_TABLET | Freq: Every day | ORAL | Status: DC
  Administered 2024-09-11: 09:00:00 30 mg via ORAL
  Filled 2024-09-11: qty 1

## 2024-09-11 MED ORDER — ISOSORBIDE MONONITRATE ER 30 MG PO TB24
30.0000 mg | ORAL_TABLET | Freq: Every day | ORAL | 2 refills | Status: AC
  Filled 2024-09-11: qty 90, 90d supply, fill #0

## 2024-09-11 MED ORDER — CARVEDILOL 12.5 MG PO TABS
37.5000 mg | ORAL_TABLET | Freq: Two times a day (BID) | ORAL | 2 refills | Status: AC
  Filled 2024-09-11: qty 180, 30d supply, fill #0

## 2024-09-11 MED ORDER — ATORVASTATIN CALCIUM 80 MG PO TABS
80.0000 mg | ORAL_TABLET | Freq: Every day | ORAL | 2 refills | Status: AC
  Filled 2024-09-11 (×2): qty 90, 90d supply, fill #0

## 2024-09-11 NOTE — Discharge Summary (Signed)
 PATIENT DETAILS Name: Luis Wood Age: 53 y.o. Sex: male Date of Birth: March 09, 1971 MRN: [id number]. Admitting Physician: Eva KATHEE Pore, DO ERE:Tnmozb, Lucie, [place]  Admit Date: 09/09/2024 Discharge date: 09/11/2024  Recommendations for Outpatient Follow-up:  Follow up with PCP in 1-2 weeks Please obtain CMP/CBC in one week Please ensure follow-up with nephrology  Admitted From:  Home  Disposition: Home   Discharge Condition: good  CODE STATUS:   Code Status: Full Code   Diet recommendation:  Diet Order             Diet renal/carb modified with fluid restriction           Diet renal/carb modified with fluid restriction Diet-HS Snack? Nothing; Fluid restriction: 1800 mL Fluid; Room service appropriate? Yes; Fluid consistency: Thin  Diet effective now                    Brief Summary: Patient is a 53 y.o.  male with history of CKD 5, HTN, DM-2, HLD-who initially had URI symptoms approximately 2 weeks back-then had progressive worsening shortness of breath-found to have COVID-19 infection and possible CHF on CXR.   Significant events: 12/16>> admit to [facility]   Significant studies: 12/16>> CXR: No PNA 12/17>> renal ultrasound:   Significant microbiology data: 12/16>> COVID PCR: Positive 12/16>> influenza/RSV PCR: Negative 12/16>> blood culture: Staph hominis (likely a contaminant)    Procedures: None   Consults: Nephrology  Brief Hospital Course: Acute hypoxic respiratory failure Seems to have rapidly improved-on room air Suspect etiology is pulm edema due to decompensated heart failure in the setting of worsening renal function given rapid improvement-received Lasix  on admission. Remains stable overnight-on room air-ambulating in the room without any issues.   Acute on chronic HFpEF Clinically improved-on room air Treated with IV Lasix  Will be transition to Demadex  on discharge    COVID-19 infection CRP<3-given rapid improvement in  symptoms-suspect lung findings are mostly related to CHF-doubt PNA at this point ( ruled out) No longer on steroids-all antibiotics discontinued   CKD 5 Hypoxia resolved after diuretics-other electrolytes stable Followed closely by nephrology-no acute indications to start HD-responding well to diuretics-will be followed closely as an outpatient.   Normocytic anemia Secondary to CKD Defer iron /Aranesp to nephrology service   HTN History of resistant hypertension Continue Coreg /amlodipine /hydralazine /Imdur  on discharge Has been counseled regarding importance of compliance    HLD Crestor    DM-2 (A1c 5.1 on 12/16) SSI Resume Ozempic  on discharge.  Class 1 Obesity: Estimated body mass index is 35.22 kg/m as calculated from the following:   Height as of this encounter: 5' 7 (1.702 m).   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 (HCC)   Type 2 diabetes mellitus with diabetic chronic kidney disease (HCC)   Hyperlipidemia associated with type 2 diabetes mellitus (HCC)   Anemia due to chronic kidney disease   Obesity, Class II, BMI 35-39.9   Discharge Instructions:  Activity:  As tolerated    Discharge Instructions     (HEART FAILURE PATIENTS) Call MD:  Anytime you have any of the following symptoms: 1) 3 pound weight gain in 24 hours or 5 pounds in 1  week 2) shortness of breath, with or without a dry hacking cough 3) swelling in the hands, feet or stomach 4) if you have to sleep on extra pillows at night in order to breathe.   Complete by: As directed    Call MD for:  difficulty breathing, headache or visual disturbances    Complete by: As directed    Call MD for:  persistant nausea and vomiting   Complete by: As directed    Diet renal/carb modified with fluid restriction   Complete by: As directed    Discharge instructions   Complete by: As directed    Follow with Primary MD  [name], PA in 1-2 weeks  Please get a complete blood count and chemistry panel checked by your Primary MD at your next visit, and again as instructed by your Primary MD.  Get Medicines reviewed and adjusted: Please take all your medications with you for your next visit with your Primary MD  Laboratory/radiological data: Please request your Primary MD to go over all hospital tests and procedure/radiological results at the follow up, please ask your Primary MD to get all Hospital records sent to his/her office.  In some cases, they will be blood work, cultures and biopsy results pending at the time of your discharge. Please request that your primary care M.D. follows up on these results.  Also Note the following: If you experience worsening of your admission symptoms, develop shortness of breath, life threatening emergency, suicidal or homicidal thoughts you must seek medical attention immediately by calling 911 or calling your MD immediately  if symptoms less severe.  You must read complete instructions/literature along with all the possible adverse reactions/side effects for all the Medicines you take and that have been prescribed to you. Take any new Medicines after you have completely understood and accpet all the possible adverse reactions/side effects.   Do not drive when taking Pain medications or sleeping medications (Benzodaizepines)  Do not take more than prescribed Pain, Sleep and Anxiety Medications. It is not advisable to combine anxiety,sleep and pain medications without talking with your primary care practitioner  Special Instructions: If you have smoked or chewed Tobacco  in the last 2 yrs please stop  smoking, stop any regular Alcohol  and or any Recreational drug use.  Wear Seat belts while driving.  Please note: You were cared for by a hospitalist during your hospital stay. Once you are discharged, your primary care physician will handle any further medical issues. Please note that NO REFILLS for any discharge medications will be authorized once you are discharged, as it is imperative that you return to your primary care physician (or establish a relationship with a primary care physician if you do not have one) for your post hospital discharge needs so that they can reassess your need for medications and monitor your lab values.   Increase activity slowly   Complete by: As directed       Allergies as of 09/11/2024       Reactions   Aspirin Other (See Comments)   Dizziness and fainting, this was a childhood allergy        Medication List     TAKE these medications    albuterol  108 (90 Base) MCG/ACT inhaler Commonly known as: VENTOLIN  HFA Inhale 2 puffs into the lungs every 4 (four) hours as needed. For wheezing.   amLODipine  10 MG tablet Commonly known as: NORVASC  Take 1 tablet (10 mg total) by mouth daily.   carvedilol   12.5 MG tablet Commonly known as: COREG  Take 3 tablets (37.5 mg total) by mouth 2 (two) times daily with a meal. What changed: Another medication with the same name was removed. Continue taking this medication, and follow the directions you see here.   isosorbide  mononitrate 30 MG 24 hr tablet Commonly known as: IMDUR  Take 1 tablet (30 mg total) by mouth daily. Start taking on: September 12, 2024   Ozempic  (0.25 or 0.5 MG/DOSE) 2 MG/3ML Sopn Generic drug: Semaglutide (0.25 or 0.5MG /DOS) INJECT 0.25MG  INTO THE SKIN ONE TIME PER WEEK   rosuvastatin  40 MG tablet Commonly known as: Crestor  Take 1 tablet (40 mg total) by mouth daily.   Torsemide  40 MG Tabs Take 40 mg by mouth daily.   Vitamin D3 50 MCG (2000 UT) capsule Take 2,000 Units by mouth  daily.        Follow-up Information     [name], [place]. Schedule an appointment as soon as possible for a visit in 1 week(s).   Specialty: Physician Assistant Contact information: [address] [place] [address] [phone]         Dennise Hoes, MD Follow up.   Specialty: Nephrology Why: Office will call with date/time, If you dont hear from them,please give them a call Contact information: [address] [place] [address] [phone]                Allergies[1]   Other Procedures/Studies: ECHOCARDIOGRAM COMPLETE Result Date: 09/10/2024    ECHOCARDIOGRAM REPORT   Patient Name:   Luis Wood Date of Exam: 09/10/2024 Medical Rec #:  [id number]         Height:       67.0 in Accession #:    [id number]        Weight:       224.9 lb Date of Birth:  Feb 16, 1971        BSA:          2.126 m Patient Age:    53 years          BP:           153/80 mmHg Patient Gender: M                 HR:           108 bpm. Exam Location:  Inpatient Procedure: 2D Echo, Cardiac Doppler and Color Doppler (Both Spectral and Color            Flow Doppler were utilized during procedure). Indications:    Dyspnea  History:        Patient has prior history of Echocardiogram examinations, most                 recent 09/15/2023. Risk Factors:Diabetes, Dyslipidemia and                 Hypertension.  Sonographer:    Sherlean Dubin Referring Phys: DONALDA HERO [facility]  Sonographer Comments: Image acquisition challenging due to respiratory motion. IMPRESSIONS  1. Left ventricular ejection fraction, by estimation, is 60 to 65%. The left ventricle has normal function. The left ventricle has no regional wall motion abnormalities. There is moderate concentric left ventricular hypertrophy. Indeterminate diastolic filling due to E-A fusion.  2. Right ventricular systolic function is normal. The right ventricular size is normal. Tricuspid regurgitation signal is inadequate for assessing PA pressure.  3.  Left atrial size was mild to moderately dilated.  4. The mitral valve is normal in  structure. Trivial mitral valve regurgitation. No evidence of mitral stenosis.  5. The aortic valve is tricuspid. There is mild calcification of the aortic valve. Aortic valve regurgitation is not visualized. Aortic valve sclerosis/calcification is present, without any evidence of aortic stenosis.  6. The inferior vena cava is normal in size with greater than 50% respiratory variability, suggesting right atrial pressure of 3 mmHg. FINDINGS  Left Ventricle: Left ventricular ejection fraction, by estimation, is 60 to 65%. The left ventricle has normal function. The left ventricle has no regional wall motion abnormalities. The left ventricular internal cavity size was normal in size. There is  moderate concentric left ventricular hypertrophy. Indeterminate diastolic filling due to E-A fusion. Right Ventricle: The right ventricular size is normal. No increase in right ventricular wall thickness. Right ventricular systolic function is normal. Tricuspid regurgitation signal is inadequate for assessing PA pressure. The tricuspid regurgitant velocity is 1.05 m/s, and with an assumed right atrial pressure of 3 mmHg, the estimated right ventricular systolic pressure is 7.4 mmHg. Left Atrium: Left atrial size was mild to moderately dilated. Right Atrium: Right atrial size was normal in size. Pericardium: There is no evidence of pericardial effusion. Mitral Valve: The mitral valve is normal in structure. Trivial mitral valve regurgitation. No evidence of mitral valve stenosis. Tricuspid Valve: The tricuspid valve is normal in structure. Tricuspid valve regurgitation is trivial. No evidence of tricuspid stenosis. Aortic Valve: The aortic valve is tricuspid. There is mild calcification of the aortic valve. Aortic valve regurgitation is not visualized. Aortic valve sclerosis/calcification is present, without any evidence of aortic stenosis. Aortic  valve mean gradient measures 5.0 mmHg. Aortic valve peak gradient measures 9.4 mmHg. Aortic valve area, by VTI measures 2.34 cm. Pulmonic Valve: The pulmonic valve was normal in structure. Pulmonic valve regurgitation is trivial. No evidence of pulmonic stenosis. Aorta: The aortic root is normal in size and structure. Venous: The inferior vena cava is normal in size with greater than 50% respiratory variability, suggesting right atrial pressure of 3 mmHg. IAS/Shunts: No atrial level shunt detected by color flow Doppler.  LEFT VENTRICLE PLAX 2D LVIDd:         4.90 cm   Diastology LVIDs:         3.30 cm   LV e' medial:    10.10 cm/s LV PW:         1.30 cm   LV E/e' medial:  11.7 LV IVS:        1.40 cm   LV e' lateral:   7.51 cm/s LVOT diam:     2.00 cm   LV E/e' lateral: 15.7 LV SV:         55 LV SV Index:   26 LVOT Area:     3.14 cm  RIGHT VENTRICLE             IVC RV Basal diam:  3.70 cm     IVC diam: 1.30 cm RV Mid diam:    3.50 cm RV S prime:     14.70 cm/s TAPSE (M-mode): 1.9 cm LEFT ATRIUM             Index        RIGHT ATRIUM           Index LA diam:        4.60 cm 2.16 cm/m   RA Area:     13.20 cm LA Vol (A2C):   69.6 ml 32.74 ml/m  RA Volume:   32.80 ml  15.43 ml/m LA Vol (A4C):   65.6 ml 30.86 ml/m LA Biplane Vol: 68.2 ml 32.09 ml/m  AORTIC VALVE AV Area (Vmax):    2.10 cm AV Area (Vmean):   2.16 cm AV Area (VTI):     2.34 cm AV Vmax:           153.00 cm/s AV Vmean:          102.800 cm/s AV VTI:            0.235 m AV Peak Grad:      9.4 mmHg AV Mean Grad:      5.0 mmHg LVOT Vmax:         102.45 cm/s LVOT Vmean:        70.650 cm/s LVOT VTI:          0.175 m LVOT/AV VTI ratio: 0.74  AORTA Ao Root diam: 2.90 cm Ao Asc diam:  3.00 cm MITRAL VALVE                TRICUSPID VALVE MV Area (PHT): 5.38 cm     TR Peak grad:   4.4 mmHg MV Decel Time: 141 msec     TR Vmax:        105.00 cm/s MV E velocity: 118.00 cm/s MV A velocity: 138.00 cm/s  SHUNTS MV E/A ratio:  0.86         Systemic VTI:  0.18 m                              Systemic Diam: 2.00 cm Toribio Fuel MD Electronically signed by Toribio Fuel MD Signature Date/Time: 09/10/2024/8:30:14 PM    Final    US  RENAL Result Date: 09/10/2024 EXAM: US  Retroperitoneum Complete, Renal. 09/10/2024 12:50:00 AM TECHNIQUE: Real-time ultrasonography of the retroperitoneum renal was performed. COMPARISON: None available CLINICAL HISTORY: AKI (acute kidney injury). FINDINGS: FINDINGS: RIGHT KIDNEY/URETER: Right kidney measures 12.2 x 5.5 x 4.5 cm. Calculated volume: 158 ml. Normal cortical echogenicity. No hydronephrosis. No calculus. No mass. LEFT KIDNEY/URETER: Left kidney measures 9.3 x 4.7 x 3.9 cm. Calculated volume: 87 ml. Normal cortical echogenicity. No hydronephrosis. No calculus. No mass. BLADDER: The bladder is decompressed. IMPRESSION: 1. No acute findings. 2. Asymmetrically small left kidney, which may reflect chronic renal parenchymal disease. Electronically signed by: Oneil Devonshire MD 09/10/2024 01:31 AM EST RP Workstation: [id number]   DG Chest Portable 1 View Result Date: 09/09/2024 EXAM: 1 VIEW(S) XRAY OF THE CHEST 09/09/2024 05:24:08 AM COMPARISON: 01/03/2024 CLINICAL HISTORY: SOB FINDINGS: LUNGS AND PLEURA: Low lung volumes. Diffuse interstitial prominence with bilateral perihilar opacities. Imaging findings are concerning for multifocal pneumonia. No pleural effusion. No pneumothorax. HEART AND MEDIASTINUM: Cardiomegaly. Calcified aorta. BONES AND SOFT TISSUES: No acute osseous abnormality. IMPRESSION: 1. Imaging findings concerning for bilateral multifocal pneumonia. 2. Cardiomegaly. Electronically signed by: Waddell Calk MD 09/09/2024 05:32 AM EST RP Workstation: [id number]     TODAY-DAY OF DISCHARGE:  Subjective:   Luis Wood today has no headache,no chest abdominal pain,no new weakness tingling or numbness, feels much better wants to go home today.   Objective:   Blood pressure (!) 166/74, pulse (!) 108, temperature 98.4  F (36.9 C), temperature source Oral, resp. rate 13, height 5' 7 (1.702 m), weight 102 kg, SpO2 96%.  Intake/Output Summary (Last 24 hours) at 09/11/2024 1149 Last data filed at 09/10/2024 1554 Gross per 24 hour  Intake 110 ml  Output --  Net 110 ml  Filed Weights   09/09/24 0848  Weight: 102 kg    Exam: Awake Alert, Oriented *3, No new F.N deficits, Normal affect [place].AT,PERRAL Supple Neck,No JVD, No cervical lymphadenopathy appriciated.  Symmetrical Chest wall movement, Good air movement bilaterally, CTAB RRR,No Gallops,Rubs or new Murmurs, No Parasternal Heave +ve B.Sounds, Abd Soft, Non tender, No organomegaly appriciated, No rebound -guarding or rigidity. No Cyanosis, Clubbing or edema, No new Rash or bruise   PERTINENT RADIOLOGIC STUDIES: ECHOCARDIOGRAM COMPLETE Result Date: 09/10/2024    ECHOCARDIOGRAM REPORT   Patient Name:   Luis Wood Date of Exam: 09/10/2024 Medical Rec #:  [id number]         Height:       67.0 in Accession #:    [id number]        Weight:       224.9 lb Date of Birth:  May 28, 1971        BSA:          2.126 m Patient Age:    53 years          BP:           153/80 mmHg Patient Gender: M                 HR:           108 bpm. Exam Location:  Inpatient Procedure: 2D Echo, Cardiac Doppler and Color Doppler (Both Spectral and Color            Flow Doppler were utilized during procedure). Indications:    Dyspnea  History:        Patient has prior history of Echocardiogram examinations, most                 recent 09/15/2023. Risk Factors:Diabetes, Dyslipidemia and                 Hypertension.  Sonographer:    Sherlean Dubin Referring Phys: DONALDA HERO [facility]  Sonographer Comments: Image acquisition challenging due to respiratory motion. IMPRESSIONS  1. Left ventricular ejection fraction, by estimation, is 60 to 65%. The left ventricle has normal function. The left ventricle has no regional wall motion abnormalities. There is moderate concentric left  ventricular hypertrophy. Indeterminate diastolic filling due to E-A fusion.  2. Right ventricular systolic function is normal. The right ventricular size is normal. Tricuspid regurgitation signal is inadequate for assessing PA pressure.  3. Left atrial size was mild to moderately dilated.  4. The mitral valve is normal in structure. Trivial mitral valve regurgitation. No evidence of mitral stenosis.  5. The aortic valve is tricuspid. There is mild calcification of the aortic valve. Aortic valve regurgitation is not visualized. Aortic valve sclerosis/calcification is present, without any evidence of aortic stenosis.  6. The inferior vena cava is normal in size with greater than 50% respiratory variability, suggesting right atrial pressure of 3 mmHg. FINDINGS  Left Ventricle: Left ventricular ejection fraction, by estimation, is 60 to 65%. The left ventricle has normal function. The left ventricle has no regional wall motion abnormalities. The left ventricular internal cavity size was normal in size. There is  moderate concentric left ventricular hypertrophy. Indeterminate diastolic filling due to E-A fusion. Right Ventricle: The right ventricular size is normal. No increase in right ventricular wall thickness. Right ventricular systolic function is normal. Tricuspid regurgitation signal is inadequate for assessing PA pressure. The tricuspid regurgitant velocity is 1.05 m/s, and with an assumed right atrial pressure of 3 mmHg, the estimated right  ventricular systolic pressure is 7.4 mmHg. Left Atrium: Left atrial size was mild to moderately dilated. Right Atrium: Right atrial size was normal in size. Pericardium: There is no evidence of pericardial effusion. Mitral Valve: The mitral valve is normal in structure. Trivial mitral valve regurgitation. No evidence of mitral valve stenosis. Tricuspid Valve: The tricuspid valve is normal in structure. Tricuspid valve regurgitation is trivial. No evidence of tricuspid  stenosis. Aortic Valve: The aortic valve is tricuspid. There is mild calcification of the aortic valve. Aortic valve regurgitation is not visualized. Aortic valve sclerosis/calcification is present, without any evidence of aortic stenosis. Aortic valve mean gradient measures 5.0 mmHg. Aortic valve peak gradient measures 9.4 mmHg. Aortic valve area, by VTI measures 2.34 cm. Pulmonic Valve: The pulmonic valve was normal in structure. Pulmonic valve regurgitation is trivial. No evidence of pulmonic stenosis. Aorta: The aortic root is normal in size and structure. Venous: The inferior vena cava is normal in size with greater than 50% respiratory variability, suggesting right atrial pressure of 3 mmHg. IAS/Shunts: No atrial level shunt detected by color flow Doppler.  LEFT VENTRICLE PLAX 2D LVIDd:         4.90 cm   Diastology LVIDs:         3.30 cm   LV e' medial:    10.10 cm/s LV PW:         1.30 cm   LV E/e' medial:  11.7 LV IVS:        1.40 cm   LV e' lateral:   7.51 cm/s LVOT diam:     2.00 cm   LV E/e' lateral: 15.7 LV SV:         55 LV SV Index:   26 LVOT Area:     3.14 cm  RIGHT VENTRICLE             IVC RV Basal diam:  3.70 cm     IVC diam: 1.30 cm RV Mid diam:    3.50 cm RV S prime:     14.70 cm/s TAPSE (M-mode): 1.9 cm LEFT ATRIUM             Index        RIGHT ATRIUM           Index LA diam:        4.60 cm 2.16 cm/m   RA Area:     13.20 cm LA Vol (A2C):   69.6 ml 32.74 ml/m  RA Volume:   32.80 ml  15.43 ml/m LA Vol (A4C):   65.6 ml 30.86 ml/m LA Biplane Vol: 68.2 ml 32.09 ml/m  AORTIC VALVE AV Area (Vmax):    2.10 cm AV Area (Vmean):   2.16 cm AV Area (VTI):     2.34 cm AV Vmax:           153.00 cm/s AV Vmean:          102.800 cm/s AV VTI:            0.235 m AV Peak Grad:      9.4 mmHg AV Mean Grad:      5.0 mmHg LVOT Vmax:         102.45 cm/s LVOT Vmean:        70.650 cm/s LVOT VTI:          0.175 m LVOT/AV VTI ratio: 0.74  AORTA Ao Root diam: 2.90 cm Ao Asc diam:  3.00 cm MITRAL VALVE  TRICUSPID VALVE MV Area (PHT): 5.38 cm     TR Peak grad:   4.4 mmHg MV Decel Time: 141 msec     TR Vmax:        105.00 cm/s MV E velocity: 118.00 cm/s MV A velocity: 138.00 cm/s  SHUNTS MV E/A ratio:  0.86         Systemic VTI:  0.18 m                             Systemic Diam: 2.00 cm Toribio Fuel MD Electronically signed by Toribio Fuel MD Signature Date/Time: 09/10/2024/8:30:14 PM    Final    US  RENAL Result Date: 09/10/2024 EXAM: US  Retroperitoneum Complete, Renal. 09/10/2024 12:50:00 AM TECHNIQUE: Real-time ultrasonography of the retroperitoneum renal was performed. COMPARISON: None available CLINICAL HISTORY: AKI (acute kidney injury). FINDINGS: FINDINGS: RIGHT KIDNEY/URETER: Right kidney measures 12.2 x 5.5 x 4.5 cm. Calculated volume: 158 ml. Normal cortical echogenicity. No hydronephrosis. No calculus. No mass. LEFT KIDNEY/URETER: Left kidney measures 9.3 x 4.7 x 3.9 cm. Calculated volume: 87 ml. Normal cortical echogenicity. No hydronephrosis. No calculus. No mass. BLADDER: The bladder is decompressed. IMPRESSION: 1. No acute findings. 2. Asymmetrically small left kidney, which may reflect chronic renal parenchymal disease. Electronically signed by: Oneil Devonshire MD 09/10/2024 01:31 AM EST RP Workstation: [id number]     PERTINENT LAB RESULTS: CBC: Recent Labs    09/10/24 0510 09/11/24 0334  WBC 8.5 15.3*  HGB 8.8* 8.3*  HCT 26.6* 25.6*  PLT 311 316   CMET CMP     Component Value Date/Time   NA 136 09/11/2024 0334   NA 137 07/20/2022 0859   K 4.8 09/11/2024 0334   CL 105 09/11/2024 0334   CO2 17 (L) 09/11/2024 0334   GLUCOSE 160 (H) 09/11/2024 0334   BUN 82 (H) 09/11/2024 0334   BUN 33 (H) 07/20/2022 0859   CREATININE 9.95 (H) 09/11/2024 0334   CALCIUM  8.0 (L) 09/11/2024 0334   PROT 6.2 (L) 09/10/2024 0510   PROT 6.6 07/20/2022 0859   ALBUMIN 3.3 (L) 09/11/2024 0334   ALBUMIN 3.6 (L) 07/20/2022 0859   AST 20 09/10/2024 0510   ALT 11 09/10/2024 0510   ALKPHOS  76 09/10/2024 0510   BILITOT <0.2 09/10/2024 0510   BILITOT <0.2 07/20/2022 0859   GFR 15.06 (L) 02/07/2023 1525   EGFR 22 (L) 07/20/2022 0859   GFRNONAA 6 (L) 09/11/2024 0334    GFR Estimated Creatinine Clearance: 10 mL/min (A) (by C-G formula based on SCr of 9.95 mg/dL (H)). No results for input(s): LIPASE, AMYLASE in the last 72 hours. No results for input(s): CKTOTAL, CKMB, CKMBINDEX, TROPONINI in the last 72 hours. Invalid input(s): POCBNP No results for input(s): DDIMER in the last 72 hours. Recent Labs    09/09/24 2227  HGBA1C 5.1   No results for input(s): CHOL, HDL, LDLCALC, TRIG, CHOLHDL, LDLDIRECT in the last 72 hours. No results for input(s): TSH, T4TOTAL, T3FREE, THYROIDAB in the last 72 hours.  Invalid input(s): FREET3 Recent Labs    09/09/24 2227 09/10/24 1118  FERRITIN  --  125  TIBC 302  --   IRON  38*  --    Coags: No results for input(s): INR in the last 72 hours.  Invalid input(s): PT Microbiology: Recent Results (from the past 240 hours)  Resp panel by RT-PCR (RSV, Flu A&B, Covid) Anterior Nasal Swab     Status: Abnormal   Collection Time:  09/09/24  5:16 AM   Specimen: Anterior Nasal Swab  Result Value Ref Range Status   SARS Coronavirus 2 by RT PCR POSITIVE (A) NEGATIVE Final    Comment: (NOTE) SARS-CoV-2 target nucleic acids are DETECTED.  The SARS-CoV-2 RNA is generally detectable in upper respiratory specimens during the acute phase of infection. Positive results are indicative of the presence of the identified virus, but do not rule out bacterial infection or co-infection with other pathogens not detected by the test. Clinical correlation with patient history and other diagnostic information is necessary to determine patient infection status. The expected result is Negative.  Fact Sheet for Patients: bloggercourse.com  Fact Sheet for Healthcare  Providers: seriousbroker.it  This test is not yet approved or cleared by the [place]  FDA and  has been authorized for detection and/or diagnosis of SARS-CoV-2 by FDA under an Emergency Use Authorization (EUA).  This EUA will remain in effect (meaning this test can be used) for the duration of  the COVID-19 declaration under Section 564(b)(1) of the A ct, 21 U.S.C. section 360bbb-3(b)(1), unless the authorization is terminated or revoked sooner.     Influenza A by PCR NEGATIVE NEGATIVE Final   Influenza B by PCR NEGATIVE NEGATIVE Final    Comment: (NOTE) The Xpert Xpress SARS-CoV-2/FLU/RSV plus assay is intended as an aid in the diagnosis of influenza from Nasopharyngeal swab specimens and should not be used as a sole basis for treatment. Nasal washings and aspirates are unacceptable for Xpert Xpress SARS-CoV-2/FLU/RSV testing.  Fact Sheet for Patients: bloggercourse.com  Fact Sheet for Healthcare Providers: seriousbroker.it  This test is not yet approved or cleared by the [place]  FDA and has been authorized for detection and/or diagnosis of SARS-CoV-2 by FDA under an Emergency Use Authorization (EUA). This EUA will remain in effect (meaning this test can be used) for the duration of the COVID-19 declaration under Section 564(b)(1) of the Act, 21 U.S.C. section 360bbb-3(b)(1), unless the authorization is terminated or revoked.     Resp Syncytial Virus by PCR NEGATIVE NEGATIVE Final    Comment: (NOTE) Fact Sheet for Patients: bloggercourse.com  Fact Sheet for Healthcare Providers: seriousbroker.it  This test is not yet approved or cleared by the [place]  FDA and has been authorized for detection and/or diagnosis of SARS-CoV-2 by FDA under an Emergency Use Authorization (EUA). This EUA will remain in effect (meaning this test can be  used) for the duration of the COVID-19 declaration under Section 564(b)(1) of the Act, 21 U.S.C. section 360bbb-3(b)(1), unless the authorization is terminated or revoked.  Performed at [facility], [address], [place], [place] [address]   Blood culture (routine x 2)     Status: Abnormal (Preliminary result)   Collection Time: 09/09/24  5:33 AM   Specimen: BLOOD  Result Value Ref Range Status   Specimen Description   Final    BLOOD LEFT ANTECUBITAL Performed at [facility], [address] [place], [place] [address]    Special Requests   Final    BOTTLES DRAWN AEROBIC AND ANAEROBIC Blood Culture adequate volume Performed at [facility], [address] [place], [place] [address]    Culture  Setup Time   Final    GRAM POSITIVE COCCI IN CLUSTERS AEROBIC BOTTLE ONLY CRITICAL RESULT CALLED TO, READ BACK BY AND VERIFIED WITH: PHARMD G ABBOTT 12/217/2025 @ 0537 BY AB    Culture (A)  Final    STAPHYLOCOCCUS HOMINIS THE SIGNIFICANCE OF ISOLATING THIS ORGANISM FROM A  SINGLE SET OF BLOOD CULTURES WHEN MULTIPLE SETS ARE DRAWN IS UNCERTAIN. PLEASE NOTIFY THE MICROBIOLOGY DEPARTMENT WITHIN ONE WEEK IF SPECIATION AND SENSITIVITIES ARE REQUIRED. Performed at [facility], [address] [place], [place] [address]    Report Status PENDING  Incomplete  Blood Culture ID Panel (Reflexed)     Status: Abnormal   Collection Time: 09/09/24  5:33 AM  Result Value Ref Range Status   Enterococcus faecalis NOT DETECTED NOT DETECTED Final   Enterococcus Faecium NOT DETECTED NOT DETECTED Final   Listeria monocytogenes NOT DETECTED NOT DETECTED Final   Staphylococcus species DETECTED (A) NOT DETECTED Final    Comment: CRITICAL RESULT CALLED TO, READ BACK BY AND VERIFIED WITH: PHARMD G ABBOTT 09/10/2024 @ 0537 BY AB    Staphylococcus aureus (BCID) NOT DETECTED NOT DETECTED Final   Staphylococcus epidermidis NOT DETECTED NOT  DETECTED Final   Staphylococcus lugdunensis NOT DETECTED NOT DETECTED Final   Streptococcus species NOT DETECTED NOT DETECTED Final   Streptococcus agalactiae NOT DETECTED NOT DETECTED Final   Streptococcus pneumoniae NOT DETECTED NOT DETECTED Final   Streptococcus pyogenes NOT DETECTED NOT DETECTED Final   A.calcoaceticus-baumannii NOT DETECTED NOT DETECTED Final   Bacteroides fragilis NOT DETECTED NOT DETECTED Final   Enterobacterales NOT DETECTED NOT DETECTED Final   Enterobacter cloacae complex NOT DETECTED NOT DETECTED Final   Escherichia coli NOT DETECTED NOT DETECTED Final   Klebsiella aerogenes NOT DETECTED NOT DETECTED Final   Klebsiella oxytoca NOT DETECTED NOT DETECTED Final   Klebsiella pneumoniae NOT DETECTED NOT DETECTED Final   Proteus species NOT DETECTED NOT DETECTED Final   Salmonella species NOT DETECTED NOT DETECTED Final   Serratia marcescens NOT DETECTED NOT DETECTED Final   Haemophilus influenzae NOT DETECTED NOT DETECTED Final   Neisseria meningitidis NOT DETECTED NOT DETECTED Final   Pseudomonas aeruginosa NOT DETECTED NOT DETECTED Final   Stenotrophomonas maltophilia NOT DETECTED NOT DETECTED Final   Candida albicans NOT DETECTED NOT DETECTED Final   Candida auris NOT DETECTED NOT DETECTED Final   Candida glabrata NOT DETECTED NOT DETECTED Final   Candida krusei NOT DETECTED NOT DETECTED Final   Candida parapsilosis NOT DETECTED NOT DETECTED Final   Candida tropicalis NOT DETECTED NOT DETECTED Final   Cryptococcus neoformans/gattii NOT DETECTED NOT DETECTED Final    Comment: Performed at [facility], [address] [place], [place] [address]  Blood culture (routine x 2)     Status: None (Preliminary result)   Collection Time: 09/09/24  5:43 AM   Specimen: BLOOD  Result Value Ref Range Status   Specimen Description   Final    BLOOD RIGHT ANTECUBITAL Performed at [facility], [address], [place], [place] [address]     Special Requests   Final    NONE Performed at [facility], [address] [place], [place] [address]    Culture   Final    NO GROWTH 2 DAYS Performed at [facility], [address], [place], [place] [address]    Report Status PENDING  Incomplete    FURTHER DISCHARGE INSTRUCTIONS:  Get Medicines reviewed and adjusted: Please take all your medications with you for your next visit with your Primary MD  Laboratory/radiological data: Please request your Primary MD to go over all hospital tests and procedure/radiological results at the follow up, please ask your Primary MD to get all Hospital records sent to his/her office.  In some cases, they will be blood work, cultures  and biopsy results pending at the time of your discharge. Please request that your primary care M.D. goes through all the records of your hospital data and follows up on these results.  Also Note the following: If you experience worsening of your admission symptoms, develop shortness of breath, life threatening emergency, suicidal or homicidal thoughts you must seek medical attention immediately by calling 911 or calling your MD immediately  if symptoms less severe.  You must read complete instructions/literature along with all the possible adverse reactions/side effects for all the Medicines you take and that have been prescribed to you. Take any new Medicines after you have completely understood and accpet all the possible adverse reactions/side effects.   Do not drive when taking Pain medications or sleeping medications (Benzodaizepines)  Do not take more than prescribed Pain, Sleep and Anxiety Medications. It is not advisable to combine anxiety,sleep and pain medications without talking with your primary care practitioner  Special Instructions: If you have smoked or chewed Tobacco  in the last 2 yrs please stop smoking, stop any regular Alcohol  and or any Recreational drug use.  Wear Seat  belts while driving.  Please note: You were cared for by a hospitalist during your hospital stay. Once you are discharged, your primary care physician will handle any further medical issues. Please note that NO REFILLS for any discharge medications will be authorized once you are discharged, as it is imperative that you return to your primary care physician (or establish a relationship with a primary care physician if you do not have one) for your post hospital discharge needs so that they can reassess your need for medications and monitor your lab values.  Total Time spent coordinating discharge including counseling, education and face to face time equals greater than 30 minutes.  Signed: Donalda Applebaum 09/11/2024 11:49 AM      [1]  Allergies Allergen Reactions   Aspirin Other (See Comments)    Dizziness and fainting, this was a childhood allergy

## 2024-09-11 NOTE — Progress Notes (Signed)
 Heart Failure Navigator Progress Note  Assessed for Heart & Vascular TOC clinic readiness.  Patient does not meet criteria due to EF 60-65%, advanced CKD, f/u with renal, NO HF TOC. .   Navigator will sign off at this time.   Stephane Haddock, BSN, Scientist, Clinical (histocompatibility And Immunogenetics) Only

## 2024-09-11 NOTE — TOC Transition Note (Signed)
 Transition of Care [facility]) - Discharge Note   Patient Details  Name: Luis Wood MRN: [id number] Date of Birth: 12-29-1970  [facility] CM/SW Contact:  Landry DELENA Senters, RN Phone Number: 09/11/2024, 11:59 AM   Clinical Narrative:     Patient will be discharging home today, with transportation plan in place.   Patient lives alone, has support when needed, works, drives himself, has PCP, no DME needs at home.   No needs identified by CM.   Final next level of care: Home/Self Care Barriers to Discharge: No Barriers Identified   Patient Goals and CMS Choice            Discharge Placement                       Discharge Plan and Services Additional resources added to the After Visit Summary for                                       Social Drivers of Health (SDOH) Interventions SDOH Screenings   Food Insecurity: No Food Insecurity (09/10/2024)  Housing: Unknown (09/10/2024)  Transportation Needs: No Transportation Needs (09/10/2024)  Utilities: Not At Risk (09/10/2024)  Depression (PHQ2-9): Low Risk (02/07/2023)  Financial Resource Strain: Low Risk (01/03/2024)  Physical Activity: Insufficiently Active (01/03/2024)  Social Connections: Moderately Integrated (01/03/2024)  Stress: No Stress Concern Present (01/03/2024)  Tobacco Use: Low Risk (09/09/2024)     Readmission Risk Interventions     No data to display

## 2024-09-11 NOTE — Progress Notes (Signed)
 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 Has followed up with Dr. Dolan since 2023 -not exhibiting any uremic symptoms and volume status is stable. No indications to start renal replacement therapy. Overall, I would like to get him set up for home therapies down the road and a renal transplant referral. Therefore, he is okay for discharge from my end, likely won't see much improvement with his Cr moving forward. Will need to ensure VERY close follow up with us  which he is now motivated to do -can stop lasix  IV, can discharge on torsemide  40mg  daily -Avoid nephrotoxic medications including NSAIDs and iodinated intravenous contrast exposure unless the latter is absolutely indicated.  Preferred narcotic agents for pain control are hydromorphone , fentanyl, and methadone. Morphine should not be used. Avoid Baclofen and avoid oral sodium phosphate  and magnesium citrate based laxatives / bowel preps. Continue strict Input and Output monitoring. Will monitor the patient closely with you and intervene or adjust therapy as indicated by changes in clinical status/labs    Malignant HTN -in the context of nonadherence -BP improving -diuretics as above   AHRF, improving -in the context of multifocal pna and pulm edema -on rocephin  and zithromax  per primary service -improved -diuretics: stopped iv lasix , transition to torsemide  40mg  daily   Anemia of CKD -iron  deplete, started IV Fe, revisit as outpatient. Received first dose of venofer  200mg  12/17 -transfuse prn for hgb <7   Secondary hyperparathyroidism -no BMD labs since 2023, will repeat while he's here--pending   DM2 -per primary service -on ozempic  OP  Okay for discharge from my end. Will arrange for OP follow up with us  (hopefully in the next week or even tomorrow). Discussed with primary service. Once he gets to the office, will arrange home visit/education for PD and will  order transplant referral. Will also readdress his BP and labs as an outpatient.  Subjective:   Patient seen in room. He reports feeling really well. Has a robust appetite, denies any dysgeusia, N/V, chest pain, SOB, DOE. He reports that his breathing is back to baseline. He has given home therapies more thought and he is interested in He is motivated to get back on track with us    Objective:   BP (!) 166/74 (BP Location: Left Wrist)   Pulse (!) 108   Temp 98.4 F (36.9 C) (Oral)   Resp 13   Ht 5' 7 (1.702 m)   Wt 102 kg   SpO2 96%   BMI 35.22 kg/m   Intake/Output Summary (Last 24 hours) at 09/11/2024 1146 Last data filed at 09/10/2024 1554 Gross per 24 hour  Intake 110 ml  Output --  Net 110 ml   Weight change:   Physical Exam: Gen: NAD, nontoxic appearing CVS: RRR Resp: normal wob, unlabored, speaking in full sentences Abd: obese, soft Ext: no edema Neuro: awake, alert, no tremors/myoclonic jerks observed  Imaging: ECHOCARDIOGRAM COMPLETE Result Date: 09/10/2024    ECHOCARDIOGRAM REPORT   Patient Name:   Luis Wood Date of Exam: 09/10/2024 Medical Rec #:  [id number]         Height:       67.0 in Accession #:    [id number]        Weight:       224.9 lb Date of Birth:  25-Jan-1971        BSA:          2.126 m Patient Age:    53  years          BP:           153/80 mmHg Patient Gender: M                 HR:           108 bpm. Exam Location:  Inpatient Procedure: 2D Echo, Cardiac Doppler and Color Doppler (Both Spectral and Color            Flow Doppler were utilized during procedure). Indications:    Dyspnea  History:        Patient has prior history of Echocardiogram examinations, most                 recent 09/15/2023. Risk Factors:Diabetes, Dyslipidemia and                 Hypertension.  Sonographer:    Sherlean Dubin Referring Phys: DONALDA HERO [facility]  Sonographer Comments: Image acquisition challenging due to respiratory motion. IMPRESSIONS  1. Left ventricular  ejection fraction, by estimation, is 60 to 65%. The left ventricle has normal function. The left ventricle has no regional wall motion abnormalities. There is moderate concentric left ventricular hypertrophy. Indeterminate diastolic filling due to E-A fusion.  2. Right ventricular systolic function is normal. The right ventricular size is normal. Tricuspid regurgitation signal is inadequate for assessing PA pressure.  3. Left atrial size was mild to moderately dilated.  4. The mitral valve is normal in structure. Trivial mitral valve regurgitation. No evidence of mitral stenosis.  5. The aortic valve is tricuspid. There is mild calcification of the aortic valve. Aortic valve regurgitation is not visualized. Aortic valve sclerosis/calcification is present, without any evidence of aortic stenosis.  6. The inferior vena cava is normal in size with greater than 50% respiratory variability, suggesting right atrial pressure of 3 mmHg. FINDINGS  Left Ventricle: Left ventricular ejection fraction, by estimation, is 60 to 65%. The left ventricle has normal function. The left ventricle has no regional wall motion abnormalities. The left ventricular internal cavity size was normal in size. There is  moderate concentric left ventricular hypertrophy. Indeterminate diastolic filling due to E-A fusion. Right Ventricle: The right ventricular size is normal. No increase in right ventricular wall thickness. Right ventricular systolic function is normal. Tricuspid regurgitation signal is inadequate for assessing PA pressure. The tricuspid regurgitant velocity is 1.05 m/s, and with an assumed right atrial pressure of 3 mmHg, the estimated right ventricular systolic pressure is 7.4 mmHg. Left Atrium: Left atrial size was mild to moderately dilated. Right Atrium: Right atrial size was normal in size. Pericardium: There is no evidence of pericardial effusion. Mitral Valve: The mitral valve is normal in structure. Trivial mitral valve  regurgitation. No evidence of mitral valve stenosis. Tricuspid Valve: The tricuspid valve is normal in structure. Tricuspid valve regurgitation is trivial. No evidence of tricuspid stenosis. Aortic Valve: The aortic valve is tricuspid. There is mild calcification of the aortic valve. Aortic valve regurgitation is not visualized. Aortic valve sclerosis/calcification is present, without any evidence of aortic stenosis. Aortic valve mean gradient measures 5.0 mmHg. Aortic valve peak gradient measures 9.4 mmHg. Aortic valve area, by VTI measures 2.34 cm. Pulmonic Valve: The pulmonic valve was normal in structure. Pulmonic valve regurgitation is trivial. No evidence of pulmonic stenosis. Aorta: The aortic root is normal in size and structure. Venous: The inferior vena cava is normal in size with greater than 50% respiratory variability, suggesting right atrial pressure of 3  mmHg. IAS/Shunts: No atrial level shunt detected by color flow Doppler.  LEFT VENTRICLE PLAX 2D LVIDd:         4.90 cm   Diastology LVIDs:         3.30 cm   LV e' medial:    10.10 cm/s LV PW:         1.30 cm   LV E/e' medial:  11.7 LV IVS:        1.40 cm   LV e' lateral:   7.51 cm/s LVOT diam:     2.00 cm   LV E/e' lateral: 15.7 LV SV:         55 LV SV Index:   26 LVOT Area:     3.14 cm  RIGHT VENTRICLE             IVC RV Basal diam:  3.70 cm     IVC diam: 1.30 cm RV Mid diam:    3.50 cm RV S prime:     14.70 cm/s TAPSE (M-mode): 1.9 cm LEFT ATRIUM             Index        RIGHT ATRIUM           Index LA diam:        4.60 cm 2.16 cm/m   RA Area:     13.20 cm LA Vol (A2C):   69.6 ml 32.74 ml/m  RA Volume:   32.80 ml  15.43 ml/m LA Vol (A4C):   65.6 ml 30.86 ml/m LA Biplane Vol: 68.2 ml 32.09 ml/m  AORTIC VALVE AV Area (Vmax):    2.10 cm AV Area (Vmean):   2.16 cm AV Area (VTI):     2.34 cm AV Vmax:           153.00 cm/s AV Vmean:          102.800 cm/s AV VTI:            0.235 m AV Peak Grad:      9.4 mmHg AV Mean Grad:      5.0 mmHg LVOT  Vmax:         102.45 cm/s LVOT Vmean:        70.650 cm/s LVOT VTI:          0.175 m LVOT/AV VTI ratio: 0.74  AORTA Ao Root diam: 2.90 cm Ao Asc diam:  3.00 cm MITRAL VALVE                TRICUSPID VALVE MV Area (PHT): 5.38 cm     TR Peak grad:   4.4 mmHg MV Decel Time: 141 msec     TR Vmax:        105.00 cm/s MV E velocity: 118.00 cm/s MV A velocity: 138.00 cm/s  SHUNTS MV E/A ratio:  0.86         Systemic VTI:  0.18 m                             Systemic Diam: 2.00 cm Toribio Fuel MD Electronically signed by Toribio Fuel MD Signature Date/Time: 09/10/2024/8:30:14 PM    Final    US  RENAL Result Date: 09/10/2024 EXAM: US  Retroperitoneum Complete, Renal. 09/10/2024 12:50:00 AM TECHNIQUE: Real-time ultrasonography of the retroperitoneum renal was performed. COMPARISON: None available CLINICAL HISTORY: AKI (acute kidney injury). FINDINGS: FINDINGS: RIGHT KIDNEY/URETER: Right kidney measures 12.2 x 5.5 x 4.5 cm. Calculated volume: 158  ml. Normal cortical echogenicity. No hydronephrosis. No calculus. No mass. LEFT KIDNEY/URETER: Left kidney measures 9.3 x 4.7 x 3.9 cm. Calculated volume: 87 ml. Normal cortical echogenicity. No hydronephrosis. No calculus. No mass. BLADDER: The bladder is decompressed. IMPRESSION: 1. No acute findings. 2. Asymmetrically small left kidney, which may reflect chronic renal parenchymal disease. Electronically signed by: Oneil Devonshire MD 09/10/2024 01:31 AM EST RP Workstation: [id number]    Labs: BMET Recent Labs  Lab 09/09/24 0516 09/10/24 0510 09/10/24 1118 09/11/24 0334  NA 139 137 137 136  K 3.8 4.7 4.6 4.8  CL 102 106 105 105  CO2 21* 18* 18* 17*  GLUCOSE 154* 160* 230* 160*  BUN 59* 68* 70* 82*  CREATININE 8.44* 8.80* 8.98* 9.95*  CALCIUM  8.2* 7.8* 7.7* 8.0*  PHOS  --   --  4.8*  4.8* 5.1*   CBC Recent Labs  Lab 09/09/24 0516 09/10/24 0510 09/11/24 0334  WBC 13.0* 8.5 15.3*  NEUTROABS  --  7.8*  --   HGB 9.3* 8.8* 8.3*  HCT 27.8* 26.6* 25.6*  MCV  89.7 90.2 90.8  PLT 335 311 316    Medications:     azithromycin   500 mg Oral Daily   carvedilol   37.5 mg Oral BID WC   heparin   5,000 Units Subcutaneous Q8H   hydrALAZINE   100 mg Oral TID   insulin  aspart  0-6 Units Subcutaneous TID WC   isosorbide  mononitrate  30 mg Oral Daily   rosuvastatin   40 mg Oral Daily      Ephriam Stank, MD [facility] 09/11/2024, 11:46 AM

## 2024-09-11 NOTE — Progress Notes (Signed)
 Pt discharged home with no services. Discharge instructions gone over with pt prior to discharge including medication changes and follow up appointments. Pt with new medications prescribed and to be picked up from [facility] pharmacy on way out. Pts IV and tele monitor removed prior to discharge. Pt gathered all belongings and changed into regular clothes. Pt transported to main entrance via wheelchair and pt driven home by friend/family.

## 2024-09-11 NOTE — Plan of Care (Signed)
  Problem: Education: Goal: Knowledge of risk factors and measures for prevention of condition will improve Outcome: Progressing   Problem: Coping: Goal: Psychosocial and spiritual needs will be supported Outcome: Progressing   Problem: Respiratory: Goal: Will maintain a patent airway Outcome: Progressing Goal: Complications related to the disease process, condition or treatment will be avoided or minimized Outcome: Progressing   Problem: Education: Goal: Knowledge of General Education information will improve Description: Including pain rating scale, medication(s)/side effects and non-pharmacologic comfort measures Outcome: Progressing   Problem: Health Behavior/Discharge Planning: Goal: Ability to manage health-related needs will improve Outcome: Progressing   Problem: Clinical Measurements: Goal: Ability to maintain clinical measurements within normal limits will improve Outcome: Progressing Goal: Will remain free from infection Outcome: Progressing Goal: Diagnostic test results will improve Outcome: Progressing Goal: Respiratory complications will improve Outcome: Progressing Goal: Cardiovascular complication will be avoided Outcome: Progressing   Problem: Activity: Goal: Risk for activity intolerance will decrease Outcome: Progressing   Problem: Nutrition: Goal: Adequate nutrition will be maintained Outcome: Progressing   Problem: Coping: Goal: Level of anxiety will decrease Outcome: Progressing   Problem: Elimination: Goal: Will not experience complications related to bowel motility Outcome: Progressing Goal: Will not experience complications related to urinary retention Outcome: Progressing   Problem: Pain Managment: Goal: General experience of comfort will improve and/or be controlled Outcome: Progressing   Problem: Safety: Goal: Ability to remain free from injury will improve Outcome: Progressing   Problem: Skin Integrity: Goal: Risk for impaired  skin integrity will decrease Outcome: Progressing   Problem: Activity: Goal: Ability to tolerate increased activity will improve Outcome: Progressing   Problem: Clinical Measurements: Goal: Ability to maintain a body temperature in the normal range will improve Outcome: Progressing   Problem: Respiratory: Goal: Ability to maintain adequate ventilation will improve Outcome: Progressing Goal: Ability to maintain a clear airway will improve Outcome: Progressing   Problem: Education: Goal: Ability to describe self-care measures that may prevent or decrease complications (Diabetes Survival Skills Education) will improve Outcome: Progressing Goal: Individualized Educational Video(s) Outcome: Progressing   Problem: Coping: Goal: Ability to adjust to condition or change in health will improve Outcome: Progressing   Problem: Fluid Volume: Goal: Ability to maintain a balanced intake and output will improve Outcome: Progressing   Problem: Health Behavior/Discharge Planning: Goal: Ability to identify and utilize available resources and services will improve Outcome: Progressing Goal: Ability to manage health-related needs will improve Outcome: Progressing   Problem: Metabolic: Goal: Ability to maintain appropriate glucose levels will improve Outcome: Progressing   Problem: Nutritional: Goal: Maintenance of adequate nutrition will improve Outcome: Progressing Goal: Progress toward achieving an optimal weight will improve Outcome: Progressing   Problem: Skin Integrity: Goal: Risk for impaired skin integrity will decrease Outcome: Progressing   Problem: Tissue Perfusion: Goal: Adequacy of tissue perfusion will improve Outcome: Progressing

## 2024-09-11 NOTE — Plan of Care (Signed)
°  Problem: Education: Goal: Knowledge of risk factors and measures for prevention of condition will improve Outcome: Adequate for Discharge   Problem: Coping: Goal: Psychosocial and spiritual needs will be supported Outcome: Adequate for Discharge   Problem: Respiratory: Goal: Will maintain a patent airway Outcome: Adequate for Discharge Goal: Complications related to the disease process, condition or treatment will be avoided or minimized Outcome: Adequate for Discharge   Problem: Education: Goal: Knowledge of General Education information will improve Description: Including pain rating scale, medication(s)/side effects and non-pharmacologic comfort measures Outcome: Adequate for Discharge   Problem: Health Behavior/Discharge Planning: Goal: Ability to manage health-related needs will improve Outcome: Adequate for Discharge   Problem: Clinical Measurements: Goal: Ability to maintain clinical measurements within normal limits will improve Outcome: Adequate for Discharge Goal: Will remain free from infection Outcome: Adequate for Discharge Goal: Diagnostic test results will improve Outcome: Adequate for Discharge Goal: Respiratory complications will improve Outcome: Adequate for Discharge Goal: Cardiovascular complication will be avoided Outcome: Adequate for Discharge   Problem: Activity: Goal: Risk for activity intolerance will decrease Outcome: Adequate for Discharge   Problem: Nutrition: Goal: Adequate nutrition will be maintained Outcome: Adequate for Discharge   Problem: Coping: Goal: Level of anxiety will decrease Outcome: Adequate for Discharge   Problem: Elimination: Goal: Will not experience complications related to bowel motility Outcome: Adequate for Discharge Goal: Will not experience complications related to urinary retention Outcome: Adequate for Discharge   Problem: Pain Managment: Goal: General experience of comfort will improve and/or be  controlled Outcome: Adequate for Discharge   Problem: Safety: Goal: Ability to remain free from injury will improve Outcome: Adequate for Discharge   Problem: Skin Integrity: Goal: Risk for impaired skin integrity will decrease Outcome: Adequate for Discharge   Problem: Activity: Goal: Ability to tolerate increased activity will improve Outcome: Adequate for Discharge   Problem: Clinical Measurements: Goal: Ability to maintain a body temperature in the normal range will improve Outcome: Adequate for Discharge   Problem: Respiratory: Goal: Ability to maintain adequate ventilation will improve Outcome: Adequate for Discharge Goal: Ability to maintain a clear airway will improve Outcome: Adequate for Discharge   Problem: Education: Goal: Ability to describe self-care measures that may prevent or decrease complications (Diabetes Survival Skills Education) will improve Outcome: Adequate for Discharge Goal: Individualized Educational Video(s) Outcome: Adequate for Discharge   Problem: Coping: Goal: Ability to adjust to condition or change in health will improve Outcome: Adequate for Discharge   Problem: Fluid Volume: Goal: Ability to maintain a balanced intake and output will improve Outcome: Adequate for Discharge   Problem: Health Behavior/Discharge Planning: Goal: Ability to identify and utilize available resources and services will improve Outcome: Adequate for Discharge Goal: Ability to manage health-related needs will improve Outcome: Adequate for Discharge   Problem: Metabolic: Goal: Ability to maintain appropriate glucose levels will improve Outcome: Adequate for Discharge   Problem: Nutritional: Goal: Maintenance of adequate nutrition will improve Outcome: Adequate for Discharge Goal: Progress toward achieving an optimal weight will improve Outcome: Adequate for Discharge   Problem: Skin Integrity: Goal: Risk for impaired skin integrity will  decrease Outcome: Adequate for Discharge   Problem: Tissue Perfusion: Goal: Adequacy of tissue perfusion will improve Outcome: Adequate for Discharge

## 2024-09-12 ENCOUNTER — Telehealth: Payer: Self-pay

## 2024-09-12 LAB — CALCITRIOL (1,25 DI-OH VIT D): Vit D, 1,25-Dihydroxy: 12.6 pg/mL — ABNORMAL LOW (ref 24.8–81.5)

## 2024-09-12 LAB — LEGIONELLA PNEUMOPHILA SEROGP 1 UR AG: L. pneumophila Serogp 1 Ur Ag: NEGATIVE

## 2024-09-12 NOTE — Transitions of Care (Post Inpatient/ED Visit) (Signed)
" ° °  09/12/2024  Name: Luis Wood MRN: [id number] DOB: 07/09/1971  Today's TOC FU Call Status: Today's TOC FU Call Status:: Unsuccessful Call (2nd Attempt) Unsuccessful Call (2nd Attempt) Date: 09/12/24  Attempted to reach the patient regarding the most recent Inpatient/ED visit.  Follow Up Plan: Additional outreach attempts will be made to reach the patient to complete the Transitions of Care (Post Inpatient/ED visit) call.   Alan Ee, RN, BSN, CEN Population Health- Transition of Care Team.  [facility] [phone]  "

## 2024-09-12 NOTE — Transitions of Care (Post Inpatient/ED Visit) (Signed)
" ° °  09/12/2024  Name: MALONE VANBLARCOM MRN: [id number] DOB: 1971/02/21  Today's TOC FU Call Status: Today's TOC FU Call Status:: Unsuccessful Call (1st Attempt) Unsuccessful Call (1st Attempt) Date: 09/12/24  Attempted to reach the patient regarding the most recent Inpatient/ED visit.  Follow Up Plan: Additional outreach attempts will be made to reach the patient to complete the Transitions of Care (Post Inpatient/ED visit) call.   Alan Ee, RN, BSN, CEN Population Health- Transition of Care Team.  [facility] [phone]  "

## 2024-09-15 ENCOUNTER — Telehealth: Payer: Self-pay | Admitting: *Deleted

## 2024-09-15 LAB — CULTURE, BLOOD (ROUTINE X 2): Special Requests: ADEQUATE

## 2024-09-15 NOTE — Transitions of Care (Post Inpatient/ED Visit) (Signed)
" ° °  09/15/2024  Name: Luis Wood MRN: [id number] DOB: 07/04/1971  Today's TOC FU Call Status: Today's TOC FU Call Status:: Unsuccessful Call (3rd Attempt) Unsuccessful Call (3rd Attempt) Date: 09/15/24  Attempted to reach the patient regarding the most recent Inpatient/ED visit.  Follow Up Plan: No further outreach attempts will be made at this time. We have been unable to contact the patient.  Mliss Creed [facility], BSN RN Care Manager/ Transition of Care [place] [phone]  "

## 2024-09-16 LAB — CULTURE, BLOOD (ROUTINE X 2): Culture  Setup Time: NO GROWTH

## 2024-09-30 ENCOUNTER — Other Ambulatory Visit (HOSPITAL_COMMUNITY): Payer: Self-pay

## 2024-09-30 ENCOUNTER — Other Ambulatory Visit: Payer: Self-pay

## 2024-09-30 MED ORDER — CALCITRIOL 0.25 MCG PO CAPS
0.2500 ug | ORAL_CAPSULE | Freq: Every day | ORAL | 6 refills | Status: AC
  Filled 2024-09-30: qty 30, 30d supply, fill #0

## 2024-10-06 ENCOUNTER — Telehealth (HOSPITAL_COMMUNITY): Payer: Self-pay | Admitting: Pharmacy Technician

## 2024-10-06 ENCOUNTER — Other Ambulatory Visit (HOSPITAL_COMMUNITY): Payer: Self-pay | Admitting: Nephrology

## 2024-10-06 ENCOUNTER — Telehealth: Payer: Self-pay | Admitting: Pharmacy Technician

## 2024-10-06 DIAGNOSIS — D509 Iron deficiency anemia, unspecified: Secondary | ICD-10-CM | POA: Insufficient documentation

## 2024-10-06 NOTE — Telephone Encounter (Signed)
 Auth Submission: PENDING Site of care: [facility] Payer: [employer] [facility]) Medication & CPT/J Code(s) submitted: V4893 - RETACRIT NON-ESRD  Diagnosis Code: N18.9, D63.1 Route of submission (phone, fax, portal): AVAILITY PORTAL Phone # Fax # Auth type: Buy/Bill HB Units/visits requested: 20,000 UNITS EVERY 2 WEEKS Reference number: [id number] Approval from: *** to ***    Dagoberto Armour, CPhT [facility] Phone: [phone] 10/06/2024

## 2024-10-06 NOTE — Telephone Encounter (Addendum)
 Devki, the patient will be scheduled as soon as possible.   Site of care has changed to [facility].  New site-specific shara will need to be created.

## 2024-10-06 NOTE — Progress Notes (Signed)
 Received referral for IV Venofer  200mg  x 3 doses with no premeds  Sherry Pennant, PharmD, MPH, BCPS, CPP Clinical Pharmacist

## 2024-10-06 NOTE — Progress Notes (Signed)
 Received Retacrit orders  Dose: 20000 units SQ q 2 weeks  Labs: iron  panel and ferritin q 4 weeks; POCT Hgb at each visit  Sherry Pennant, PharmD, MPH, BCPS, CPP Clinical Pharmacist

## 2024-10-06 NOTE — Telephone Encounter (Signed)
 Auth Submission: NO AUTH NEEDED Site of care: [facility] Payer: [employer] [facility] Medication & CPT/J Code(s) submitted: Venofer  (Iron  Sucrose) J1756 Diagnosis Code: D50.9, N18.5, D63.1 Route of submission (phone, fax, portal): CHECKED [facility]ILITY Phone # Fax # Auth type: Buy/Bill HB Units/visits requested: 200MG  X 3 DOSES Reference number:  Approval from: 10/06/2024 to 01/04/25      Dagoberto Armour, CPhT [facility] Phone: [phone] 10/06/2024

## 2024-10-08 ENCOUNTER — Other Ambulatory Visit (HOSPITAL_COMMUNITY): Payer: Self-pay | Admitting: Nephrology

## 2024-10-08 NOTE — Progress Notes (Signed)
 Received referral fr Procit from [facility] with note that is an order change from Retacrit based on insurance. Dagoberto notified via secure chat as we were not aware that Retacrit had been or was going to get denied  She will initiate BIV for Procrit now  Sherry Pennant, PharmD, MPH, BCPS, CPP Clinical Pharmacist

## 2024-10-10 ENCOUNTER — Other Ambulatory Visit: Payer: Self-pay | Admitting: Physician Assistant

## 2024-10-13 ENCOUNTER — Encounter (HOSPITAL_COMMUNITY): Payer: Self-pay

## 2024-10-13 ENCOUNTER — Other Ambulatory Visit (HOSPITAL_COMMUNITY): Payer: Self-pay

## 2024-10-13 ENCOUNTER — Other Ambulatory Visit: Payer: Self-pay | Admitting: Physician Assistant

## 2024-10-15 ENCOUNTER — Other Ambulatory Visit (HOSPITAL_COMMUNITY): Payer: Self-pay

## 2024-10-16 ENCOUNTER — Other Ambulatory Visit (HOSPITAL_COMMUNITY): Payer: Self-pay

## 2024-10-17 ENCOUNTER — Ambulatory Visit (HOSPITAL_COMMUNITY)
Admission: RE | Admit: 2024-10-17 | Discharge: 2024-10-17 | Disposition: A | Source: Ambulatory Visit | Attending: Nephrology

## 2024-10-17 VITALS — BP 154/80 | HR 91 | Temp 98.3°F | Resp 17

## 2024-10-17 VITALS — BP 172/86 | HR 91 | Resp 16

## 2024-10-17 DIAGNOSIS — D509 Iron deficiency anemia, unspecified: Secondary | ICD-10-CM | POA: Insufficient documentation

## 2024-10-17 DIAGNOSIS — D631 Anemia in chronic kidney disease: Secondary | ICD-10-CM

## 2024-10-17 DIAGNOSIS — N185 Chronic kidney disease, stage 5: Secondary | ICD-10-CM | POA: Insufficient documentation

## 2024-10-17 LAB — POCT HEMOGLOBIN-HEMACUE: Hemoglobin: 7.7 g/dL — ABNORMAL LOW (ref 13.0–17.0)

## 2024-10-17 MED ORDER — EPOETIN ALFA 20000 UNIT/ML IJ SOLN
20000.0000 [IU] | Freq: Once | INTRAMUSCULAR | Status: AC
  Administered 2024-10-17: 20000 [IU] via SUBCUTANEOUS

## 2024-10-17 MED ORDER — EPOETIN ALFA 20000 UNIT/ML IJ SOLN
INTRAMUSCULAR | Status: AC
  Filled 2024-10-17: qty 1

## 2024-10-17 MED ORDER — IRON SUCROSE 200 MG IVPB - SIMPLE MED
Status: AC
  Filled 2024-10-17: qty 110

## 2024-10-17 MED ORDER — IRON SUCROSE 200 MG IVPB - SIMPLE MED
200.0000 mg | Freq: Once | Status: AC
  Administered 2024-10-17: 200 mg via INTRAVENOUS

## 2024-10-22 ENCOUNTER — Telehealth: Payer: Self-pay | Admitting: *Deleted

## 2024-10-22 ENCOUNTER — Other Ambulatory Visit: Payer: Self-pay | Admitting: Physician Assistant

## 2024-10-22 MED ORDER — OZEMPIC (0.25 OR 0.5 MG/DOSE) 2 MG/3ML ~~LOC~~ SOPN: 0.2500 mg | PEN_INJECTOR | SUBCUTANEOUS | 1 refills | Status: AC

## 2024-10-22 NOTE — Telephone Encounter (Signed)
 Copied from CRM [id number]. 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 Patient leaves for training tomorrow 1/28 around 1pm. Please call patient when note is ready for pick up.

## 2024-10-22 NOTE — Telephone Encounter (Signed)
 Copied from CRM #[id number]. Topic: Clinical - Medication Question >> Oct 21, 2024  3:48 PM Alexandria E wrote: Reason for CRM: Patient's Ozempic  was refused for refill as he needs an OV. Got patient scheduled for 2/17 for refills, but patient is currently out of this medication. Questioning if PCP would be able to bridge-fill until he gets seen in February. Please call / send MyChart message to patient if this is doable.

## 2024-10-22 NOTE — Telephone Encounter (Signed)
 Spoke to pt told him I will refill his Ozempic , but you must keep your appt on 11/11/2024. Pt verbalized understanding. Rx sent to pharmacy.

## 2024-10-22 NOTE — Telephone Encounter (Signed)
 Spoke to pt told him he will need to contact who ordered his iron  infusions for a letter. Pt verbalized understanding.

## 2024-10-23 ENCOUNTER — Telehealth: Payer: Self-pay

## 2024-10-23 ENCOUNTER — Telehealth: Payer: Self-pay | Admitting: *Deleted

## 2024-10-23 ENCOUNTER — Other Ambulatory Visit (HOSPITAL_COMMUNITY): Payer: Self-pay

## 2024-10-23 NOTE — Telephone Encounter (Signed)
 Please do PA for Ozempic  0.25 mg weekly injection. Pt is Diabetic and Hypertension.

## 2024-10-23 NOTE — Telephone Encounter (Signed)
 Called [facility] pharmacy spoke to pharmacist told him calling about pt's Ozempic , PA is approved til 05/2026. Pharmacist said yes, they figured it out and Rx is ready for pickup.   Left message on voicemail that Rx is ready for pickup at the pharmacy.

## 2024-10-23 NOTE — Telephone Encounter (Signed)
 Pharmacy Patient Advocate Encounter   Received notification from Pt Calls Messages that prior authorization for Ozempic  0.25MG - 2MG /3ML Pen injectors is required/requested.   Insurance verification completed.   The patient is insured through [facility].   Per test claim: The current 28 day co-pay is, $49.99.  No PA needed at this time. This test claim was processed through [facility]- copay amounts may vary at other pharmacies due to pharmacy/plan contracts, or as the patient moves through the different stages of their insurance plan.

## 2024-11-11 ENCOUNTER — Encounter (HOSPITAL_COMMUNITY)

## 2024-11-11 ENCOUNTER — Ambulatory Visit: Admitting: Physician Assistant

## 2024-11-25 ENCOUNTER — Encounter (HOSPITAL_COMMUNITY)
